# Patient Record
Sex: Female | Born: 1935 | Race: Black or African American | Hispanic: No | Smoking: Never smoker
Health system: Southern US, Community
[De-identification: ages and names within clinical notes are randomized; demographics above are authoritative.]

## PROBLEM LIST (undated history)

## (undated) DIAGNOSIS — I350 Nonrheumatic aortic (valve) stenosis: Secondary | ICD-10-CM

## (undated) DIAGNOSIS — I272 Pulmonary hypertension, unspecified: Secondary | ICD-10-CM

## (undated) DIAGNOSIS — N184 Chronic kidney disease, stage 4 (severe): Secondary | ICD-10-CM

## (undated) DIAGNOSIS — K922 Gastrointestinal hemorrhage, unspecified: Secondary | ICD-10-CM

## (undated) DIAGNOSIS — D649 Anemia, unspecified: Secondary | ICD-10-CM

## (undated) DIAGNOSIS — I5032 Chronic diastolic (congestive) heart failure: Secondary | ICD-10-CM

## (undated) DIAGNOSIS — R188 Other ascites: Secondary | ICD-10-CM

## (undated) DIAGNOSIS — M1990 Unspecified osteoarthritis, unspecified site: Secondary | ICD-10-CM

## (undated) DIAGNOSIS — I319 Disease of pericardium, unspecified: Secondary | ICD-10-CM

## (undated) DIAGNOSIS — R001 Bradycardia, unspecified: Secondary | ICD-10-CM

## (undated) DIAGNOSIS — I4891 Unspecified atrial fibrillation: Secondary | ICD-10-CM

## (undated) DIAGNOSIS — I38 Endocarditis, valve unspecified: Secondary | ICD-10-CM

## (undated) DIAGNOSIS — I10 Essential (primary) hypertension: Secondary | ICD-10-CM

## (undated) DIAGNOSIS — M810 Age-related osteoporosis without current pathological fracture: Secondary | ICD-10-CM

## (undated) HISTORY — DX: Unspecified osteoarthritis, unspecified site: M19.90

## (undated) HISTORY — PX: CATARACT EXTRACTION, BILATERAL: SHX1313

## (undated) HISTORY — DX: Chronic diastolic (congestive) heart failure: I50.32

## (undated) HISTORY — DX: Nonrheumatic aortic (valve) stenosis: I35.0

## (undated) HISTORY — DX: Pulmonary hypertension, unspecified: I27.20

## (undated) HISTORY — DX: Anemia, unspecified: D64.9

## (undated) HISTORY — DX: Bradycardia, unspecified: R00.1

## (undated) HISTORY — PX: VARICOSE VEIN SURGERY: SHX832

---

## 1997-07-01 ENCOUNTER — Other Ambulatory Visit: Admission: RE | Admit: 1997-07-01 | Discharge: 1997-07-01 | Payer: Self-pay | Admitting: Family Medicine

## 1999-10-11 ENCOUNTER — Encounter: Payer: Self-pay | Admitting: Family Medicine

## 1999-10-11 ENCOUNTER — Ambulatory Visit (HOSPITAL_COMMUNITY): Admission: RE | Admit: 1999-10-11 | Discharge: 1999-10-11 | Payer: Self-pay | Admitting: Family Medicine

## 2000-08-02 ENCOUNTER — Encounter: Admission: RE | Admit: 2000-08-02 | Discharge: 2000-10-31 | Payer: Self-pay | Admitting: Internal Medicine

## 2000-08-23 ENCOUNTER — Other Ambulatory Visit: Admission: RE | Admit: 2000-08-23 | Discharge: 2000-08-23 | Payer: Self-pay | Admitting: Internal Medicine

## 2000-10-11 ENCOUNTER — Ambulatory Visit (HOSPITAL_COMMUNITY): Admission: RE | Admit: 2000-10-11 | Discharge: 2000-10-11 | Payer: Self-pay | Admitting: Internal Medicine

## 2000-10-11 ENCOUNTER — Encounter: Payer: Self-pay | Admitting: Internal Medicine

## 2001-03-24 ENCOUNTER — Encounter: Admission: RE | Admit: 2001-03-24 | Discharge: 2001-06-22 | Payer: Self-pay | Admitting: Internal Medicine

## 2001-07-24 ENCOUNTER — Encounter: Admission: RE | Admit: 2001-07-24 | Discharge: 2001-10-22 | Payer: Self-pay | Admitting: Internal Medicine

## 2001-10-23 ENCOUNTER — Encounter: Admission: RE | Admit: 2001-10-23 | Discharge: 2002-01-13 | Payer: Self-pay | Admitting: Internal Medicine

## 2001-11-03 ENCOUNTER — Encounter: Payer: Self-pay | Admitting: Internal Medicine

## 2001-11-03 ENCOUNTER — Ambulatory Visit (HOSPITAL_COMMUNITY): Admission: RE | Admit: 2001-11-03 | Discharge: 2001-11-03 | Payer: Self-pay | Admitting: Internal Medicine

## 2002-05-14 ENCOUNTER — Encounter: Admission: RE | Admit: 2002-05-14 | Discharge: 2002-08-12 | Payer: Self-pay | Admitting: Internal Medicine

## 2002-11-11 ENCOUNTER — Ambulatory Visit (HOSPITAL_COMMUNITY): Admission: RE | Admit: 2002-11-11 | Discharge: 2002-11-11 | Payer: Self-pay | Admitting: Hematology and Oncology

## 2002-11-11 ENCOUNTER — Encounter: Payer: Self-pay | Admitting: Internal Medicine

## 2002-12-11 ENCOUNTER — Encounter: Payer: Self-pay | Admitting: Internal Medicine

## 2002-12-11 ENCOUNTER — Encounter: Admission: RE | Admit: 2002-12-11 | Discharge: 2002-12-11 | Payer: Self-pay | Admitting: Internal Medicine

## 2003-04-03 DIAGNOSIS — M810 Age-related osteoporosis without current pathological fracture: Secondary | ICD-10-CM

## 2003-04-03 DIAGNOSIS — D649 Anemia, unspecified: Secondary | ICD-10-CM

## 2003-04-03 HISTORY — DX: Age-related osteoporosis without current pathological fracture: M81.0

## 2003-04-03 HISTORY — DX: Anemia, unspecified: D64.9

## 2003-09-17 ENCOUNTER — Encounter: Admission: RE | Admit: 2003-09-17 | Discharge: 2003-09-17 | Payer: Self-pay | Admitting: Internal Medicine

## 2003-12-02 ENCOUNTER — Ambulatory Visit (HOSPITAL_COMMUNITY): Admission: RE | Admit: 2003-12-02 | Discharge: 2003-12-02 | Payer: Self-pay | Admitting: Internal Medicine

## 2004-03-02 DIAGNOSIS — I319 Disease of pericardium, unspecified: Secondary | ICD-10-CM

## 2004-03-02 HISTORY — DX: Disease of pericardium, unspecified: I31.9

## 2004-03-31 ENCOUNTER — Encounter (INDEPENDENT_AMBULATORY_CARE_PROVIDER_SITE_OTHER): Payer: Self-pay | Admitting: Cardiology

## 2004-03-31 ENCOUNTER — Inpatient Hospital Stay (HOSPITAL_COMMUNITY): Admission: EM | Admit: 2004-03-31 | Discharge: 2004-04-02 | Payer: Self-pay | Admitting: Emergency Medicine

## 2004-04-02 DIAGNOSIS — I38 Endocarditis, valve unspecified: Secondary | ICD-10-CM

## 2004-04-02 HISTORY — DX: Endocarditis, valve unspecified: I38

## 2004-12-13 ENCOUNTER — Ambulatory Visit (HOSPITAL_COMMUNITY): Admission: RE | Admit: 2004-12-13 | Discharge: 2004-12-13 | Payer: Self-pay | Admitting: Internal Medicine

## 2005-12-17 ENCOUNTER — Ambulatory Visit (HOSPITAL_COMMUNITY): Admission: RE | Admit: 2005-12-17 | Discharge: 2005-12-17 | Payer: Self-pay | Admitting: Internal Medicine

## 2006-12-19 ENCOUNTER — Ambulatory Visit (HOSPITAL_COMMUNITY): Admission: RE | Admit: 2006-12-19 | Discharge: 2006-12-19 | Payer: Self-pay | Admitting: Internal Medicine

## 2008-02-19 ENCOUNTER — Ambulatory Visit (HOSPITAL_COMMUNITY): Admission: RE | Admit: 2008-02-19 | Discharge: 2008-02-19 | Payer: Self-pay | Admitting: Internal Medicine

## 2009-02-22 ENCOUNTER — Ambulatory Visit (HOSPITAL_COMMUNITY): Admission: RE | Admit: 2009-02-22 | Discharge: 2009-02-22 | Payer: Self-pay | Admitting: Internal Medicine

## 2009-08-31 ENCOUNTER — Encounter (HOSPITAL_BASED_OUTPATIENT_CLINIC_OR_DEPARTMENT_OTHER): Admission: RE | Admit: 2009-08-31 | Discharge: 2009-11-24 | Payer: Self-pay | Admitting: Internal Medicine

## 2009-10-14 ENCOUNTER — Encounter: Admission: RE | Admit: 2009-10-14 | Discharge: 2009-10-14 | Payer: Self-pay | Admitting: Internal Medicine

## 2009-10-21 ENCOUNTER — Encounter: Admission: RE | Admit: 2009-10-21 | Discharge: 2009-10-21 | Payer: Self-pay | Admitting: Internal Medicine

## 2010-02-27 ENCOUNTER — Ambulatory Visit (HOSPITAL_COMMUNITY)
Admission: RE | Admit: 2010-02-27 | Discharge: 2010-02-27 | Payer: Self-pay | Source: Home / Self Care | Admitting: Internal Medicine

## 2010-07-25 ENCOUNTER — Ambulatory Visit
Admission: RE | Admit: 2010-07-25 | Discharge: 2010-07-25 | Disposition: A | Discharge status: Home / Self Care | Payer: Medicare Other | Source: Ambulatory Visit | Attending: Family Medicine | Admitting: Family Medicine

## 2010-07-25 ENCOUNTER — Other Ambulatory Visit: Payer: Self-pay | Admitting: Family Medicine

## 2010-07-25 DIAGNOSIS — R52 Pain, unspecified: Secondary | ICD-10-CM

## 2010-08-18 NOTE — Discharge Summary (Signed)
Meghan Russo, Meghan Russo NO.:  [id number]   MEDICAL RECORD NO.:  [id number]          PATIENT TYPE:  INP   LOCATION:  4735                         FACILITY:  [facility]   PHYSICIAN:  Nanetta Batty, M.D.   DATE OF BIRTH:  [birth year]   DATE OF ADMISSION:  03/31/2004  DATE OF DISCHARGE:  04/02/2004                                 DISCHARGE SUMMARY   ADMISSION DIAGNOSES:  1.  Chest pain.  2.  History of mild aortic stenosis and mild aortic insufficiency with mild      tricuspid regurgitation.  3.  Hypertension.  4.  Osteoporosis.   DISCHARGE DIAGNOSES:  1.  Pericarditis.  2.  History of mild aortic stenosis and mild aortic insufficiency with mild      tricuspid regurgitation.  3.  Hypertension.  4.  Osteoporosis.   PROCEDURE:  2-D echocardiogram.   BRIEF HISTORY:  The patient is an 77 year old black female who has a history  of valvular disease and hypertension but no other coronary artery disease.  She presented to the ER on 03/31/04 after having had some salad and had the  onset of chest pain.  It started in the right flank and went under her right  breast and extended into her sternal area.  She felt like it was indigestion  initially, but it remained constant, it increased with deep breaths.  There  was no radiation, no shortness of breath or nausea, no vomiting, no  diaphoresis.  She subsequently presented to the ER for evaluation.  She had  no chest discomfort prior to this the night before and no recent problems  with shortness of breath or dyspnea.   PAST MEDICAL HISTORY:  2-D echo 10/20/03 which showed an EF of 50-60%, mild  aortic stenosis, mild aortic insufficiency, and mild tricuspid  regurgitation.  She has never had a cardiac catheterization.  Cardiolite  study 11/02 was negative with some questionable anterior ischemia with an  ejection fraction of 61%.  She also has a history of hypertension,  osteoporosis but no surgeries.   CURRENT MEDICATIONS:  1.  Toprol XL 100 mg daily.  2.  Lisinopril/HCT 20/25 one daily.  3.  Diovan 320 mg daily.  4.  Aspirin 81 mg daily.  5.  Fosamax 70 mg weekly.   ALLERGIES:  None known.   SOCIAL HISTORY:  She lives with her 61 year old grandson, she does chores in  the house, cooks, cleans and drives.  She is very independent.   PHYSICAL EXAMINATION:  Negative except for a temperature of 99.5 to 100 and  a 4/6 systolic murmur at the right second intercostal space and 3/6 murmur  at the apex.   Her EKG on admission showed a sinus rhythm but she had ST elevations of  about 2 mm in leads 1, 2, 3, aVF, V1 through V6.  She was subsequently  admitted to rule out MI with concern that she had pericarditis.   HOSPITAL COURSE:  During her hospitalization she remained in sinus rhythm,  although she became very bradycardic, her low rate was in the mid 30s  and  she averaged heart rates between 40 and 60.  Her troponins were negative and  her CKs were negative.  Her TSH was normal at 0.0379.  A 2-D echo was  obtained which showed the aortic valve was mildly calcified, there was  reduced aortic valve leaflet excursion, findings were consistent with mild  aortic valve stenosis, there was moderate to mild aortic valvular  regurgitation, the mean transaortic valve gradient was 8.4 mmHg, estimated  aortic valve area was 2.197 cm, there was mild annular calcification and  mild mitral valvular regurgitation, left atrial size was normal limits.  The  ejection fraction was 65 to 75% with ventricular wall thickness moderately  increased and there was moderate asymmetric septal hypertrophy.  Because the  patient was thought to have pericarditis she was treated initially with  Indocin and this was converted to Motrin on 04/01/04.  By the following  morning, 04/02/04, her chest pain was completely resolved and her discomfort  with deep inspiration was gone.  She continued to have 2-3/6 systolic and  apex murmurs.  She was  mobilized, she was seen by Dr. Elsie Lincoln and it was his  opinion that she could be discharged home.  We will discharge her home on  her preadmission medications as before.  In addition, we have recommended  Motrin 200 mg, two q.6h. p.r.n.  She is to maintain light activity for 72  hours and then back to her normal activities, a low salt diet.  She is to  call Dr. Dorothyann Peng for followup and she will follow up with Dr. Allyson Sabal in  about 2 to 3 weeks.   Some labs were obtained to evaluate her pericarditis, including an ANA which  is not back yet.  Rheumatoid factor was less than 20 with the normal being 0-  20.  Sed rate was 35.  Her CBC showed a [place] count of 7000, hemoglobin 10,  hematocrit 30, and platelet count 172,000.  Urine culture was also obtained  and it grew out 2000 colonies which was considered insignificant.  UA on  admission was essentially negative.  TSH was 0.379.   CONDITION ON DISCHARGE:  Improved.      WDJ/MEDQ  D:  04/02/2004  T:  04/02/2004  Job:  [id number]   cc:   Candyce Churn. Allyne Gee, M.D.  [address]  [address]  [place]  [place] [address]  Fax: [phone]

## 2010-12-15 ENCOUNTER — Emergency Department (HOSPITAL_COMMUNITY)
Admission: EM | Admit: 2010-12-15 | Discharge: 2010-12-16 | Disposition: A | Discharge status: Home / Self Care | Payer: Medicare Other | Attending: Emergency Medicine | Admitting: Emergency Medicine

## 2010-12-15 DIAGNOSIS — H53139 Sudden visual loss, unspecified eye: Secondary | ICD-10-CM | POA: Insufficient documentation

## 2010-12-15 DIAGNOSIS — Z9889 Other specified postprocedural states: Secondary | ICD-10-CM | POA: Insufficient documentation

## 2010-12-15 DIAGNOSIS — I10 Essential (primary) hypertension: Secondary | ICD-10-CM | POA: Insufficient documentation

## 2011-02-27 ENCOUNTER — Other Ambulatory Visit (HOSPITAL_COMMUNITY): Payer: Self-pay | Admitting: Internal Medicine

## 2011-02-27 DIAGNOSIS — Z1231 Encounter for screening mammogram for malignant neoplasm of breast: Secondary | ICD-10-CM

## 2011-03-08 ENCOUNTER — Encounter (HOSPITAL_BASED_OUTPATIENT_CLINIC_OR_DEPARTMENT_OTHER): Payer: Medicare Other | Attending: Internal Medicine

## 2011-03-08 DIAGNOSIS — L97809 Non-pressure chronic ulcer of other part of unspecified lower leg with unspecified severity: Secondary | ICD-10-CM | POA: Insufficient documentation

## 2011-03-08 DIAGNOSIS — L97509 Non-pressure chronic ulcer of other part of unspecified foot with unspecified severity: Secondary | ICD-10-CM | POA: Insufficient documentation

## 2011-03-08 DIAGNOSIS — I872 Venous insufficiency (chronic) (peripheral): Secondary | ICD-10-CM | POA: Insufficient documentation

## 2011-03-08 NOTE — Progress Notes (Signed)
[facility]  NAME:  Meghan Russo, Meghan Russo NO.:  [id number]  MEDICAL RECORD NO.:  [id number]      DATE OF BIRTH:  [birth year]  PHYSICIAN:  Maxwell Caul, M.D. VISIT DATE:  03/08/2011                                  OFFICE VISIT   LOCATION:  [facility].  [name] is an 77 year old woman we actually know from previous visits to this clinic with venous stasis ulcerations.  She tells [place] she was reasonably well as far as wounds are concerned until 2 weeks ago when she noted opening of wound on her medial right leg as well as right lateral foot.  Both of these were nontraumatic.  She is not a diabetic. She has not had undue pain or fever.  She is here for our review of this.  She has had previous difficulties with venous stasis wounds, as mentioned, was last in this clinic 2 years ago.  I do not have these records.  She does not have a history of arterial insufficiency.  Her ABI measured today was 1 in this clinic.  On examination; temperature is 98.8, pulse 64, respirations 18, blood pressure is 114/64.  The wound on the right medial lower extremity is the most substantial measuring 1.2 x 1.7 x 0.3.  This had a tight adherent eschar.  This required debridement with topical lidocaine and a small curette.  The other area was on the right lateral foot measuring 1 x 0.1 x 0.1.  Underwent a similar nonexcisional debridement which she tolerated well.  There is no evidence of infection here.  No cultures were done.  IMPRESSION:  New-onset venous stasis wound as noted above.  The patient has a prior history of venous stasis wounds and has a history of vein surgery in 1993.  These wounds were debrided as noted above.  We applied Santyl, hydrogel, Adaptic foam under a Profore Lite dressing which she will leave on for a week.  We will see her again at that time.          ______________________________ Maxwell Caul,  M.D.     MGR/MEDQ  D:  03/08/2011  T:  03/08/2011  Job:  [id number]

## 2011-04-04 ENCOUNTER — Ambulatory Visit (HOSPITAL_COMMUNITY)
Admission: RE | Admit: 2011-04-04 | Discharge: 2011-04-04 | Disposition: A | Discharge status: Home / Self Care | Payer: Medicare Other | Source: Ambulatory Visit | Attending: Internal Medicine | Admitting: Internal Medicine

## 2011-04-04 DIAGNOSIS — Z1231 Encounter for screening mammogram for malignant neoplasm of breast: Secondary | ICD-10-CM

## 2011-04-05 ENCOUNTER — Encounter (HOSPITAL_BASED_OUTPATIENT_CLINIC_OR_DEPARTMENT_OTHER): Payer: Medicare Other | Attending: Internal Medicine

## 2011-04-05 DIAGNOSIS — L97509 Non-pressure chronic ulcer of other part of unspecified foot with unspecified severity: Secondary | ICD-10-CM | POA: Insufficient documentation

## 2011-04-05 DIAGNOSIS — L97809 Non-pressure chronic ulcer of other part of unspecified lower leg with unspecified severity: Secondary | ICD-10-CM | POA: Insufficient documentation

## 2011-04-05 DIAGNOSIS — I872 Venous insufficiency (chronic) (peripheral): Secondary | ICD-10-CM | POA: Insufficient documentation

## 2011-05-10 ENCOUNTER — Encounter (HOSPITAL_BASED_OUTPATIENT_CLINIC_OR_DEPARTMENT_OTHER): Payer: Medicare Other | Attending: Internal Medicine

## 2011-05-10 DIAGNOSIS — L98499 Non-pressure chronic ulcer of skin of other sites with unspecified severity: Secondary | ICD-10-CM | POA: Insufficient documentation

## 2011-05-10 DIAGNOSIS — I872 Venous insufficiency (chronic) (peripheral): Secondary | ICD-10-CM | POA: Insufficient documentation

## 2011-05-13 ENCOUNTER — Encounter (HOSPITAL_COMMUNITY): Payer: Self-pay

## 2011-05-13 ENCOUNTER — Emergency Department (INDEPENDENT_AMBULATORY_CARE_PROVIDER_SITE_OTHER)
Admission: EM | Admit: 2011-05-13 | Discharge: 2011-05-13 | Disposition: A | Discharge status: Home / Self Care | Payer: Medicare Other | Source: Home / Self Care

## 2011-05-13 DIAGNOSIS — B353 Tinea pedis: Secondary | ICD-10-CM

## 2011-05-13 HISTORY — DX: Essential (primary) hypertension: I10

## 2011-05-13 MED ORDER — TERBINAFINE HCL 250 MG PO TABS: 250.0000 mg | ORAL_TABLET | Freq: Every day | ORAL | Status: DC

## 2011-05-13 MED ORDER — CLOTRIMAZOLE-BETAMETHASONE 1-0.05 % EX CREA: TOPICAL_CREAM | CUTANEOUS | Status: DC

## 2011-05-13 NOTE — ED Notes (Signed)
Pt is being treated at the wound center for lower leg ulcers and on Thursday she had what she describes as a rash on both feet.  She now has weeping lesions on both feet, denies pain.

## 2011-05-13 NOTE — ED Provider Notes (Signed)
History     CSN: [id number]  Arrival date & time 05/13/11  1314   None     Chief Complaint  Patient presents with  . Rash    (Consider location/radiation/quality/duration/timing/severity/associated sxs/prior treatment) HPI Comments: Patient presents today with complaints of an itchy weeping rash to both feet. She is going to the wound clinic for ulcers of her lower extremities. They are wrapping the wounds. She has not taken any antibiotics. She states that she developed an itchy rash on the tops of her feet a few days ago. She showed this to the provider at the wound clinic who told her that it was either a rash or a fungal infection. Since this began it has spread and is now continuously weeping. No pain, or fever. She is not diabetic.    Past Medical History  Diagnosis Date  . Hypertension     History reviewed. No pertinent past surgical history.  History reviewed. No pertinent family history.  History  Substance Use Topics  . Smoking status: Never Smoker   . Smokeless tobacco: Not on file  . Alcohol Use: No    OB History    Grav Para Term Preterm Abortions TAB SAB Ect Mult Living                  Review of Systems  Constitutional: Negative for fever and chills.  Musculoskeletal: Negative for joint swelling.  Skin: Positive for rash.    Allergies  Review of patient's allergies indicates no known allergies.  Home Medications   Current Outpatient Rx  Name Route Sig Dispense Refill  . AMLODIPINE-OLMESARTAN 10-40 MG PO TABS Oral Take 1 tablet by mouth daily.    Marland Kitchen CARVEDILOL PHOSPHATE ER 20 MG PO CP24 Oral Take 20 mg by mouth daily.    . FUROSEMIDE 20 MG PO TABS Oral Take 20 mg by mouth 2 (two) times daily.    Marland Kitchen HYDRALAZINE HCL 25 MG PO TABS Oral Take 25 mg by mouth 3 (three) times daily.    Marland Kitchen LISINOPRIL-HYDROCHLOROTHIAZIDE 20-25 MG PO TABS Oral Take 1 tablet by mouth daily.    Marland Kitchen CLOTRIMAZOLE-BETAMETHASONE 1-0.05 % EX CREA  Apply to affected area 2 times daily  prn 15 g 0  . TERBINAFINE HCL 250 MG PO TABS Oral Take 1 tablet (250 mg total) by mouth daily. 7 tablet 0    BP 192/65  Pulse 83  Temp(Src) 98.3 F (36.8 C) (Oral)  Resp 18  SpO2 100%  Physical Exam  Nursing note and vitals reviewed. Constitutional: She appears well-developed and well-nourished. No distress.  Musculoskeletal: Normal range of motion. She exhibits no edema and no tenderness.       Bilat LE wrapped.   Neurological: Gait normal.  Skin: Skin is warm and dry.          Dorsal aspect of bilat feet near MTP joints, papular and scabbed, and weeping serous fluid. Interdigital spaces macerated also with serous fluid. Plantar aspect of feet clear.   Psychiatric: She has a normal mood and affect.    ED Course  Procedures (including critical care time)  Labs Reviewed - No data to display No results found.   1. Tinea pedis       MDM  Tinea infection of bilat feet. Also examined by Dr Artis Flock. And treatment plan discussed.          Melody Comas, [place] 05/13/11 [phone]

## 2011-05-14 NOTE — ED Provider Notes (Signed)
Medical screening examination/treatment/procedure(s) were performed by non-physician practitioner and as supervising physician I was immediately available for consultation/collaboration.   Anntoinette Haefele DOUGLAS MD.    Shawon Denzer Douglas Latoy Labriola, MD 05/14/11 1546 

## 2011-05-16 LAB — WOUND CULTURE: Gram Stain: NONE SEEN

## 2011-05-17 ENCOUNTER — Telehealth (HOSPITAL_COMMUNITY): Payer: Self-pay | Admitting: *Deleted

## 2011-05-17 NOTE — ED Notes (Signed)
Wound culture L foot: Abundant MRSA. Order obtained from [facility] PA for Septra DS #20 1 BID x 10 days. Pt. and daughter notified.  MRSA instructions given. Daughter wants Rx. Called to [facility] on [address] @ [phone].  Rx. called to voicemail after holding 5 min. and then hang up. Vassie Moselle 05/17/2011

## 2011-05-17 NOTE — ED Notes (Signed)
Pos. MRSA. Pt. verified, notified of results and given instructions. Rx. Septra DS called. Meghan Russo 05/17/2011

## 2011-06-07 ENCOUNTER — Encounter (HOSPITAL_BASED_OUTPATIENT_CLINIC_OR_DEPARTMENT_OTHER): Payer: Medicare Other | Attending: Internal Medicine

## 2011-06-07 DIAGNOSIS — I872 Venous insufficiency (chronic) (peripheral): Secondary | ICD-10-CM | POA: Insufficient documentation

## 2011-06-07 DIAGNOSIS — L98499 Non-pressure chronic ulcer of skin of other sites with unspecified severity: Secondary | ICD-10-CM | POA: Insufficient documentation

## 2011-06-14 ENCOUNTER — Emergency Department (INDEPENDENT_AMBULATORY_CARE_PROVIDER_SITE_OTHER): Payer: Medicare Other

## 2011-06-14 ENCOUNTER — Emergency Department (INDEPENDENT_AMBULATORY_CARE_PROVIDER_SITE_OTHER)
Admission: EM | Admit: 2011-06-14 | Discharge: 2011-06-14 | Disposition: A | Discharge status: Home / Self Care | Payer: Medicare Other | Source: Home / Self Care | Attending: Emergency Medicine | Admitting: Emergency Medicine

## 2011-06-14 ENCOUNTER — Encounter (HOSPITAL_COMMUNITY): Payer: Self-pay | Admitting: *Deleted

## 2011-06-14 DIAGNOSIS — S01512A Laceration without foreign body of oral cavity, initial encounter: Secondary | ICD-10-CM

## 2011-06-14 DIAGNOSIS — S0083XA Contusion of other part of head, initial encounter: Secondary | ICD-10-CM

## 2011-06-14 DIAGNOSIS — S1093XA Contusion of unspecified part of neck, initial encounter: Secondary | ICD-10-CM

## 2011-06-14 DIAGNOSIS — S01502A Unspecified open wound of oral cavity, initial encounter: Secondary | ICD-10-CM

## 2011-06-14 DIAGNOSIS — Z23 Encounter for immunization: Secondary | ICD-10-CM

## 2011-06-14 DIAGNOSIS — S0003XA Contusion of scalp, initial encounter: Secondary | ICD-10-CM

## 2011-06-14 MED ORDER — AMOXICILLIN 500 MG PO CAPS: 500.0000 mg | ORAL_CAPSULE | Freq: Three times a day (TID) | ORAL | Status: AC

## 2011-06-14 MED ORDER — TETANUS-DIPHTH-ACELL PERTUSSIS 5-2.5-18.5 LF-MCG/0.5 IM SUSP
0.5000 mL | Freq: Once | INTRAMUSCULAR | Status: AC
  Administered 2011-06-14: 0.5 mL via INTRAMUSCULAR

## 2011-06-14 MED ORDER — TETANUS-DIPHTH-ACELL PERTUSSIS 5-2.5-18.5 LF-MCG/0.5 IM SUSP
INTRAMUSCULAR | Status: AC
  Filled 2011-06-14: qty 0.5

## 2011-06-14 NOTE — ED Provider Notes (Signed)
Chief Complaint  Patient presents with  . Fall    History of Present Illness:  Meghan Russo is an 78 year old female who last night around midnight tripped and fell, striking her chin on the floor. There was no loss of consciousness. She states she is certain that this was just a trip and not a syncope episode. She remained conscious throughout the entire episode and has a good memory for what happened. She has some swelling and bruising over her chin and a small abrasion of the chin and a cut inside her mouth. This is the main extent of her injuries. She denies any headache or stiff neck. She thinks she might have injured her right arm somewhat also but right now she denies any pain and she has a full range of motion with no pain. She denies any injury to the chest, back, abdomen, or extremities otherwise. She denies any diplopia, blurred vision, bleeding from the nose or ears, paresthesias, numbness, weakness, or difficulty with speech, swallowing, or ambulation. No history of loss of consciousness or seizure activity. No nausea or vomiting. She denies any pain with opening or closing the mouth and has had no chipped, loose, or broken teeth.  Review of Systems:  Other than noted above, the patient denies any of the following symptoms: Systemic:  No fever or chills. Eye:  No eye pain, redness, diplopia or blurred vision ENT:  No bleeding from nose or ears.  No loose or broken teeth. Neck:  No pain or limited ROM. GI:  No nausea or vomiting. Neuro:  No loss of consciousness, seizure activity, numbness, tingling, or weakness.  PMFSH:  Past medical history, family history, social history, meds, and allergies were reviewed.  Physical Exam:   Vital signs:  BP 184/65  Pulse 94  Temp(Src) 98 F (36.7 C) (Oral)  Resp 20  SpO2 99% General:  Alert and oriented times 3.  In no distress. Eye:  PERRL, full EOMs.  Lids and conjunctivas normal. HEENT:  External exam reveals a bruise over the anterior  mandible mostly on the left and abrasion on the right. This is mildly tender to touch, but the TMJ areas are nontender in the jaw has full range of motion with no pain. Examine the inside of the mouth there is a 1 cm laceration which is gaping open slightly on the lower lip. No other intraoral lesions and no chipped, loose, or broken teeth.  TMs and canals normal, nasal mucosa normal.  No oral lacerations.  Teeth were intact without obvious oral trauma. Neck:  Non tender.  Full ROM without pain. Neurological:  Alert and oriented.  Cranial nerves intact.  No pronator drift.  No muscle weakness.  Sensation was intact to light touch. Gait was normal.  Procedure: Verbal informed consent was obtained.  The patient was informed of the risks and benefits of the procedure and understands and accepts.  Identity of the patient was verified verbally and by wristband.   The laceration area described above was anesthetized with 3 mL of 2% Xylocaine with epinephrine.  The wound was then closed as follows:  The edges were loosely approximated with 3 4-0 Vicryl Rapide sutures.  There were no immediate complications, and the patient tolerated the procedure well. The laceration was then cleansed, Bacitracin ointment was applied to the abrasion of the chin and a clean, dry pressure dressing was put on the abrasion on the chin, the laceration and the mouth was just left open.  Dg Mandible 1-3 Views  06/14/2011  *RADIOLOGY REPORT*  Clinical Data: Pain after fall  MANDIBLE - 1-3 VIEW  Comparison: None.  Findings: There is no evidence of mandibular fracture or dislocation.  IMPRESSION: Negative mandible.  Original Report Authenticated By: Brandon Melnick, M.D.    Medications given in [facility]:  She was given a DTaP vaccine and tolerated this well without any immediate side effects or reactions.  Assessment:   Diagnoses that have been ruled out:  None  Diagnoses that are still under consideration:  None  Final diagnoses:    Laceration of oral cavity  Contusion of jaw    Plan:   1.  The following meds were prescribed:   New Prescriptions   AMOXICILLIN (AMOXIL) 500 MG CAPSULE    Take 1 capsule (500 mg total) by mouth 3 (three) times daily.   2.  The patient was instructed in wound care and pain control, and handouts were given. 3.  The patient was told that the stitches should come out on their own and he would not need to be removed. She was instructed in wound care and should return if there is any sign of wound infection.    Reuben Likes, MD 06/14/11 [phone]

## 2011-06-14 NOTE — ED Notes (Signed)
Pt is here post fall last night.  Pt hit her chin.  Has a wounds to outer chin and cut mark from teeth inside bottom lip.  Area is swollen and bruised.  Pt states she tripped.

## 2011-06-14 NOTE — Discharge Instructions (Signed)
Absorbable Suture Repair Absorbable sutures (stitches) hold skin together so you can heal. Keep skin wounds clean and dry for the next 2 to 3 days. Then, you may gently wash your wound and dress it with an antibiotic ointment as recommended. As your wound begins to heal, the sutures are no longer needed, and they typically begin to fall off. This will take 7 to 10 days. After 10 days, if your sutures are loose, you can remove them by wiping with a clean gauze pad or a cotton ball. Do not pull your sutures out. They should wipe away easily. If after 10 days they do not easily wipe away, have your caregiver take them out. Absorbable sutures may be used deep in a wound to help hold it together. If these stitches are below the skin, the body will absorb them completely in 3 to 4 weeks.  You may need a tetanus shot if:  You cannot remember when you had your last tetanus shot.   You have never had a tetanus shot.  If you get a tetanus shot, your arm may swell, get red, and feel warm to the touch. This is common and not a problem. If you need a tetanus shot and you choose not to have one, there is a rare chance of getting tetanus. Sickness from tetanus can be serious. SEEK IMMEDIATE MEDICAL CARE IF:  You have redness in the wound area.   The wound area feels hot to the touch.   You develop swelling in the wound area.   You develop pain.   There is fluid drainage from the wound.  Document Released: 04/26/2004 Document Revised: 03/08/2011 Document Reviewed: 08/08/2010 [facility] Patient Information 2012 [place], [place].Contusion A contusion is a deep bruise. Contusions are the result of an injury that caused bleeding under the skin. The contusion may turn blue, purple, or yellow. Minor injuries will give you a painless contusion, but more severe contusions may stay painful and swollen for a few weeks.  CAUSES  A contusion is usually caused by a blow, trauma, or direct force to an area of the  body. SYMPTOMS   Swelling and redness of the injured area.   Bruising of the injured area.   Tenderness and soreness of the injured area.   Pain.  DIAGNOSIS  The diagnosis can be made by taking a history and physical exam. An X-ray, CT scan, or MRI may be needed to determine if there were any associated injuries, such as fractures. TREATMENT  Specific treatment will depend on what area of the body was injured. In general, the best treatment for a contusion is resting, icing, elevating, and applying cold compresses to the injured area. Over-the-counter medicines may also be recommended for pain control. Ask your caregiver what the best treatment is for your contusion. HOME CARE INSTRUCTIONS   Put ice on the injured area.   Put ice in a plastic bag.   Place a towel between your skin and the bag.   Leave the ice on for 15 to 20 minutes, 3 to 4 times a day.   Only take over-the-counter or prescription medicines for pain, discomfort, or fever as directed by your caregiver. Your caregiver may recommend avoiding anti-inflammatory medicines (aspirin, ibuprofen, and naproxen) for 48 hours because these medicines may increase bruising.   Rest the injured area.   If possible, elevate the injured area to reduce swelling.  SEEK IMMEDIATE MEDICAL CARE IF:   You have increased bruising or swelling.   You  have pain that is getting worse.   Your swelling or pain is not relieved with medicines.  MAKE SURE YOU:   Understand these instructions.   Will watch your condition.   Will get help right away if you are not doing well or get worse.  Document Released: 12/27/2004 Document Revised: 03/08/2011 Document Reviewed: 01/22/2011 [facility] Patient Information 2012 [place], [place].    Rinse out mouth with warm salt water (1/2 tsp of table salt in 8 oz warm water) 3 times daily.

## 2011-07-18 ENCOUNTER — Encounter (HOSPITAL_COMMUNITY): Payer: Self-pay | Admitting: Emergency Medicine

## 2011-07-18 ENCOUNTER — Emergency Department (INDEPENDENT_AMBULATORY_CARE_PROVIDER_SITE_OTHER)
Admission: EM | Admit: 2011-07-18 | Discharge: 2011-07-18 | Disposition: A | Discharge status: Home / Self Care | Payer: Medicare Other | Source: Home / Self Care | Attending: Emergency Medicine | Admitting: Emergency Medicine

## 2011-07-18 DIAGNOSIS — M1710 Unilateral primary osteoarthritis, unspecified knee: Secondary | ICD-10-CM

## 2011-07-18 MED ORDER — MELOXICAM 7.5 MG PO TABS: 7.5000 mg | ORAL_TABLET | Freq: Every day | ORAL | Status: DC

## 2011-07-18 NOTE — ED Provider Notes (Signed)
History     CSN: [id number]  Arrival date & time 07/18/11  6295   First MD Initiated Contact with Patient 07/18/11 1210      Chief Complaint  Patient presents with  . Joint Swelling    (Consider location/radiation/quality/duration/timing/severity/associated sxs/prior treatment) HPI Comments: Patient is accompanied by her daughter which describes mainly her symptoms and changes. She has been complaining that both of her knees have been hurting more and during the course of the day they become swollen. She has not sustained any falls or injuries and usually describes that the swelling is more obvious in the course of the day and as she lays down at night and during the morning the swelling seemed to have improved. Have not brought this to the attention of her Dr. so far. Denies any recent falls injuries, fevers or lower extremity weakness.  Patient is a 78 y.o. female presenting with knee pain. The history is provided by the patient and a relative.  Knee Pain This is a recurrent problem. The current episode started more than 1 week ago. The problem occurs constantly. Pertinent negatives include no abdominal pain. The symptoms are aggravated by walking, stress and standing. The symptoms are relieved by nothing.    Past Medical History  Diagnosis Date  . Hypertension     History reviewed. No pertinent past surgical history.  History reviewed. No pertinent family history.  History  Substance Use Topics  . Smoking status: Never Smoker   . Smokeless tobacco: Not on file  . Alcohol Use: No    OB History    Grav Para Term Preterm Abortions TAB SAB Ect Mult Living                  Review of Systems  Constitutional: Positive for activity change. Negative for fever, fatigue and unexpected weight change.  Gastrointestinal: Negative for abdominal pain.  Musculoskeletal: Positive for joint swelling and gait problem. Negative for back pain and arthralgias.    Allergies  Review of  patient's allergies indicates no known allergies.  Home Medications   Current Outpatient Rx  Name Route Sig Dispense Refill  . ASPIRIN 81 MG PO TABS Oral Take 81 mg by mouth daily.    Marland Kitchen AMLODIPINE-OLMESARTAN 10-40 MG PO TABS Oral Take 1 tablet by mouth daily.    Marland Kitchen CARVEDILOL PHOSPHATE ER 20 MG PO CP24 Oral Take 20 mg by mouth daily.    Marland Kitchen CLOTRIMAZOLE-BETAMETHASONE 1-0.05 % EX CREA  Apply to affected area 2 times daily prn 15 g 0  . FUROSEMIDE 20 MG PO TABS Oral Take 20 mg by mouth 2 (two) times daily.    Marland Kitchen HYDRALAZINE HCL 25 MG PO TABS Oral Take 25 mg by mouth 3 (three) times daily.    Marland Kitchen LISINOPRIL-HYDROCHLOROTHIAZIDE 20-25 MG PO TABS Oral Take 1 tablet by mouth daily.    . TERBINAFINE HCL 250 MG PO TABS Oral Take 1 tablet (250 mg total) by mouth daily. 7 tablet 0    BP 197/60  Pulse 68  Temp(Src) 97.9 F (36.6 C) (Oral)  Resp 16  SpO2 98%  Physical Exam  Nursing note and vitals reviewed. Constitutional: No distress.  HENT:  Head: Normocephalic.  Eyes: Conjunctivae are normal.  Musculoskeletal: She exhibits tenderness.       Right knee: She exhibits swelling. She exhibits normal range of motion, no ecchymosis, no erythema, normal alignment, no LCL laxity, normal patellar mobility, no bony tenderness and no MCL laxity. tenderness found. Medial joint line,  lateral joint line and patellar tendon tenderness noted.       Left knee: She exhibits decreased range of motion and swelling. She exhibits no ecchymosis, no deformity, no laceration, no erythema, normal alignment, no LCL laxity, normal patellar mobility, no bony tenderness, normal meniscus and no MCL laxity. tenderness found. Medial joint line, lateral joint line and patellar tendon tenderness noted.  Skin: Skin is warm. No rash noted. She is not diaphoretic. No erythema.    ED Course  Procedures (including critical care time)  Labs Reviewed - No data to display No results found.   No diagnosis found.    MDM  Bilateral  knee pain in swelling for about 2 weeks. Swelling is reported as in term attempt progressive during the day improved in the mornings. Some discomfort with weight-bearing activities and long walks. Have encouraged patient to talk to primary care Dr. to consider a cycle of physical therapy. Most likely changes related to degenerative changes. No acute injury, nor exam suggest a acute fracture, dislocation or significant care. On patient's exam knee seem to be pretty stable and with no laxity concerns. No signs of septic knee or localize infection. Daughter accompanied mother during urgent care and counter. Have expressive explained to her that would be most ideal to discuss this further with her primary care Dr. to consider physical therapy and customize knee sleeves for mild compression        Jimmie Molly, MD 07/18/11 1219

## 2011-07-18 NOTE — ED Notes (Signed)
Pt c/o knee swelling starting about 2 weeks ago. She denies any pain or injury. Pt reports swelling is gone at night but appears every day and elevation does not help.

## 2011-07-18 NOTE — Discharge Instructions (Signed)
As discussed bring this to the attention of her primary care doctor as a physical therapy consult might be warranted. Because not to take any over-the-counter medicines if you take this medication for discomfort (only one week)Wear and Tear Disorders of the Knee (Arthritis, Osteoarthritis) Everyone will experience wear and tear injuries (arthritis, osteoarthritis) of the knee. These are the changes we all get as we age. They come from the joint stress of daily living. The amount of cartilage damage in your knee and your symptoms determine if you need surgery. Mild problems require approximately two months recovery time. More severe problems take several months to recover. With mild problems, your surgeon may find worn and rough cartilage surfaces. With severe changes, your surgeon may find cartilage that has completely worn away and exposed the bone. Loose bodies of bone and cartilage, bone spurs (excess bone growth), and injuries to the menisci (cushions between the large bones of your leg) are also common. All of these problems can cause pain. For a mild wear and tear problem, rough cartilage may simply need to be shaved and smoothed. For more severe problems with areas of exposed bone, your surgeon may use an instrument for roughing up the bone surfaces to stimulate new cartilage growth. Loose bodies are usually removed. Torn menisci may be trimmed or repaired. ABOUT THE ARTHROSCOPIC PROCEDURE Arthroscopy is a surgical technique. It allows your orthopedic surgeon to diagnose and treat your knee injury with accuracy. The surgeon looks into your knee through a small scope. The scope is like a small (pencil-sized) telescope. Arthroscopy is less invasive than open knee surgery. You can expect a more rapid recovery. After the procedure, you will be moved to a recovery area until most of the effects of the medication have worn off. Your caregiver will discuss the test results with you. RECOVERY The severity of  the arthritis and the type of procedure performed will determine recovery time. Other important factors include age, physical condition, medical conditions, and the type of rehabilitation program. Strengthening your muscles after arthroscopy helps guarantee a better recovery. Follow your caregiver's instructions. Use crutches, rest, elevate, ice, and do knee exercises as instructed. Your caregivers will help you and instruct you with exercises and other physical therapy required to regain your mobility, muscle strength, and functioning following surgery. Only take over-the-counter or prescription medicines for pain, discomfort, or fever as directed by your caregiver.  SEEK MEDICAL CARE IF:   There is increased bleeding (more than a small spot) from the wound.   You notice redness, swelling, or increasing pain in the wound.   Pus is coming from wound.   You develop an unexplained oral temperature above 102 F (38.9 C) , or as your caregiver suggests.   You notice a foul smell coming from the wound or dressing.   You have severe pain with motion of the knee.  SEEK IMMEDIATE MEDICAL CARE IF:   You develop a rash.   You have difficulty breathing.   You have any allergic problems.  MAKE SURE YOU:   Understand these instructions.   Will watch your condition.   Will get help right away if you are not doing well or get worse.  Document Released: 03/16/2000 Document Revised: 03/08/2011 Document Reviewed: 08/13/2007 [facility] Patient Information 2012 [place], [place].   Degenerative Arthritis You have osteoarthritis. This is the wear and tear arthritis that comes with aging. It is also called degenerative arthritis. This is common in people past middle age. It is caused by  stress on the joints. The large weight bearing joints of the lower extremities are most often affected. The knees, hips, back, neck, and hands can become painful, swollen, and stiff. This is the most common type of arthritis.  It comes on with age, carrying too much weight, or from an injury. Treatment includes resting the sore joint until the pain and swelling improve. Crutches or a walker may be needed for severe flares. Only take over-the-counter or prescription medicines for pain, discomfort, or fever as directed by your caregiver. Local heat therapy may improve motion. Cortisone shots into the joint are sometimes used to reduce pain and swelling during flares. Osteoarthritis is usually not crippling and progresses slowly. There are things you can do to decrease pain:  Avoid high impact activities.   Exercise regularly.   Low impact exercises such as walking, biking and swimming help to keep the muscles strong and keep normal joint function.   Stretching helps to keep your range of motion.   Lose weight if you are overweight. This reduces joint stress.  In severe cases when you have pain at rest or increasing disability, joint surgery may be helpful. See your caregiver for follow-up treatment as recommended.  SEEK IMMEDIATE MEDICAL CARE IF:   You have severe joint pain.   Marked swelling and redness in your joint develops.   You develop a high fever.  Document Released: 03/19/2005 Document Revised: 03/08/2011 Document Reviewed: 08/19/2006 [facility] Patient Information 2012 [place], [place].

## 2011-08-01 ENCOUNTER — Encounter (INDEPENDENT_AMBULATORY_CARE_PROVIDER_SITE_OTHER): Payer: Self-pay | Admitting: Surgery

## 2011-08-01 HISTORY — PX: INGUINAL LYMPH NODE BIOPSY: SHX5865

## 2011-08-09 ENCOUNTER — Encounter (INDEPENDENT_AMBULATORY_CARE_PROVIDER_SITE_OTHER): Payer: Self-pay | Admitting: Surgery

## 2011-08-09 ENCOUNTER — Ambulatory Visit (INDEPENDENT_AMBULATORY_CARE_PROVIDER_SITE_OTHER): Payer: Medicare Other | Admitting: Surgery

## 2011-08-09 VITALS — BP 178/68 | HR 82 | Resp 18 | Ht 66.0 in | Wt 179.4 lb

## 2011-08-09 DIAGNOSIS — R590 Localized enlarged lymph nodes: Secondary | ICD-10-CM | POA: Insufficient documentation

## 2011-08-09 DIAGNOSIS — R599 Enlarged lymph nodes, unspecified: Secondary | ICD-10-CM

## 2011-08-09 NOTE — Progress Notes (Signed)
Patient ID: Meghan Russo, female   DOB: [birth year], 78 y.o.   MRN: [id number]  Chief Complaint  Patient presents with  . Other    Eval Bilateral groin adenopathy    HPI Meghan Russo is a 78 y.o. female.  Referred by Dr. Dorothyann Peng for evaluation of groin lymphadenopathy HPI This is an 78 year old female with some medical comorbidities who has recently had a lot of swelling and pain in both knees.  This has been present for about one month.  She underwent venous dopplers showed no evidence of DVT, but did not significant bilateral inguinal lymphadenopathy with the largest measuring 5.7 x 2 cm on the right side.  She presents now for surgical consultation.  She denies any unexplained weight loss, night sweats.   Past Medical History  Diagnosis Date  . Hypertension   . Knee pain   . Edema   . Chronic kidney disease, stage III (moderate)   . Benign hypertensive kidney disease with chronic kidney disease stage I through stage IV, or unspecified     Past Surgical History  Procedure Date  . Vericose vein surgery     History reviewed. No pertinent family history.  Social History History  Substance Use Topics  . Smoking status: Never Smoker   . Smokeless tobacco: Not on file  . Alcohol Use: No    Allergies  Allergen Reactions  . Asa (Aspirin) Rash    Current Outpatient Prescriptions  Medication Sig Dispense Refill  . alendronate (FOSAMAX) 70 MG tablet Take 70 mg by mouth every 7 (seven) days. Take with a full glass of water on an empty stomach.      Marland Kitchen amLODipine-olmesartan (AZOR) 10-40 MG per tablet Take 1 tablet by mouth daily.      Marland Kitchen aspirin 81 MG tablet Take 81 mg by mouth daily.      . calcium & magnesium carbonates (MYLANTA) 311-232 MG per tablet Take 1 tablet by mouth 2 (two) times daily.      . carvedilol (COREG CR) 20 MG 24 hr capsule Take 20 mg by mouth daily.      . Cholecalciferol (VITAMIN D) 2000 UNITS CAPS Take by mouth.      . furosemide (LASIX) 20 MG tablet  Take 20 mg by mouth 2 (two) times daily.      . hydrALAZINE (APRESOLINE) 25 MG tablet Take 25 mg by mouth 3 (three) times daily.      Marland Kitchen lisinopril-hydrochlorothiazide (PRINZIDE,ZESTORETIC) 20-25 MG per tablet Take 1 tablet by mouth daily.      . clotrimazole-betamethasone (LOTRISONE) cream Apply to affected area 2 times daily prn  15 g  0  . doxycycline (DORYX) 150 MG EC tablet Take 150 mg by mouth 2 (two) times daily.      . ergocalciferol (VITAMIN D2) 50000 UNITS capsule Take 50,000 Units by mouth once a week.      Marland Kitchen HYDROcodone-acetaminophen (VICODIN) 5-500 MG per tablet Take 1 tablet by mouth every 6 (six) hours as needed.      . meloxicam (MOBIC) 7.5 MG tablet Take 1 tablet (7.5 mg total) by mouth daily.  10 tablet  0  . Multiple Vitamins-Minerals (MULTIVITAMIN WITH MINERALS) tablet Take 1 tablet by mouth daily.      Marland Kitchen terbinafine (LAMISIL) 250 MG tablet Take 1 tablet (250 mg total) by mouth daily.  7 tablet  0    Review of Systems Review of Systems  Constitutional: Negative for fever, chills and unexpected weight  change.  HENT: Positive for hearing loss. Negative for congestion, sore throat, trouble swallowing and voice change.   Eyes: Negative for visual disturbance.  Respiratory: Negative for cough and wheezing.   Cardiovascular: Positive for leg swelling. Negative for chest pain and palpitations.  Gastrointestinal: Negative for nausea, vomiting, abdominal pain, diarrhea, constipation, blood in stool, abdominal distention and anal bleeding.  Genitourinary: Negative for hematuria, vaginal bleeding and difficulty urinating.  Musculoskeletal: Positive for arthralgias.  Skin: Negative for rash and wound.  Neurological: Negative for seizures, syncope and headaches.  Hematological: Negative for adenopathy. Does not bruise/bleed easily.  Psychiatric/Behavioral: Negative for confusion.    Blood pressure 178/68, pulse 82, resp. rate 18, height 5\' 6"  (1.676 m), weight 179 lb 6.4 oz (81.375  kg).  Physical Exam Physical Exam Elderly female in NAD Neuro - awake, alert HEENT - hard of hearing; EOMI Neck - no cervical or supraclavicular lymphadenopathy Lungs - CTA B CV - RRR Abd - soft, nontender Groin - large palpable lymphadenopathy in both groins, larger on the right.  She has a healed varicose vein surgery incision in her groin, and just lateral to this incision, there is a 5 cm mass; non-tender; does not reduce; lymph nodes in left groin are about 2-3 cm across Both knees are quite swollen with pitting edema Data Reviewed Lower extremity dopplers  Assessment    Large bilateral inguinal lymphadenopathy - unclear etiology; possibly related to the inflammation in her knees    Plan    Excisional lymph node biopsy - right groin.  The surgical procedure has been discussed with the patient.  Potential risks, benefits, alternative treatments, and expected outcomes have been explained.  All of the patient's questions at this time have been answered.  The likelihood of reaching the patient's treatment goal is good.  The patient understand the proposed surgical procedure and wishes to proceed.        Jacolyn Joaquin K. 08/09/2011, 10:22 PM

## 2011-08-14 NOTE — Progress Notes (Signed)
To come in for labs Will ck on ekg with dr berry

## 2011-08-15 ENCOUNTER — Encounter (HOSPITAL_BASED_OUTPATIENT_CLINIC_OR_DEPARTMENT_OTHER)
Admission: RE | Admit: 2011-08-15 | Discharge: 2011-08-15 | Disposition: A | Discharge status: Home / Self Care | Payer: Medicare Other | Source: Ambulatory Visit | Attending: Surgery | Admitting: Surgery

## 2011-08-15 LAB — BASIC METABOLIC PANEL
CO2: 23 mEq/L (ref 19–32)
GFR calc non Af Amer: 35 mL/min — ABNORMAL LOW (ref >90)
Glucose, Bld: 102 mg/dL — ABNORMAL HIGH (ref 70–99)
Potassium: 4.3 mEq/L (ref 3.5–5.1)
Sodium: 141 mEq/L (ref 135–145)

## 2011-08-17 ENCOUNTER — Ambulatory Visit (HOSPITAL_BASED_OUTPATIENT_CLINIC_OR_DEPARTMENT_OTHER)
Admission: RE | Admit: 2011-08-17 | Discharge: 2011-08-17 | Disposition: A | Discharge status: Home / Self Care | Payer: Medicare Other | Source: Ambulatory Visit | Attending: Surgery | Admitting: Surgery

## 2011-08-17 ENCOUNTER — Encounter (HOSPITAL_BASED_OUTPATIENT_CLINIC_OR_DEPARTMENT_OTHER): Payer: Self-pay | Admitting: Anesthesiology

## 2011-08-17 ENCOUNTER — Encounter (HOSPITAL_BASED_OUTPATIENT_CLINIC_OR_DEPARTMENT_OTHER)
Admission: RE | Disposition: A | Discharge status: Home / Self Care | Payer: Self-pay | Source: Ambulatory Visit | Attending: Surgery

## 2011-08-17 ENCOUNTER — Ambulatory Visit (HOSPITAL_BASED_OUTPATIENT_CLINIC_OR_DEPARTMENT_OTHER): Payer: Medicare Other | Admitting: Anesthesiology

## 2011-08-17 ENCOUNTER — Encounter (HOSPITAL_BASED_OUTPATIENT_CLINIC_OR_DEPARTMENT_OTHER): Payer: Self-pay

## 2011-08-17 DIAGNOSIS — R590 Localized enlarged lymph nodes: Secondary | ICD-10-CM

## 2011-08-17 DIAGNOSIS — I129 Hypertensive chronic kidney disease with stage 1 through stage 4 chronic kidney disease, or unspecified chronic kidney disease: Secondary | ICD-10-CM | POA: Insufficient documentation

## 2011-08-17 DIAGNOSIS — N183 Chronic kidney disease, stage 3 unspecified: Secondary | ICD-10-CM | POA: Insufficient documentation

## 2011-08-17 DIAGNOSIS — R599 Enlarged lymph nodes, unspecified: Secondary | ICD-10-CM

## 2011-08-17 LAB — POCT HEMOGLOBIN-HEMACUE: Hemoglobin: 9.8 g/dL — ABNORMAL LOW (ref 12.0–15.0)

## 2011-08-17 SURGERY — BIOPSY, LYMPH NODE, INGUINAL, OPEN
Anesthesia: General | Site: Groin | Laterality: Right | Wound class: Clean

## 2011-08-17 MED ORDER — DEXAMETHASONE SODIUM PHOSPHATE 4 MG/ML IJ SOLN
INTRAMUSCULAR | Status: DC | PRN
  Administered 2011-08-17: 10 mg via INTRAVENOUS

## 2011-08-17 MED ORDER — PROPOFOL 10 MG/ML IV EMUL
INTRAVENOUS | Status: DC | PRN
  Administered 2011-08-17: 120 mg via INTRAVENOUS

## 2011-08-17 MED ORDER — CEFAZOLIN SODIUM 1-5 GM-% IV SOLN: 1.0000 g | INTRAVENOUS | Status: DC

## 2011-08-17 MED ORDER — LIDOCAINE HCL (CARDIAC) 20 MG/ML IV SOLN
INTRAVENOUS | Status: DC | PRN
  Administered 2011-08-17: 30 mg via INTRAVENOUS

## 2011-08-17 MED ORDER — CHLORHEXIDINE GLUCONATE 4 % EX LIQD: 1.0000 "application " | Freq: Once | CUTANEOUS | Status: DC

## 2011-08-17 MED ORDER — CEFAZOLIN SODIUM-DEXTROSE 2-3 GM-% IV SOLR
2.0000 g | INTRAVENOUS | Status: AC
  Administered 2011-08-17: 2 g via INTRAVENOUS

## 2011-08-17 MED ORDER — HYDROCODONE-ACETAMINOPHEN 5-325 MG PO TABS: 1.0000 | ORAL_TABLET | ORAL | Status: AC | PRN

## 2011-08-17 MED ORDER — GLYCOPYRROLATE 0.2 MG/ML IJ SOLN
INTRAMUSCULAR | Status: DC | PRN
  Administered 2011-08-17: 0.2 mg via INTRAVENOUS

## 2011-08-17 MED ORDER — FENTANYL CITRATE 0.05 MG/ML IJ SOLN: 25.0000 ug | INTRAMUSCULAR | Status: DC | PRN

## 2011-08-17 MED ORDER — BUPIVACAINE-EPINEPHRINE 0.25% -1:200000 IJ SOLN
INTRAMUSCULAR | Status: DC | PRN
  Administered 2011-08-17: 10 mL

## 2011-08-17 MED ORDER — 0.9 % SODIUM CHLORIDE (POUR BTL) OPTIME
TOPICAL | Status: DC | PRN
  Administered 2011-08-17: 1000 mL

## 2011-08-17 MED ORDER — PROMETHAZINE HCL 25 MG/ML IJ SOLN: 6.2500 mg | INTRAMUSCULAR | Status: DC | PRN

## 2011-08-17 MED ORDER — LACTATED RINGERS IV SOLN
INTRAVENOUS | Status: DC
  Administered 2011-08-17 (×3): via INTRAVENOUS

## 2011-08-17 SURGICAL SUPPLY — 50 items
BENZOIN TINCTURE PRP APPL 2/3 (GAUZE/BANDAGES/DRESSINGS) ×2 IMPLANT
BLADE HEX COATED 2.75 (ELECTRODE) ×2 IMPLANT
BLADE SURG 15 STRL LF DISP TIS (BLADE) ×1 IMPLANT
BLADE SURG 15 STRL SS (BLADE) ×2
BLADE SURG ROTATE 9660 (MISCELLANEOUS) IMPLANT
CANISTER SUCTION 1200CC (MISCELLANEOUS) ×2 IMPLANT
CHLORAPREP W/TINT 26ML (MISCELLANEOUS) ×2 IMPLANT
CLOTH BEACON ORANGE TIMEOUT ST (SAFETY) ×2 IMPLANT
COVER MAYO STAND STRL (DRAPES) ×2 IMPLANT
COVER TABLE BACK 60X90 (DRAPES) ×2 IMPLANT
DECANTER SPIKE VIAL GLASS SM (MISCELLANEOUS) IMPLANT
DRAPE LAPAROTOMY TRNSV 102X78 (DRAPE) ×2 IMPLANT
DRAPE UTILITY XL STRL (DRAPES) ×2 IMPLANT
DRSG TEGADERM 2-3/8X2-3/4 SM (GAUZE/BANDAGES/DRESSINGS) ×2 IMPLANT
DRSG TEGADERM 4X4.75 (GAUZE/BANDAGES/DRESSINGS) ×2 IMPLANT
ELECT REM PT RETURN 9FT ADLT (ELECTROSURGICAL) ×2
ELECTRODE REM PT RTRN 9FT ADLT (ELECTROSURGICAL) ×1 IMPLANT
GAUZE SPONGE 4X4 12PLY STRL LF (GAUZE/BANDAGES/DRESSINGS) ×2 IMPLANT
GLOVE BIO SURGEON STRL SZ7 (GLOVE) ×2 IMPLANT
GLOVE BIOGEL PI IND STRL 7.5 (GLOVE) ×1 IMPLANT
GLOVE BIOGEL PI INDICATOR 7.5 (GLOVE) ×1
GOWN PREVENTION PLUS XLARGE (GOWN DISPOSABLE) ×4 IMPLANT
NEEDLE HYPO 25X1 1.5 SAFETY (NEEDLE) ×2 IMPLANT
NS IRRIG 1000ML POUR BTL (IV SOLUTION) ×2 IMPLANT
PACK BASIN DAY SURGERY FS (CUSTOM PROCEDURE TRAY) ×2 IMPLANT
PAIN PUMP ON-Q 100MLX2ML 2.5IN (PAIN MANAGEMENT) IMPLANT
PENCIL BUTTON HOLSTER BLD 10FT (ELECTRODE) ×2 IMPLANT
SLEEVE SCD COMPRESS KNEE MED (MISCELLANEOUS) ×2 IMPLANT
SPONGE INTESTINAL PEANUT (DISPOSABLE) IMPLANT
SPONGE LAP 18X18 X RAY DECT (DISPOSABLE) IMPLANT
SPONGE LAP 4X18 X RAY DECT (DISPOSABLE) ×2 IMPLANT
STRIP CLOSURE SKIN 1/2X4 (GAUZE/BANDAGES/DRESSINGS) ×2 IMPLANT
SUT MON AB 4-0 PC3 18 (SUTURE) ×2 IMPLANT
SUT SILK 2 0 SH (SUTURE) IMPLANT
SUT SILK 3 0 SH 30 (SUTURE) IMPLANT
SUT SILK 3 0 SH CR/8 (SUTURE) ×4 IMPLANT
SUT SILK 3 0 TIES 17X18 (SUTURE) ×1
SUT SILK 3-0 18XBRD TIE BLK (SUTURE) ×1 IMPLANT
SUT VIC AB 0 SH 27 (SUTURE) IMPLANT
SUT VIC AB 2-0 SH 27 (SUTURE) ×2
SUT VIC AB 2-0 SH 27XBRD (SUTURE) ×2 IMPLANT
SUT VIC AB 3-0 SH 27 (SUTURE) ×1
SUT VIC AB 3-0 SH 27X BRD (SUTURE) ×1 IMPLANT
SUT VICRYL 3-0 CR8 SH (SUTURE) ×2 IMPLANT
SYR CONTROL 10ML LL (SYRINGE) ×2 IMPLANT
TOWEL OR 17X24 6PK STRL BLUE (TOWEL DISPOSABLE) ×2 IMPLANT
TOWEL OR NON WOVEN STRL DISP B (DISPOSABLE) ×2 IMPLANT
TUBE CONNECTING 20X1/4 (TUBING) ×2 IMPLANT
WATER STERILE IRR 1000ML POUR (IV SOLUTION) IMPLANT
YANKAUER SUCT BULB TIP NO VENT (SUCTIONS) ×2 IMPLANT

## 2011-08-17 NOTE — H&P (View-Only) (Signed)
Patient ID: Meghan Russo, female   DOB: 11/16/1920, 78 y.o.   MRN: [id number]  Chief Complaint  Patient presents with  . Other    Eval Bilateral groin adenopathy    HPI Meghan Russo is a 78 y.o. female.  Referred by Dr. Robyn Sanders for evaluation of groin lymphadenopathy HPI This is an 78 year old female with some medical comorbidities who has recently had a lot of swelling and pain in both knees.  This has been present for about one month.  She underwent venous dopplers showed no evidence of DVT, but did not significant bilateral inguinal lymphadenopathy with the largest measuring 5.7 x 2 cm on the right side.  She presents now for surgical consultation.  She denies any unexplained weight loss, night sweats.   Past Medical History  Diagnosis Date  . Hypertension   . Knee pain   . Edema   . Chronic kidney disease, stage III (moderate)   . Benign hypertensive kidney disease with chronic kidney disease stage I through stage IV, or unspecified     Past Surgical History  Procedure Date  . Vericose vein surgery     History reviewed. No pertinent family history.  Social History History  Substance Use Topics  . Smoking status: Never Smoker   . Smokeless tobacco: Not on file  . Alcohol Use: No    Allergies  Allergen Reactions  . Asa (Aspirin) Rash    Current Outpatient Prescriptions  Medication Sig Dispense Refill  . alendronate (FOSAMAX) 70 MG tablet Take 70 mg by mouth every 7 (seven) days. Take with a full glass of water on an empty stomach.      . amLODipine-olmesartan (AZOR) 10-40 MG per tablet Take 1 tablet by mouth daily.      . aspirin 81 MG tablet Take 81 mg by mouth daily.      . calcium & magnesium carbonates (MYLANTA) 311-232 MG per tablet Take 1 tablet by mouth 2 (two) times daily.      . carvedilol (COREG CR) 20 MG 24 hr capsule Take 20 mg by mouth daily.      . Cholecalciferol (VITAMIN D) 2000 UNITS CAPS Take by mouth.      . furosemide (LASIX) 20 MG tablet  Take 20 mg by mouth 2 (two) times daily.      . hydrALAZINE (APRESOLINE) 25 MG tablet Take 25 mg by mouth 3 (three) times daily.      . lisinopril-hydrochlorothiazide (PRINZIDE,ZESTORETIC) 20-25 MG per tablet Take 1 tablet by mouth daily.      . clotrimazole-betamethasone (LOTRISONE) cream Apply to affected area 2 times daily prn  15 g  0  . doxycycline (DORYX) 150 MG EC tablet Take 150 mg by mouth 2 (two) times daily.      . ergocalciferol (VITAMIN D2) 50000 UNITS capsule Take 50,000 Units by mouth once a week.      . HYDROcodone-acetaminophen (VICODIN) 5-500 MG per tablet Take 1 tablet by mouth every 6 (six) hours as needed.      . meloxicam (MOBIC) 7.5 MG tablet Take 1 tablet (7.5 mg total) by mouth daily.  10 tablet  0  . Multiple Vitamins-Minerals (MULTIVITAMIN WITH MINERALS) tablet Take 1 tablet by mouth daily.      . terbinafine (LAMISIL) 250 MG tablet Take 1 tablet (250 mg total) by mouth daily.  7 tablet  0    Review of Systems Review of Systems  Constitutional: Negative for fever, chills and unexpected weight   change.  HENT: Positive for hearing loss. Negative for congestion, sore throat, trouble swallowing and voice change.   Eyes: Negative for visual disturbance.  Respiratory: Negative for cough and wheezing.   Cardiovascular: Positive for leg swelling. Negative for chest pain and palpitations.  Gastrointestinal: Negative for nausea, vomiting, abdominal pain, diarrhea, constipation, blood in stool, abdominal distention and anal bleeding.  Genitourinary: Negative for hematuria, vaginal bleeding and difficulty urinating.  Musculoskeletal: Positive for arthralgias.  Skin: Negative for rash and wound.  Neurological: Negative for seizures, syncope and headaches.  Hematological: Negative for adenopathy. Does not bruise/bleed easily.  Psychiatric/Behavioral: Negative for confusion.    Blood pressure 178/68, pulse 82, resp. rate 18, height 5' 6" (1.676 m), weight 179 lb 6.4 oz (81.375  kg).  Physical Exam Physical Exam Elderly female in NAD Neuro - awake, alert HEENT - hard of hearing; EOMI Neck - no cervical or supraclavicular lymphadenopathy Lungs - CTA B CV - RRR Abd - soft, nontender Groin - large palpable lymphadenopathy in both groins, larger on the right.  She has a healed varicose vein surgery incision in her groin, and just lateral to this incision, there is a 5 cm mass; non-tender; does not reduce; lymph nodes in left groin are about 2-3 cm across Both knees are quite swollen with pitting edema Data Reviewed Lower extremity dopplers  Assessment    Large bilateral inguinal lymphadenopathy - unclear etiology; possibly related to the inflammation in her knees    Plan    Excisional lymph node biopsy - right groin.  The surgical procedure has been discussed with the patient.  Potential risks, benefits, alternative treatments, and expected outcomes have been explained.  All of the patient's questions at this time have been answered.  The likelihood of reaching the patient's treatment goal is good.  The patient understand the proposed surgical procedure and wishes to proceed.        Rolly Magri K. 08/09/2011, 10:22 PM    

## 2011-08-17 NOTE — Op Note (Signed)
Preop diagnosis: Bilateral inguinal lymphadenopathy  postop diagnosis: Same Procedure performed: Right inguinal lymph node biopsy Surgeon:Meliza Kage K. Anesthesia: Gen. Indications: This is an 78 year old female who presents with significant bilateral inguinal lymphadenopathy over the last weeks. We are asked to perform a lymph node biopsy for diagnosis. An ultrasound confirms no sign of DVT at did confirm the inguinal lymphadenopathy. The largest mass is palpated on the right side. Therefore we made the decision to perform a lymph node biopsy on the side.  Description of procedure: The patient was brought to the operating room and placed in a supine position on the operating room table. After an adequate level of general anesthesia was obtained her right groin was shaved prepped with chlor prep and Betadine and draped sterile fashion. A timeout was taken to ensure the proper patient proper procedure. We palpated the area directly over the largest is palpable mass. I infiltrated this area with quarter percent Marcaine with epinephrine. We made a decision over this area. Dissection was carried down into the subcutaneous tissues with cautery. We dissected down to the mass. We used a self-retaining retractor for exposure. We then tried to mobilize the lymph node. The tissue around the lymph node seemed fairly fibrotic. We encountered some bleeding from some veins that were densely adherent to the side of the lymph node. We were able to control bleeding with 3-0 silk sutures. The anatomy is very difficult to visualize due to the fibrosis in this area. The lymph node that we were working on measuring about 2-1/2 cm across. We then performed a partial lymph node biopsy by excising about a third of the lymph node with a 15 blade. This was sent for pathologic examination. The raw surface of the lymph node was then cauterized and oversewn with silk. We irrigated thoroughly and inspected for hemostasis. The wound  was closed with a deep layer of 3-0 Vicryl and a subcuticular layer of 4-0 Monocryl. Steri-Strips and clean dressings were applied. The patient was then extubated and brought to recovery in stable condition. All sponge instrument and needle counts are correct.  Meghan Russo. Meghan Skains, MD, [facility]  08/17/2011 1:36 PM

## 2011-08-17 NOTE — Interval H&P Note (Signed)
History and Physical Interval Note:  08/17/2011 10:57 AM  Meghan Russo  has presented today for surgery, with the diagnosis of Right inguinal lymphadenopathy  The various methods of treatment have been discussed with the patient and family. After consideration of risks, benefits and other options for treatment, the patient has consented to  Procedure(s) (LRB): INGUINAL LYMPH NODE BIOPSY (Right) as a surgical intervention .  The patients' history has been reviewed, patient examined, no change in status, stable for surgery.  I have reviewed the patients' chart and labs.  Questions were answered to the patient's satisfaction.     [name]

## 2011-08-17 NOTE — Discharge Instructions (Signed)
You may use an ice pack over the incision in your groin On Sunday, you may remove the dressing and the piece of gauze.  There will be some steri-strips underneath the gauze.  You may begin showering over these steri-strips.  They will come off in a week or two.    Post Anesthesia Home Care Instructions  Activity: Get plenty of rest for the remainder of the day. A responsible adult should stay with you for 24 hours following the procedure.  For the next 24 hours, DO NOT: -Drive a car -Advertising copywriter -Drink alcoholic beverages -Take any medication unless instructed by your physician -Make any legal decisions or sign important papers.  Meals: Start with liquid foods such as gelatin or soup. Progress to regular foods as tolerated. Avoid greasy, spicy, heavy foods. If nausea and/or vomiting occur, drink only clear liquids until the nausea and/or vomiting subsides. Call your physician if vomiting continues.  Special Instructions/Symptoms: Your throat may feel dry or sore from the anesthesia or the breathing tube placed in your throat during surgery. If this causes discomfort, gargle with warm salt water. The discomfort should disappear within 24 hours.

## 2011-08-17 NOTE — Anesthesia Preprocedure Evaluation (Signed)
Anesthesia Evaluation  Patient identified by MRN, date of birth, ID band Patient awake    Reviewed: Allergy & Precautions, H&P , NPO status , Patient's Chart, lab work & pertinent test results, reviewed documented beta blocker date and time   History of Anesthesia Complications Negative for: history of anesthetic complications  Airway Mallampati: I TM Distance: >3 FB Neck ROM: Full    Dental  (+) Edentulous Upper and Edentulous Lower   Pulmonary neg pulmonary ROS,  breath sounds clear to auscultation  Pulmonary exam normal       Cardiovascular hypertension, Pt. on medications and Pt. on home beta blockers + Valvular Problems/Murmurs (ECHO '05 mild AS with mod AI, LVH with EF 65-75%) AS and AI Rhythm:Regular Rate:Normal + Systolic murmurs (III/VI SEM) and + Diastolic murmurs (I/VI )    Neuro/Psych negative neurological ROS  negative psych ROS   GI/Hepatic negative GI ROS, Neg liver ROS,   Endo/Other  negative endocrine ROS  Renal/GU Renal InsufficiencyRenal disease (creat 1.32)     Musculoskeletal   Abdominal   Peds  Hematology   Anesthesia Other Findings   Reproductive/Obstetrics                           Anesthesia Physical Anesthesia Plan  ASA: III  Anesthesia Plan: General   Post-op Pain Management:    Induction: Intravenous  Airway Management Planned: LMA  Additional Equipment:   Intra-op Plan:   Post-operative Plan:   Informed Consent: I have reviewed the patients History and Physical, chart, labs and discussed the procedure including the risks, benefits and alternatives for the proposed anesthesia with the patient or authorized representative who has indicated his/her understanding and acceptance.     Plan Discussed with: CRNA and Surgeon  Anesthesia Plan Comments: (Plan routine monitors, GA- LMA OK)        Anesthesia Quick Evaluation

## 2011-08-17 NOTE — Anesthesia Postprocedure Evaluation (Signed)
  Anesthesia Post-op Note  Patient: Meghan Russo  Procedure(s) Performed: Procedure(s) (LRB): INGUINAL LYMPH NODE BIOPSY (Right)  Patient Location: PACU  Anesthesia Type: General  Level of Consciousness: awake, alert  and oriented  Airway and Oxygen Therapy: Patient Spontanous Breathing  Post-op Pain: none  Post-op Assessment: Post-op Vital signs reviewed, Patient's Cardiovascular Status Stable, Respiratory Function Stable, Patent Airway, No signs of Nausea or vomiting and Pain level controlled  Post-op Vital Signs: Reviewed and stable  Complications: No apparent anesthesia complications

## 2011-08-17 NOTE — Transfer of Care (Signed)
Immediate Anesthesia Transfer of Care Note  Patient: Meghan Russo  Procedure(s) Performed: Procedure(s) (LRB): INGUINAL LYMPH NODE BIOPSY (Right)  Patient Location: PACU  Anesthesia Type: General  Level of Consciousness: sedated  Airway & Oxygen Therapy: Patient Spontanous Breathing and Patient connected to face mask oxygen  Post-op Assessment: Report given to PACU RN and Post -op Vital signs reviewed and stable  Post vital signs: Reviewed and stable  Complications: No apparent anesthesia complications

## 2011-08-22 ENCOUNTER — Telehealth (INDEPENDENT_AMBULATORY_CARE_PROVIDER_SITE_OTHER): Payer: Self-pay

## 2011-08-22 NOTE — Telephone Encounter (Signed)
I called the daughter back and notified her that Dr Corliss Skains wants to see the pt next week for a check up but there is nothing further to be done on the lymphnode

## 2011-08-22 NOTE — Telephone Encounter (Signed)
I called and spoke to the patient's daughter and notified her that the pathology showed no sign of cancer.  She wanted to know what the lump was.  I told her the report said it was benign lymph tissue, not a cancer.  She wants to know if there is anything to be done and if the pt should come in next week for follow up.

## 2011-08-29 ENCOUNTER — Encounter (INDEPENDENT_AMBULATORY_CARE_PROVIDER_SITE_OTHER): Payer: Self-pay | Admitting: Surgery

## 2011-08-29 ENCOUNTER — Ambulatory Visit (INDEPENDENT_AMBULATORY_CARE_PROVIDER_SITE_OTHER): Payer: Medicare Other | Admitting: Surgery

## 2011-08-29 VITALS — BP 126/50 | HR 60 | Temp 98.3°F | Resp 14 | Ht 64.0 in | Wt 188.6 lb

## 2011-08-29 DIAGNOSIS — R590 Localized enlarged lymph nodes: Secondary | ICD-10-CM

## 2011-08-29 NOTE — Progress Notes (Signed)
Patient ID: Meghan Russo, female   DOB: 01-Dec-1920, 78 y.o.   MRN: [id number]   Status post right inguinal lymph node biopsy on 08/17/11. The biopsies showed no sign of malignancy. There are benign dermatopathic changes.   This is likely due to her swollen knees. The incision is healed. She has a large area of scar tissue under the incision. No sign of infection. There is no pain. No further treatment needed. She will follow up on a p.r.n. Basis.  Wilmon Arms. Corliss Skains, MD, [facility]  08/29/2011 9:21 AM

## 2011-09-24 ENCOUNTER — Encounter (INDEPENDENT_AMBULATORY_CARE_PROVIDER_SITE_OTHER): Payer: Self-pay

## 2011-10-05 ENCOUNTER — Encounter (INDEPENDENT_AMBULATORY_CARE_PROVIDER_SITE_OTHER): Payer: Self-pay | Admitting: General Surgery

## 2011-10-05 NOTE — Progress Notes (Signed)
Pt's daughter at the office for a personal visit and had a question about her mother's surgery with Dr. Corliss Skains.  After making sure she was cleared on mother's HIPPA, discussed the evolution of a scar with her.  She understands and will help her mother watch the area for now.

## 2012-03-06 ENCOUNTER — Emergency Department (HOSPITAL_COMMUNITY)
Admission: EM | Admit: 2012-03-06 | Discharge: 2012-03-06 | Disposition: A | Discharge status: Home / Self Care | Payer: Medicare Other | Attending: Emergency Medicine | Admitting: Emergency Medicine

## 2012-03-06 ENCOUNTER — Encounter (HOSPITAL_COMMUNITY): Payer: Self-pay | Admitting: Emergency Medicine

## 2012-03-06 ENCOUNTER — Emergency Department (HOSPITAL_COMMUNITY)
Admission: EM | Admit: 2012-03-06 | Discharge: 2012-03-06 | Disposition: A | Discharge status: Nursing Home (Includes ICF) | Payer: Medicare Other | Source: Home / Self Care | Attending: Emergency Medicine | Admitting: Emergency Medicine

## 2012-03-06 ENCOUNTER — Emergency Department (HOSPITAL_COMMUNITY): Payer: Medicare Other

## 2012-03-06 DIAGNOSIS — R109 Unspecified abdominal pain: Secondary | ICD-10-CM | POA: Insufficient documentation

## 2012-03-06 DIAGNOSIS — M546 Pain in thoracic spine: Secondary | ICD-10-CM | POA: Insufficient documentation

## 2012-03-06 DIAGNOSIS — M549 Dorsalgia, unspecified: Secondary | ICD-10-CM

## 2012-03-06 DIAGNOSIS — N183 Chronic kidney disease, stage 3 unspecified: Secondary | ICD-10-CM | POA: Insufficient documentation

## 2012-03-06 DIAGNOSIS — S239XXA Sprain of unspecified parts of thorax, initial encounter: Secondary | ICD-10-CM

## 2012-03-06 DIAGNOSIS — Z7982 Long term (current) use of aspirin: Secondary | ICD-10-CM | POA: Insufficient documentation

## 2012-03-06 DIAGNOSIS — Z79899 Other long term (current) drug therapy: Secondary | ICD-10-CM | POA: Insufficient documentation

## 2012-03-06 DIAGNOSIS — I129 Hypertensive chronic kidney disease with stage 1 through stage 4 chronic kidney disease, or unspecified chronic kidney disease: Secondary | ICD-10-CM | POA: Insufficient documentation

## 2012-03-06 LAB — CBC WITH DIFFERENTIAL/PLATELET
Basophils Absolute: 0.1 10*3/uL (ref 0.0–0.1)
Basophils Relative: 1 % (ref 0–1)
Eosinophils Absolute: 0.6 10*3/uL (ref 0.0–0.7)
Eosinophils Relative: 9 % — ABNORMAL HIGH (ref 0–5)
HCT: 33.1 % — ABNORMAL LOW (ref 36.0–46.0)
Hemoglobin: 10.9 g/dL — ABNORMAL LOW (ref 12.0–15.0)
Lymphocytes Relative: 20 % (ref 12–46)
Lymphs Abs: 1.5 10*3/uL (ref 0.7–4.0)
MCH: 32.2 pg (ref 26.0–34.0)
MCHC: 32.9 g/dL (ref 30.0–36.0)
MCV: 97.9 fL (ref 78.0–100.0)
Monocytes Absolute: 0.9 10*3/uL (ref 0.1–1.0)
Monocytes Relative: 12 % (ref 3–12)
Neutro Abs: 4.2 10*3/uL (ref 1.7–7.7)
Neutrophils Relative %: 58 % (ref 43–77)
Platelets: 178 10*3/uL (ref 150–400)
RBC: 3.38 MIL/uL — ABNORMAL LOW (ref 3.87–5.11)
RDW: 14.2 % (ref 11.5–15.5)
WBC: 7.2 10*3/uL (ref 4.0–10.5)

## 2012-03-06 LAB — POCT URINALYSIS DIP (DEVICE)
Bilirubin Urine: NEGATIVE
Ketones, ur: NEGATIVE mg/dL

## 2012-03-06 LAB — COMPREHENSIVE METABOLIC PANEL
ALT: 7 U/L (ref 0–35)
AST: 15 U/L (ref 0–37)
CO2: 26 mEq/L (ref 19–32)
Chloride: 101 mEq/L (ref 96–112)
Creatinine, Ser: 1.54 mg/dL — ABNORMAL HIGH (ref 0.50–1.10)
GFR calc Af Amer: 33 mL/min — ABNORMAL LOW (ref >90)
GFR calc non Af Amer: 29 mL/min — ABNORMAL LOW (ref >90)
Glucose, Bld: 111 mg/dL — ABNORMAL HIGH (ref 70–99)
Total Bilirubin: 1 mg/dL (ref 0.3–1.2)

## 2012-03-06 LAB — COMPREHENSIVE METABOLIC PANEL WITH GFR
Albumin: 3.8 g/dL (ref 3.5–5.2)
Alkaline Phosphatase: 71 U/L (ref 39–117)
BUN: 45 mg/dL — ABNORMAL HIGH (ref 6–23)
Calcium: 9.9 mg/dL (ref 8.4–10.5)
Potassium: 4.2 meq/L (ref 3.5–5.1)
Sodium: 140 meq/L (ref 135–145)
Total Protein: 8.4 g/dL — ABNORMAL HIGH (ref 6.0–8.3)

## 2012-03-06 LAB — CG4 I-STAT (LACTIC ACID): Lactic Acid, Venous: 1.15 mmol/L (ref 0.5–2.2)

## 2012-03-06 LAB — LIPASE, BLOOD: Lipase: 27 U/L (ref 11–59)

## 2012-03-06 MED ORDER — TRAMADOL HCL 50 MG PO TABS: 50.0000 mg | ORAL_TABLET | Freq: Three times a day (TID) | ORAL | Status: DC | PRN

## 2012-03-06 NOTE — ED Notes (Signed)
Pt c/o all over back pain that started Saturday. Pt states that pain is dull ache and is constant.  Denies injury or urinary symptoms.  Pt is drinking water to get urine specimen.

## 2012-03-06 NOTE — ED Provider Notes (Signed)
History     CSN: [id number]  Arrival date & time 03/06/12  4098   First MD Initiated Contact with Patient 03/06/12 1102      Chief Complaint  Patient presents with  . Back Pain    all over back pain started saturday/constant    (Consider location/radiation/quality/duration/timing/severity/associated sxs/prior treatment) HPI Comments: Patient presents urgent care describing that since Saturday she's been having upper back pain mainly on her left scapular region it radiates all the way around her chest towards her left upper abdomen. Patient denies any recent increased physical activity or any falls or injuries. Patient denies any shortness of breath diaphoresis feeling nauseous or vomiting or diarrheas, describes the pain as a dull and constant discomfort that constantly shoots from her left upper back to her left upper rib cage and abdomen.  Patient is a 78 y.o. female presenting with back pain. The history is provided by the patient.  Back Pain  This is a new problem. The current episode started more than 2 days ago. The problem occurs constantly. The problem has been gradually worsening. The pain is associated with no known injury. Associated symptoms include abdominal pain. Pertinent negatives include no fever, no numbness, no weight loss, no headaches, no bowel incontinence, no perianal numbness, no dysuria, no pelvic pain, no leg pain, no paresthesias and no paresis. The treatment provided no relief.    Past Medical History  Diagnosis Date  . Hypertension   . Knee pain   . Edema   . Chronic kidney disease, stage III (moderate)   . Benign hypertensive kidney disease with chronic kidney disease stage I through stage IV, or unspecified(403.10)     Past Surgical History  Procedure Date  . Vericose vein surgery     History reviewed. No pertinent family history.  History  Substance Use Topics  . Smoking status: Never Smoker   . Smokeless tobacco: Not on file  . Alcohol Use:  No    OB History    Grav Para Term Preterm Abortions TAB SAB Ect Mult Living                  Review of Systems  Constitutional: Positive for activity change. Negative for fever, chills, weight loss and fatigue.  HENT: Negative for neck pain.   Respiratory: Negative for cough and shortness of breath.   Gastrointestinal: Positive for abdominal pain. Negative for nausea and bowel incontinence.  Genitourinary: Negative for dysuria and pelvic pain.  Musculoskeletal: Positive for back pain.  Skin: Negative for color change, rash and wound.  Neurological: Negative for dizziness, numbness, headaches and paresthesias.    Allergies  Asa  Home Medications   Current Outpatient Rx  Name  Route  Sig  Dispense  Refill  . ALENDRONATE SODIUM 70 MG PO TABS   Oral   Take 70 mg by mouth every 7 (seven) days. Take with a full glass of water on an empty stomach.         . AMLODIPINE-OLMESARTAN 10-40 MG PO TABS   Oral   Take 1 tablet by mouth daily.         Marland Kitchen CARVEDILOL PHOSPHATE ER 20 MG PO CP24   Oral   Take 20 mg by mouth daily.         Marland Kitchen CLOTRIMAZOLE-BETAMETHASONE 1-0.05 % EX CREA      Apply to affected area 2 times daily prn   15 g   0   . ERGOCALCIFEROL 50000 UNITS PO CAPS  Oral   Take 50,000 Units by mouth once a week.         . FUROSEMIDE 20 MG PO TABS   Oral   Take 20 mg by mouth 2 (two) times daily.         Marland Kitchen HYDRALAZINE HCL 25 MG PO TABS   Oral   Take 25 mg by mouth 3 (three) times daily.         Marland Kitchen LISINOPRIL-HYDROCHLOROTHIAZIDE 20-25 MG PO TABS   Oral   Take 1 tablet by mouth daily.         . MELOXICAM 7.5 MG PO TABS   Oral   Take 1 tablet (7.5 mg total) by mouth daily.   10 tablet   0   . ASPIRIN 81 MG PO TABS   Oral   Take 81 mg by mouth daily.         Marland Kitchen CALCIUM & MAGNESIUM CARBONATES 311-232 MG PO TABS   Oral   Take 1 tablet by mouth 2 (two) times daily.         Marland Kitchen VITAMIN D 2000 UNITS PO CAPS   Oral   Take by mouth.           . DOXYCYCLINE HYCLATE 150 MG PO TBEC   Oral   Take 150 mg by mouth 2 (two) times daily.         . MULTI-VITAMIN/MINERALS PO TABS   Oral   Take 1 tablet by mouth daily.         . TERBINAFINE HCL 250 MG PO TABS   Oral   Take 1 tablet (250 mg total) by mouth daily.   7 tablet   0     BP 176/63  Pulse 68  Temp 98.4 F (36.9 C) (Oral)  Resp 20  SpO2 98%  Physical Exam  Nursing note and vitals reviewed. Constitutional: She is active. She appears distressed.    Neck: No JVD present.  Pulmonary/Chest: Effort normal and breath sounds normal.  Abdominal: Soft. Bowel sounds are normal. She exhibits no distension and no mass. There is no tenderness. There is no rebound and no guarding.  Neurological: She is alert.  Skin: Skin is warm. No rash noted. No erythema.    ED Course  Procedures (including critical care time)  Labs Reviewed  POCT URINALYSIS DIP (DEVICE) - Abnormal; Notable for the following:    Leukocytes, UA TRACE (*)  Biochemical Testing Only. Please order routine urinalysis from main lab if confirmatory testing is needed.   All other components within normal limits   No results found.   No diagnosis found.    MDM  We are performing a 12-lead EKG as patient is presenting with a atypical pattern of pain am unable to elicit any musculoskeletal or abdominal exam findings to suggest any particular etiology. At this point we have no x-rays at urgent care which I would consider appropriate to further loosen date the source of this pain. Differential diagnosis is wide. Patient is hemodynamically stable, but looks uncomfortable.        Jimmie Molly, MD 03/06/12 [phone]

## 2012-03-06 NOTE — ED Notes (Signed)
Lactic acid istat results given to Dr. Oletta Lamas.

## 2012-03-06 NOTE — ED Provider Notes (Signed)
History     CSN: [id number]  Arrival date & time 03/06/12  1221   First MD Initiated Contact with Patient 03/06/12 1802      Chief Complaint  Patient presents with  . Back Pain  . Abdominal Pain    (Consider location/radiation/quality/duration/timing/severity/associated sxs/prior treatment) Patient is a 78 y.o. female presenting with back pain and abdominal pain. The history is provided by the patient (the pt complains of minor upper back pain). No language interpreter was used.  Back Pain  This is a recurrent problem. The current episode started yesterday. The problem occurs rarely. The problem has not changed since onset.The pain is associated with no known injury. The pain is present in the thoracic spine. The quality of the pain is described as aching. The pain does not radiate. The pain is at a severity of 3/10. Associated symptoms include abdominal pain. Pertinent negatives include no chest pain and no headaches.  Abdominal Pain The primary symptoms of the illness include abdominal pain. The primary symptoms of the illness do not include fatigue or diarrhea.  Additional symptoms associated with the illness include back pain. Symptoms associated with the illness do not include hematuria or frequency.    Past Medical History  Diagnosis Date  . Hypertension   . Knee pain   . Edema   . Chronic kidney disease, stage III (moderate)   . Benign hypertensive kidney disease with chronic kidney disease stage I through stage IV, or unspecified(403.10)     Past Surgical History  Procedure Date  . Vericose vein surgery     History reviewed. No pertinent family history.  History  Substance Use Topics  . Smoking status: Never Smoker   . Smokeless tobacco: Not on file  . Alcohol Use: No    OB History    Grav Para Term Preterm Abortions TAB SAB Ect Mult Living                  Review of Systems  Constitutional: Negative for fatigue.  HENT: Negative for congestion, sinus  pressure and ear discharge.   Eyes: Negative for discharge.  Respiratory: Negative for cough.   Cardiovascular: Negative for chest pain.  Gastrointestinal: Positive for abdominal pain. Negative for diarrhea.  Genitourinary: Negative for frequency and hematuria.  Musculoskeletal: Positive for back pain.  Skin: Negative for rash.  Neurological: Negative for seizures and headaches.  Hematological: Negative.   Psychiatric/Behavioral: Negative for hallucinations.    Allergies  Asa  Home Medications   Current Outpatient Rx  Name  Route  Sig  Dispense  Refill  . ASPIRIN 81 MG PO TABS   Oral   Take 81 mg by mouth daily.         Marland Kitchen CARVEDILOL 6.25 MG PO TABS   Oral   Take 6.25 mg by mouth 2 (two) times daily with a meal.         . VITAMIN D 2000 UNITS PO CAPS   Oral   Take 1 capsule by mouth daily.          . FUROSEMIDE 40 MG PO TABS   Oral   Take 40 mg by mouth 2 (two) times daily.         Marland Kitchen HYDRALAZINE HCL 50 MG PO TABS   Oral   Take 50 mg by mouth 3 (three) times daily.         Marland Kitchen LISINOPRIL-HYDROCHLOROTHIAZIDE 20-25 MG PO TABS   Oral   Take 1 tablet by mouth daily.         Marland Kitchen  MULTI-VITAMIN/MINERALS PO TABS   Oral   Take 1 tablet by mouth daily.         . TRAMADOL HCL 50 MG PO TABS   Oral   Take 1 tablet (50 mg total) by mouth every 8 (eight) hours as needed for pain.   20 tablet   0     BP 178/41  Pulse 66  Temp 97.9 F (36.6 C) (Oral)  Resp 16  SpO2 98%  Physical Exam  Constitutional: She is oriented to person, place, and time. She appears well-developed.  HENT:  Head: Normocephalic and atraumatic.  Eyes: Conjunctivae normal and EOM are normal. No scleral icterus.  Neck: Neck supple. No thyromegaly present.  Cardiovascular: Normal rate and regular rhythm.  Exam reveals no gallop and no friction rub.   No murmur heard. Pulmonary/Chest: No stridor. She has no wheezes. She has no rales. She exhibits no tenderness.  Abdominal: She exhibits no  distension. There is no tenderness. There is no rebound.  Musculoskeletal: Normal range of motion. She exhibits no edema.       Tender left upper back  Lymphadenopathy:    She has no cervical adenopathy.  Neurological: She is oriented to person, place, and time. Coordination normal.  Skin: No rash noted. No erythema.  Psychiatric: She has a normal mood and affect. Her behavior is normal.    ED Course  Procedures (including critical care time)  Labs Reviewed  CBC WITH DIFFERENTIAL - Abnormal; Notable for the following:    RBC 3.38 (*)     Hemoglobin 10.9 (*)     HCT 33.1 (*)     Eosinophils Relative 9 (*)     All other components within normal limits  COMPREHENSIVE METABOLIC PANEL - Abnormal; Notable for the following:    Glucose, Bld 111 (*)     BUN 45 (*)     Creatinine, Ser 1.54 (*)     Total Protein 8.4 (*)     GFR calc non Af Amer 29 (*)     GFR calc Af Amer 33 (*)     All other components within normal limits  LIPASE, BLOOD  CG4 I-STAT (LACTIC ACID)   Dg Abd Acute W/chest  03/06/2012  *RADIOLOGY REPORT*  Clinical Data: Abdominal pain.  ACUTE ABDOMEN SERIES (ABDOMEN 2 VIEW & CHEST 1 VIEW)  Comparison: Chest radiograph on 03/31/2004  Findings: Several gas filled, nondilated small bowel loops are seen in the lower abdomen pelvis.  Colonic gas is also seen without dilatation. There are multiple scattered air fluid levels.  No evidence of free air.  Small calcifications in the central pelvis most likely represent tiny calcified fibroids.  Moderate cardiomegaly and pulmonary vascular congestion are seen. No evidence of pulmonary infiltrate or edema.  No evidence of pleural effusion.  No definite mass or lymphadenopathy identified.  IMPRESSION:  1.  Nonspecific, nonobstructive bowel gas pattern. 2.  Cardiomegaly and pulmonary vascular congestion.  No active lung disease.   Original Report Authenticated By: Myles Rosenthal, M.D.      1. Back pain       MDM          Benny Lennert, MD 03/06/12 [phone]

## 2012-03-06 NOTE — ED Notes (Signed)
No distress noted with pt.  She states that she has been hurting for several days.  The pain does increase with movement.  Denies nausea.  States that she feels most comfortable.

## 2012-03-06 NOTE — ED Notes (Signed)
Pt A.O.x  4. NAD. Daughter verbalized understanding of diagnosis and perscpition administration. Denies chest pain. Denies SOB. Denies any new pain. Respirations even and regular. No further needs at this time.

## 2012-03-06 NOTE — ED Notes (Addendum)
Pt sitting up in wheel chair per request. States pain in upper and lower back in abdomen is unchanged since arrival. Denies SOB. Denies N/V/D/C. Denies chest pain. A.O. X 4. Pt and family poor historian.  When asked about reason for right groin incision on 08/17/11  Pt states " I don't know". Family states "to explore something". Updated on plan of care: aware that  Pt is currently awaiting x-ray. No further needs at this time.

## 2012-03-06 NOTE — ED Notes (Signed)
Pt c/o lower back pain and abd pain x 6 days; pt sent from [facility] for further eval

## 2012-03-06 NOTE — ED Notes (Signed)
Patient transported to X-ray 

## 2012-06-19 ENCOUNTER — Other Ambulatory Visit (HOSPITAL_COMMUNITY): Payer: Self-pay | Admitting: Internal Medicine

## 2012-06-19 DIAGNOSIS — Z1231 Encounter for screening mammogram for malignant neoplasm of breast: Secondary | ICD-10-CM

## 2012-07-07 ENCOUNTER — Ambulatory Visit (HOSPITAL_COMMUNITY)
Admission: RE | Admit: 2012-07-07 | Discharge: 2012-07-07 | Disposition: A | Discharge status: Home / Self Care | Payer: Medicare Other | Source: Ambulatory Visit | Attending: Internal Medicine | Admitting: Internal Medicine

## 2012-07-07 DIAGNOSIS — Z1231 Encounter for screening mammogram for malignant neoplasm of breast: Secondary | ICD-10-CM | POA: Insufficient documentation

## 2012-08-22 ENCOUNTER — Other Ambulatory Visit: Payer: Self-pay | Admitting: Physician Assistant

## 2012-08-22 ENCOUNTER — Other Ambulatory Visit: Payer: Self-pay | Admitting: Cardiovascular Disease

## 2012-10-02 ENCOUNTER — Ambulatory Visit: Payer: Medicare Other

## 2012-10-02 ENCOUNTER — Ambulatory Visit (INDEPENDENT_AMBULATORY_CARE_PROVIDER_SITE_OTHER): Payer: Medicare Other | Admitting: Family Medicine

## 2012-10-02 DIAGNOSIS — N289 Disorder of kidney and ureter, unspecified: Secondary | ICD-10-CM

## 2012-10-02 DIAGNOSIS — M7989 Other specified soft tissue disorders: Secondary | ICD-10-CM

## 2012-10-02 DIAGNOSIS — M79609 Pain in unspecified limb: Secondary | ICD-10-CM

## 2012-10-02 LAB — GLUCOSE, POCT (MANUAL RESULT ENTRY): POC Glucose: 114 mg/dl — AB (ref 70–99)

## 2012-10-02 LAB — POCT CBC
Granulocyte percent: 64.1 %G (ref 37–80)
Hemoglobin: 10.9 g/dL — AB (ref 12.2–16.2)
MID (cbc): 0.9 (ref 0–0.9)
MPV: 9.5 fL (ref 0–99.8)
POC MID %: 11.2 %M (ref 0–12)
Platelet Count, POC: 174 10*3/uL (ref 142–424)
RBC: 3.48 M/uL — AB (ref 4.04–5.48)

## 2012-10-02 LAB — POCT SEDIMENTATION RATE: POCT SED RATE: 74 mm/hr — AB (ref 0–22)

## 2012-10-02 MED ORDER — METHYLPREDNISOLONE SODIUM SUCC 125 MG IJ SOLR
67.5000 mg | Freq: Once | INTRAMUSCULAR | Status: AC
  Administered 2012-10-02: 67.5 mg via INTRAMUSCULAR

## 2012-10-02 MED ORDER — COLCHICINE 0.6 MG PO TABS: ORAL_TABLET | ORAL | Status: DC

## 2012-10-02 MED ORDER — PREDNISONE 20 MG PO TABS: ORAL_TABLET | ORAL | Status: DC

## 2012-10-02 NOTE — Patient Instructions (Addendum)
Elevate  Ice  Prednisone beginning tomorrow morning take 2 pills daily for 2 days, then one daily for 4 days  Take the Uloric (colchicine) one twice daily for 3 days, then once daily.  Decrease or hold if diarrhea  Return Saturday for recheck

## 2012-10-02 NOTE — Progress Notes (Signed)
Subjective: 79 year old lady who comes in here complaining of left hand swelling and pain for about 4 days. Knows of no injury. Has not had this problem in the past. She has never had gout. She is on diuretics. Review of her old records reveals that she has had some renal insufficiency. I do not see any kidney function tests for about 7 months. However she was going to a kidney doctor on [address] who released her a couple of months ago.  Objective: Left hand is swollen from about 6 inches above the wrist on down through the fingers. It is tight and warm to touch. It is not extremely red. No axillary nodes. She points to the fingers as the place of maximum pain.  Assessment: Left hand swelling and pain, etiology unclear. Doubt would be a possibility because of her history of some renal insufficiency Renal insufficiency  Plan: Check labs and x-ray  [facility] reading (PRIMARY) by  Dr. Alwyn Ren Swelling.  Osteoporosis.  No acute problem identified.  Results for orders placed in visit on 10/02/12  POCT CBC      Result Value Range   WBC 8.0  4.6 - 10.2 K/uL   Lymph, poc 2.0  0.6 - 3.4   POC LYMPH PERCENT 24.7  10 - 50 %L   MID (cbc) 0.9  0 - 0.9   POC MID % 11.2  0 - 12 %M   POC Granulocyte 5.1  2 - 6.9   Granulocyte percent 64.1  37 - 80 %G   RBC 3.48 (*) 4.04 - 5.48 M/uL   Hemoglobin 10.9 (*) 12.2 - 16.2 g/dL   HCT, POC 45.4 (*) 09.8 - 47.9 %   MCV 101.0 (*) 80 - 97 fL   MCH, POC 31.3 (*) 27 - 31.2 pg   MCHC 31.1 (*) 31.8 - 35.4 g/dL   RDW, POC 11.9     Platelet Count, POC 174  142 - 424 K/uL   MPV 9.5  0 - 99.8 fL  GLUCOSE, POCT (MANUAL RESULT ENTRY)      Result Value Range   POC Glucose 114 (*) 70 - 99 mg/dl   . This is highly suspicious for gout. Will place on colchicine twice daily and a prednisone taper. Elevate and ice. Return in 2 days, sooner if worse

## 2012-10-03 LAB — BASIC METABOLIC PANEL
Chloride: 97 mEq/L (ref 96–112)
Potassium: 3.7 mEq/L (ref 3.5–5.3)

## 2012-10-04 ENCOUNTER — Ambulatory Visit (INDEPENDENT_AMBULATORY_CARE_PROVIDER_SITE_OTHER): Payer: Medicare Other | Admitting: Family Medicine

## 2012-10-04 VITALS — BP 130/68 | HR 60 | Temp 97.6°F | Resp 17 | Ht 67.0 in | Wt 148.0 lb

## 2012-10-04 DIAGNOSIS — N289 Disorder of kidney and ureter, unspecified: Secondary | ICD-10-CM

## 2012-10-04 DIAGNOSIS — M1990 Unspecified osteoarthritis, unspecified site: Secondary | ICD-10-CM

## 2012-10-04 DIAGNOSIS — M129 Arthropathy, unspecified: Secondary | ICD-10-CM

## 2012-10-04 DIAGNOSIS — D649 Anemia, unspecified: Secondary | ICD-10-CM

## 2012-10-04 DIAGNOSIS — M1030 Gout due to renal impairment, unspecified site: Secondary | ICD-10-CM

## 2012-10-04 LAB — RHEUMATOID FACTOR: Rhuematoid fact SerPl-aCnc: <10 IU/mL (ref <=14)

## 2012-10-04 NOTE — Patient Instructions (Addendum)
Continue same medication  Return to see me on Thursday morning or followup with your primary care  Drink plenty of water     Your labs from early in the week showed:  Results for orders placed in visit on 10/02/12  BASIC METABOLIC PANEL      Result Value Range   Sodium 136  135 - 145 mEq/L   Potassium 3.7  3.5 - 5.3 mEq/L   Chloride 97  96 - 112 mEq/L   CO2 25  19 - 32 mEq/L   Glucose, Bld 105 (*) 70 - 99 mg/dL   BUN 37 (*) 6 - 23 mg/dL   Creat 1.19 (*) 1.47 - 1.10 mg/dL   Calcium 9.3  8.4 - 82.9 mg/dL  URIC ACID      Result Value Range   Uric Acid, Serum 10.4 (*) 2.4 - 7.0 mg/dL  POCT CBC      Result Value Range   WBC 8.0  4.6 - 10.2 K/uL   Lymph, poc 2.0  0.6 - 3.4   POC LYMPH PERCENT 24.7  10 - 50 %L   MID (cbc) 0.9  0 - 0.9   POC MID % 11.2  0 - 12 %M   POC Granulocyte 5.1  2 - 6.9   Granulocyte percent 64.1  37 - 80 %G   RBC 3.48 (*) 4.04 - 5.48 M/uL   Hemoglobin 10.9 (*) 12.2 - 16.2 g/dL   HCT, POC 56.2 (*) 13.0 - 47.9 %   MCV 101.0 (*) 80 - 97 fL   MCH, POC 31.3 (*) 27 - 31.2 pg   MCHC 31.1 (*) 31.8 - 35.4 g/dL   RDW, POC 86.5     Platelet Count, POC 174  142 - 424 K/uL   MPV 9.5  0 - 99.8 fL  GLUCOSE, POCT (MANUAL RESULT ENTRY)      Result Value Range   POC Glucose 114 (*) 70 - 99 mg/dl  POCT SEDIMENTATION RATE      Result Value Range   POCT SED RATE 74 (*) 0 - 22 mm/hr    The high BUN and Creat are from decreased kidney function, but not as bad as 7 months ago in your old medical record.  This comes from old age.  The high uric acid should be less than 6.2 in someone with gout, so we will keep working on that. The high sedimentation rate is also from the gout. The low hemoglobin of 10.9 means that you are a little bit anemic, but that is the same as it was last time. This occurs commonly in elderly people.

## 2012-10-04 NOTE — Progress Notes (Signed)
Subjective: Hand still hurts and is swollen, but is not as bad as it was. She is here for followup with regard to her studies. Has been taking her medications.  Objective: The radiologist felt like the x-rays were more consistent with report arthritis. In deed the patient has had intermittent swelling of some other joints, but that too could of been gout. The uric acid level is quite high, greater than 10. She does have renal insufficiency.  The swelling is less pronounced in the lower part of the forearm though the hand and joints of the hand is still very puffy and tender. It doesn't feel quite as hot to touch.  Assessment: Acute gouty arthritis secondary to renal insufficiency Renal insufficiency Rule out rheumatoid arthritis  Plan: Because of the radiologist assessment we will check a rheumatoid factor. If it is positive she should see a rheumatologist. Assuming that everything is still consistent with this being gout, I want to see her back on Thursday to recheck it one more time. At that point she probably should be placed on a low dose of allopurinol. Encouraged her to be taking more liquids. Discussed with patient and her daughter.

## 2012-10-05 ENCOUNTER — Encounter: Payer: Self-pay | Admitting: Family Medicine

## 2012-10-09 ENCOUNTER — Ambulatory Visit (INDEPENDENT_AMBULATORY_CARE_PROVIDER_SITE_OTHER): Payer: Medicare Other | Admitting: Family Medicine

## 2012-10-09 VITALS — BP 142/58 | HR 62 | Temp 97.9°F | Resp 18 | Ht 66.0 in | Wt 151.0 lb

## 2012-10-09 DIAGNOSIS — I10 Essential (primary) hypertension: Secondary | ICD-10-CM

## 2012-10-09 DIAGNOSIS — M1039 Gout due to renal impairment, multiple sites: Secondary | ICD-10-CM | POA: Insufficient documentation

## 2012-10-09 MED ORDER — ALLOPURINOL 100 MG PO TABS: 100.0000 mg | ORAL_TABLET | Freq: Every day | ORAL | Status: DC

## 2012-10-09 NOTE — Patient Instructions (Addendum)
Make sure you talk with Dr. Allyson Sabal at your next appointment about changing your blood pressure medication - the hydrochlorothiazide that you are on can sometimes make gout worse.  Follow up in about a month - at that time we can recheck your uric acid (gout blood level) to make sure you are on an adequate dose of the allopurinol as well as rechecking on your kidneys and liver to ensure the new medicine is not irritating them at all.  At the first sign of a gout flair, take a colchicine.  If you do not have diarrhea, you can repeat a second dose 2 hours later - hopefully that will stop the attack but if it continues, make sure you come to clinic for evaluation to see if you need another course of prednisone.  Gout Gout is an inflammatory condition (arthritis) caused by a buildup of uric acid crystals in the joints. Uric acid is a chemical that is normally present in the blood. Under some circumstances, uric acid can form into crystals in your joints. This causes joint redness, soreness, and swelling (inflammation). Repeat attacks are common. Over time, uric acid crystals can form into masses (tophi) near a joint, causing disfigurement. Gout is treatable and often preventable. CAUSES  The disease begins with elevated levels of uric acid in the blood. Uric acid is produced by your body when it breaks down a naturally found substance called purines. This also happens when you eat certain foods such as meats and fish. Causes of an elevated uric acid level include:  Being passed down from parent to child (heredity).  Diseases that cause increased uric acid production (obesity, psoriasis, some cancers).  Excessive alcohol use.  Diet, especially diets rich in meat and seafood.  Medicines, including certain cancer-fighting drugs (chemotherapy), diuretics, and aspirin.  Chronic kidney disease. The kidneys are no longer able to remove uric acid well.  Problems with metabolism. Conditions strongly associated  with gout include:  Obesity.  High blood pressure.  High cholesterol.  Diabetes. Not everyone with elevated uric acid levels gets gout. It is not understood why some people get gout and others do not. Surgery, joint injury, and eating too much of certain foods are some of the factors that can lead to gout. SYMPTOMS   An attack of gout comes on quickly. It causes intense pain with redness, swelling, and warmth in a joint.  Fever can occur.  Often, only one joint is involved. Certain joints are more commonly involved:  Base of the big toe.  Knee.  Ankle.  Wrist.  Finger. Without treatment, an attack usually goes away in a few days to weeks. Between attacks, you usually will not have symptoms, which is different from many other forms of arthritis. DIAGNOSIS  Your caregiver will suspect gout based on your symptoms and exam. Removal of fluid from the joint (arthrocentesis) is done to check for uric acid crystals. Your caregiver will give you a medicine that numbs the area (local anesthetic) and use a needle to remove joint fluid for exam. Gout is confirmed when uric acid crystals are seen in joint fluid, using a special microscope. Sometimes, blood, urine, and X-ray tests are also used. TREATMENT  There are 2 phases to gout treatment: treating the sudden onset (acute) attack and preventing attacks (prophylaxis). Treatment of an Acute Attack  Medicines are used. These include anti-inflammatory medicines or steroid medicines.  An injection of steroid medicine into the affected joint is sometimes necessary.  The painful joint is  rested. Movement can worsen the arthritis.  You may use warm or cold treatments on painful joints, depending which works best for you.  Discuss the use of coffee, vitamin C, or cherries with your caregiver. These may be helpful treatment options. Treatment to Prevent Attacks After the acute attack subsides, your caregiver may advise prophylactic medicine.  These medicines either help your kidneys eliminate uric acid from your body or decrease your uric acid production. You may need to stay on these medicines for a very long time. The early phase of treatment with prophylactic medicine can be associated with an increase in acute gout attacks. For this reason, during the first few months of treatment, your caregiver may also advise you to take medicines usually used for acute gout treatment. Be sure you understand your caregiver's directions. You should also discuss dietary treatment with your caregiver. Certain foods such as meats and fish can increase uric acid levels. Other foods such as dairy can decrease levels. Your caregiver can give you a list of foods to avoid. HOME CARE INSTRUCTIONS   Do not take aspirin to relieve pain. This raises uric acid levels.  Only take over-the-counter or prescription medicines for pain, discomfort, or fever as directed by your caregiver.  Rest the joint as much as possible. When in bed, keep sheets and blankets off painful areas.  Keep the affected joint raised (elevated).  Use crutches if the painful joint is in your leg.  Drink enough water and fluids to keep your urine clear or pale yellow. This helps your body get rid of uric acid. Do not drink alcoholic beverages. They slow the passage of uric acid.  Follow your caregiver's dietary instructions. Pay careful attention to the amount of protein you eat. Your daily diet should emphasize fruits, vegetables, whole grains, and fat-free or low-fat milk products.  Maintain a healthy body weight. SEEK MEDICAL CARE IF:   You have an oral temperature above 102 F (38.9 C).  You develop diarrhea, vomiting, or any side effects from medicines.  You do not feel better in 24 hours, or you are getting worse. SEEK IMMEDIATE MEDICAL CARE IF:   Your joint becomes suddenly more tender and you have:  Chills.  An oral temperature above 102 F (38.9 C), not controlled  by medicine. MAKE SURE YOU:   Understand these instructions.  Will watch your condition.  Will get help right away if you are not doing well or get worse. Document Released: 03/16/2000 Document Revised: 06/11/2011 Document Reviewed: 06/27/2009 [facility] Patient Information 2014 [place], [place].

## 2012-10-09 NOTE — Progress Notes (Signed)
  Subjective:    Patient ID: Meghan Russo, female    DOB: 08-15-1920, 79 y.o.   MRN: [id number] Chief Complaint  Patient presents with  . Follow-up   HPI  Meghan Russo is here to follow-up on her gout flair in her left wrist and hand.  Her uric acid level was quite high at 10 w/ a ESR of 7.4 but her xray was read as concerning for poss RA so at last visit a RF was done which was negative.  She was put on a course of prednisone and colchicine and her symptoms have improved dramatically since then.  She stopped the colchicine as she got severe diarrhea immed  Past Medical History  Diagnosis Date  . Hypertension   . Knee pain   . Edema   . Chronic kidney disease, stage III (moderate)   . Benign hypertensive kidney disease with chronic kidney disease stage I through stage IV, or unspecified(403.10)    Current Outpatient Prescriptions on File Prior to Visit  Medication Sig Dispense Refill  . aspirin 81 MG tablet Take 81 mg by mouth daily.      . carvedilol (COREG) 6.25 MG tablet TAKE 1 TABLET TWICE DAILY  60 tablet  5  . Cholecalciferol (VITAMIN D) 2000 UNITS CAPS Take 1 capsule by mouth daily.       . colchicine 0.6 MG tablet Take one twice daily for 3 days, then once daily for possible gout  20 tablet  0  . furosemide (LASIX) 40 MG tablet Take 40 mg by mouth 2 (two) times daily.      . furosemide (LASIX) 40 MG tablet TAKE 1 TABLET BY MOUTH TWICE A DAY  60 tablet  4  . hydrALAZINE (APRESOLINE) 50 MG tablet Take 50 mg by mouth 3 (three) times daily.      Marland Kitchen lisinopril-hydrochlorothiazide (PRINZIDE,ZESTORETIC) 20-25 MG per tablet Take 1 tablet by mouth daily.      . Multiple Vitamins-Minerals (MULTIVITAMIN WITH MINERALS) tablet Take 1 tablet by mouth daily.      . traMADol (ULTRAM) 50 MG tablet Take 1 tablet (50 mg total) by mouth every 8 (eight) hours as needed for pain.  20 tablet  0   No current facility-administered medications on file prior to visit.   Allergies  Allergen Reactions  . Asa  [Aspirin] Other (See Comments)    GI Upset     Review of Systems    BP 142/58  Pulse 62  Temp(Src) 97.9 F (36.6 C) (Oral)  Resp 18  Ht 5\' 6"  (1.676 m)  Wt 151 lb (68.493 kg)  BMI 24.38 kg/m2 Objective:   Physical Exam        Assessment & Plan:  Forward note to [employer] [facility]. At next visit, cons repeating sed rate and uric acid, recheck cmp, think about confirming neg RA with an anti-ccp test. 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 allopurinol (ZYLOPRIM) 100 MG tablet    Sig: Take 1 tablet (100 mg total) by mouth daily.    Dispense:  30 tablet    Refill:  3

## 2012-11-19 ENCOUNTER — Telehealth: Payer: Self-pay | Admitting: Cardiovascular Disease

## 2012-11-19 NOTE — Telephone Encounter (Signed)
Message forwarded to Dr. Allyson Sabal for review and further instructions.  Paper chart# [id number] on Dr. Hazle Coca cart.

## 2012-11-19 NOTE — Telephone Encounter (Signed)
Patient was seen at Urgent Care for gout.  Could some of her medications be causing this?

## 2012-11-26 NOTE — Telephone Encounter (Signed)
Her diuretics including her hydrochlorothiazide can certainly be worsening her gout. Please have her see her primary care physician for further evaluation of this

## 2012-11-26 NOTE — Telephone Encounter (Signed)
Returned call and spoke w/ Victorino Dike.  Informed per Dr. Allyson Sabal.  Verbalized understanding and agreed w/ plan.

## 2012-12-11 ENCOUNTER — Ambulatory Visit (INDEPENDENT_AMBULATORY_CARE_PROVIDER_SITE_OTHER): Payer: Medicare Other | Admitting: Cardiology

## 2012-12-11 ENCOUNTER — Encounter: Payer: Self-pay | Admitting: Cardiology

## 2012-12-11 VITALS — BP 130/50 | HR 56 | Ht 66.0 in | Wt 145.3 lb

## 2012-12-11 DIAGNOSIS — N189 Chronic kidney disease, unspecified: Secondary | ICD-10-CM

## 2012-12-11 DIAGNOSIS — I340 Nonrheumatic mitral (valve) insufficiency: Secondary | ICD-10-CM

## 2012-12-11 DIAGNOSIS — I059 Rheumatic mitral valve disease, unspecified: Secondary | ICD-10-CM

## 2012-12-11 DIAGNOSIS — I350 Nonrheumatic aortic (valve) stenosis: Secondary | ICD-10-CM

## 2012-12-11 DIAGNOSIS — I10 Essential (primary) hypertension: Secondary | ICD-10-CM

## 2012-12-11 DIAGNOSIS — I359 Nonrheumatic aortic valve disorder, unspecified: Secondary | ICD-10-CM

## 2012-12-11 NOTE — Progress Notes (Signed)
12/16/2012 Meghan Russo   [birth year]  [id number]  Primary Physicia [name], MD Primary Cardiologist: Dr Allyson Sabal  HPI:  Pleasant 79 y/o female follwed by Dr Allyson Sabal with a history of AS and MR. She has been asymptomatic. She denies any chest pain, DOE, orthopnea, syncope or near syncope. Her last echo was June 2013. She is here for a 6 month check up. Since we saw her last she has had no cardiac issues. She did have some arthritis in her hands that is now better. Her B/P medication was recently adjusted by Dr Allyne Gee and the pt has a follow up apt in 2 weeks. She has baseline bradycardia but appears to be asymptomatic from this.   Current Outpatient Prescriptions  Medication Sig Dispense Refill  . allopurinol (ZYLOPRIM) 100 MG tablet Take 1 tablet (100 mg total) by mouth daily.  30 tablet  3  . aspirin 81 MG tablet Take 81 mg by mouth daily.      . carvedilol (COREG) 6.25 MG tablet TAKE 1 TABLET TWICE DAILY  60 tablet  5  . Cholecalciferol (VITAMIN D) 2000 UNITS CAPS Take 1 capsule by mouth daily.       . colchicine 0.6 MG tablet Take one twice daily for 3 days, then once daily for possible gout  20 tablet  0  . furosemide (LASIX) 40 MG tablet TAKE 1 TABLET BY MOUTH TWICE A DAY  60 tablet  4  . hydrALAZINE (APRESOLINE) 50 MG tablet Take 50 mg by mouth 3 (three) times daily.      Marland Kitchen lisinopril-hydrochlorothiazide (PRINZIDE,ZESTORETIC) 20-25 MG per tablet Take 1 tablet by mouth daily.      . Multiple Vitamins-Minerals (MULTIVITAMIN WITH MINERALS) tablet Take 1 tablet by mouth daily.      . traMADol (ULTRAM) 50 MG tablet Take 1 tablet (50 mg total) by mouth every 8 (eight) hours as needed for pain.  20 tablet  0   No current facility-administered medications for this visit.    Allergies  Allergen Reactions  . Asa [Aspirin] Other (See Comments)    GI Upset    History   Social History  . Marital Status: Widowed    Spouse Name: N/A    Number of Children: N/A  . Years of Education: N/A    Occupational History  . Not on file.   Social History Main Topics  . Smoking status: Never Smoker   . Smokeless tobacco: Not on file  . Alcohol Use: No  . Drug Use: No  . Sexual Activity:    Other Topics Concern  . Not on file   Social History Narrative  . No narrative on file     Review of Systems: General: negative for chills, fever, night sweats or weight changes.  Cardiovascular: negative for chest pain, dyspnea on exertion, edema, orthopnea, palpitations, paroxysmal nocturnal dyspnea or shortness of breath Dermatological: negative for rash Respiratory: negative for cough or wheezing Urologic: negative for hematuria Abdominal: negative for nausea, vomiting, diarrhea, bright red blood per rectum, melena, or hematemesis Neurologic: negative for visual changes, syncope, or dizziness All other systems reviewed and are otherwise negative except as noted above.    Blood pressure 130/50, pulse 56, height 5\' 6"  (1.676 m), weight 145 lb 4.8 oz (65.908 kg).  General appearance: alert, cooperative, cachectic and no distress Neck: no JVD and transmitted murmur to carotids Lungs: clear to auscultation bilaterally Heart: regular rate and rhythm and 2/6 systolic murmur heard throught the precordium but loudest at  the aortic area. She also has a 1/6 diastloic blow at the LSB. Extremities: no edema  EKG NSR, SB, LVH, poor ant RW (old)  ASSESSMENT AND PLAN:   Moderate aortic stenosis No chest pain or syncope  Moderate mitral regurgitation No SOB or edema  Essential hypertension, benign Hydralazine recently adjusted by Dr Allyne Gee  Chronic renal insufficiency, stage III (moderate) SCr 1.28 - March 2014.    PLAN  Same Rx for now. She will follow up with Dr Allyson Sabal in 6 months. I discussed the signs and symptoms of CHF, angina, and severe AS with the pt and her daughter who accompamnied her today. The pt knows to contact us if she has any issues.   Carlosdaniel Grob  KPA-C 12/16/2012 10:44 AM

## 2012-12-11 NOTE — Assessment & Plan Note (Addendum)
Hydralazine recently adjusted by Dr Allyne Gee

## 2012-12-11 NOTE — Patient Instructions (Signed)
Your physician recommends that you schedule a follow-up appointment in: 6 months. Call if you have any problems before that.

## 2012-12-11 NOTE — Assessment & Plan Note (Signed)
No SOB or edema

## 2012-12-11 NOTE — Assessment & Plan Note (Signed)
No chest pain or syncope 

## 2012-12-11 NOTE — Assessment & Plan Note (Addendum)
SCr 1.28 - March 2014.

## 2012-12-16 ENCOUNTER — Encounter: Payer: Self-pay | Admitting: Cardiology

## 2013-01-19 ENCOUNTER — Other Ambulatory Visit: Payer: Self-pay | Admitting: Cardiovascular Disease

## 2013-01-19 NOTE — Telephone Encounter (Signed)
Rx was sent to pharmacy electronically. 

## 2013-02-17 ENCOUNTER — Other Ambulatory Visit: Payer: Self-pay | Admitting: Cardiovascular Disease

## 2013-02-17 NOTE — Telephone Encounter (Signed)
Rx was sent to pharmacy electronically. 

## 2013-02-22 ENCOUNTER — Other Ambulatory Visit: Payer: Self-pay | Admitting: Cardiovascular Disease

## 2013-03-27 ENCOUNTER — Other Ambulatory Visit: Payer: Self-pay | Admitting: Cardiovascular Disease

## 2013-03-30 NOTE — Telephone Encounter (Signed)
Rx was sent to pharmacy electronically. 

## 2013-04-25 ENCOUNTER — Other Ambulatory Visit: Payer: Self-pay | Admitting: Cardiovascular Disease

## 2013-04-27 NOTE — Telephone Encounter (Signed)
Rx was sent to pharmacy electronically. 

## 2013-06-05 ENCOUNTER — Encounter: Payer: Self-pay | Admitting: Cardiovascular Disease

## 2013-06-05 ENCOUNTER — Ambulatory Visit (INDEPENDENT_AMBULATORY_CARE_PROVIDER_SITE_OTHER): Payer: Medicare Other | Admitting: Cardiovascular Disease

## 2013-06-05 VITALS — BP 180/60 | HR 55 | Ht 66.0 in | Wt 140.2 lb

## 2013-06-05 DIAGNOSIS — I10 Essential (primary) hypertension: Secondary | ICD-10-CM

## 2013-06-05 DIAGNOSIS — I359 Nonrheumatic aortic valve disorder, unspecified: Secondary | ICD-10-CM

## 2013-06-05 DIAGNOSIS — I350 Nonrheumatic aortic (valve) stenosis: Secondary | ICD-10-CM

## 2013-06-05 NOTE — Assessment & Plan Note (Signed)
Given the patient's age and lack of symptoms I cannot compelled to continue to follow this. She does have a typical aortic stenosis murmur but denies chest pain, shortness of breath or dizziness.

## 2013-06-05 NOTE — Progress Notes (Signed)
06/05/2013 Meghan Russo   09/19/1920  [id number]  Primary Physician Gwynneth AlimentSANDERS,ROBYN N, MD Primary Cardiologist: Runell GessJonathan J. Venba Zenner MD Roseanne RenoFACP,FACC,FAHA, FSCAI   HPI:  The patient is a 80 year old, thin and frail-appearing widowed [place] female, mother of 3, grandmother to 2 grandchildren accompanied by one of her daughters today. I saw her 6 months ago. She has a history of labile hypertension, negative renal Dopplers and valvular heart disease with mild AI, mild to moderate AS, moderate MR and severe TR with severe pulmonary hypertension. She is asymptomatic. Her last echo performed September 03, 2011     Current Outpatient Prescriptions  Medication Sig Dispense Refill  . allopurinol (ZYLOPRIM) 100 MG tablet Take 1 tablet (100 mg total) by mouth daily.  30 tablet  3  . aspirin 81 MG tablet Take 81 mg by mouth daily.      . carvedilol (COREG) 6.25 MG tablet TAKE 1 TABLET BY MOUTH TWICE A DAY  60 tablet  5  . Cholecalciferol (VITAMIN D) 2000 UNITS CAPS Take 1 capsule by mouth daily.       . colchicine 0.6 MG tablet Take one twice daily for 3 days, then once daily for possible gout  20 tablet  0  . furosemide (LASIX) 40 MG tablet TAKE 1 TABLET BY MOUTH TWICE A DAY  60 tablet  8  . hydrALAZINE (APRESOLINE) 50 MG tablet TAKE 1 TABLET 3 TIMES DAILY.  90 tablet  8  . lisinopril-hydrochlorothiazide (PRINZIDE,ZESTORETIC) 20-25 MG per tablet Take 1 tablet by mouth daily.      . Multiple Vitamins-Minerals (MULTIVITAMIN WITH MINERALS) tablet Take 1 tablet by mouth daily.      . traMADol (ULTRAM) 50 MG tablet Take 1 tablet (50 mg total) by mouth every 8 (eight) hours as needed for pain.  20 tablet  0   No current facility-administered medications for this visit.    Allergies  Allergen Reactions  . Asa [Aspirin] Other (See Comments)    GI Upset    History   Social History  . Marital Status: Widowed    Spouse Name: N/A    Number of Children: N/A  . Years of Education: N/A    Occupational History  . Not on file.   Social History Main Topics  . Smoking status: Never Smoker   . Smokeless tobacco: Not on file  . Alcohol Use: No  . Drug Use: No  . Sexual Activity:    Other Topics Concern  . Not on file   Social History Narrative  . No narrative on file     Review of Systems: General: negative for chills, fever, night sweats or weight changes.  Cardiovascular: negative for chest pain, dyspnea on exertion, edema, orthopnea, palpitations, paroxysmal nocturnal dyspnea or shortness of breath Dermatological: negative for rash Respiratory: negative for cough or wheezing Urologic: negative for hematuria Abdominal: negative for nausea, vomiting, diarrhea, bright red blood per rectum, melena, or hematemesis Neurologic: negative for visual changes, syncope, or dizziness All other systems reviewed and are otherwise negative except as noted above.    Blood pressure 180/60, pulse 55, height 5\' 6"  (1.676 m), weight 63.594 kg (140 lb 3.2 oz).  General appearance: alert and no distress Neck: no adenopathy, no JVD, supple, symmetrical, trachea midline, thyroid not enlarged, symmetric, no tenderness/mass/nodules and left carotid bruit Lungs: clear to auscultation bilaterally Heart: typical aortic stenosis murmur Extremities: extremities normal, atraumatic, no cyanosis or edema  EKG sinus bradycardia at 55 without ST or T  wave changes  ASSESSMENT AND PLAN:   Moderate aortic stenosis Given the patient's age and lack of symptoms I cannot compelled to continue to follow this. She does have a typical aortic stenosis murmur but denies chest pain, shortness of breath or dizziness.  Essential hypertension, benign Patient's blood pressure is fairly labile. Is 180/60 today. At this point I'm not sure the blood pressure is at home and don't compelled to change her medications      Runell Gess MD [facility] 06/05/2013 11:42 AM

## 2013-06-05 NOTE — Patient Instructions (Signed)
Dr Berry wants you to follow-up in 1 year . You will receive a reminder letter in the mail two months in advance. If you don't receive a letter, please call our office to schedule the follow-up appointment. 

## 2013-06-05 NOTE — Assessment & Plan Note (Addendum)
Patient's blood pressure is fairly labile. Is 180/60 today. At this point I'm not sure the blood pressure is at home and don't compelled to change her medications

## 2013-06-08 ENCOUNTER — Other Ambulatory Visit: Payer: Self-pay | Admitting: Family Medicine

## 2013-06-17 ENCOUNTER — Other Ambulatory Visit: Payer: Self-pay

## 2013-06-17 MED ORDER — CARVEDILOL 6.25 MG PO TABS: 6.2500 mg | ORAL_TABLET | Freq: Two times a day (BID) | ORAL | Status: DC

## 2013-06-17 MED ORDER — FUROSEMIDE 40 MG PO TABS: 40.0000 mg | ORAL_TABLET | Freq: Two times a day (BID) | ORAL | Status: DC

## 2013-06-17 MED ORDER — HYDRALAZINE HCL 50 MG PO TABS: 50.0000 mg | ORAL_TABLET | Freq: Three times a day (TID) | ORAL | Status: DC

## 2013-06-17 NOTE — Telephone Encounter (Signed)
Rx was sent to pharmacy electronically. 

## 2013-11-14 ENCOUNTER — Ambulatory Visit (INDEPENDENT_AMBULATORY_CARE_PROVIDER_SITE_OTHER): Payer: Medicare Other | Admitting: Family Medicine

## 2013-11-14 ENCOUNTER — Ambulatory Visit (INDEPENDENT_AMBULATORY_CARE_PROVIDER_SITE_OTHER): Payer: Medicare Other

## 2013-11-14 VITALS — BP 118/62 | HR 66 | Temp 97.7°F | Resp 16

## 2013-11-14 DIAGNOSIS — B351 Tinea unguium: Secondary | ICD-10-CM

## 2013-11-14 DIAGNOSIS — L02619 Cutaneous abscess of unspecified foot: Secondary | ICD-10-CM

## 2013-11-14 DIAGNOSIS — M109 Gout, unspecified: Secondary | ICD-10-CM

## 2013-11-14 DIAGNOSIS — L03116 Cellulitis of left lower limb: Secondary | ICD-10-CM

## 2013-11-14 DIAGNOSIS — L03119 Cellulitis of unspecified part of limb: Secondary | ICD-10-CM

## 2013-11-14 DIAGNOSIS — N183 Chronic kidney disease, stage 3 unspecified: Secondary | ICD-10-CM

## 2013-11-14 DIAGNOSIS — M7989 Other specified soft tissue disorders: Secondary | ICD-10-CM

## 2013-11-14 MED ORDER — ACETAMINOPHEN-CODEINE #3 300-30 MG PO TABS: 1.0000 | ORAL_TABLET | ORAL | Status: DC | PRN

## 2013-11-14 MED ORDER — NYSTATIN 100000 UNIT/GM EX POWD: 1.0000 g | Freq: Three times a day (TID) | CUTANEOUS | Status: DC

## 2013-11-14 MED ORDER — DOXYCYCLINE HYCLATE 100 MG PO CAPS: 100.0000 mg | ORAL_CAPSULE | Freq: Two times a day (BID) | ORAL | Status: DC

## 2013-11-14 NOTE — Patient Instructions (Signed)

## 2013-11-14 NOTE — Progress Notes (Signed)
Subjective:    Patient ID: Meghan ChanceJanie H Zarling, female    DOB: 03/18/1921, 80 y.o.   MRN: [id number] Chief Complaint  Patient presents with  . Foot Problem    left foot, painful at night, sores around ankle and toes,     HPI  Left foot and ankle painful at night for over 3 weeks.  She initially developed skin breakdown on lateral and medial malleolus which she was putting antibiotic ointment on but then it spread to the entire foot and ankle being red.  Not currently using any topicals but using some prn tylenol w/o much relief  She has a h/o gout due to renal impairment and was prev on allopurinol but ran out of it.  H/o some fungal infection in her foot prior.  PCP is Dr. Allyne GeeSanders - saw her last mo but that was before this prob started.  She liver w/ a daughter and her nephew and is here today with a different daughter.  Past Medical History  Diagnosis Date  . Hypertension   . Knee pain   . Edema   . Chronic kidney disease, stage III (moderate)   . Benign hypertensive kidney disease with chronic kidney disease stage I through stage IV, or unspecified   . Moderate aortic stenosis     moderate MR, severe TR  . Moderate to severe pulmonary hypertension   . Arthritis   . Cataract    Current Outpatient Prescriptions on File Prior to Visit  Medication Sig Dispense Refill  . allopurinol (ZYLOPRIM) 100 MG tablet Take 1 tablet (100 mg total) by mouth daily. PATIENT NEEDS OFFICE VISIT FOR ADDITIONAL REFILLS  30 tablet  0  . aspirin 81 MG tablet Take 81 mg by mouth daily.      . carvedilol (COREG) 6.25 MG tablet Take 1 tablet (6.25 mg total) by mouth 2 (two) times daily with a meal.  180 tablet  3  . Cholecalciferol (VITAMIN D) 2000 UNITS CAPS Take 1 capsule by mouth daily.       . colchicine 0.6 MG tablet Take one twice daily for 3 days, then once daily for possible gout  20 tablet  0  . furosemide (LASIX) 40 MG tablet Take 1 tablet (40 mg total) by mouth 2 (two) times daily.  180 tablet  3    . hydrALAZINE (APRESOLINE) 50 MG tablet Take 1 tablet (50 mg total) by mouth 3 (three) times daily.  270 tablet  3  . lisinopril-hydrochlorothiazide (PRINZIDE,ZESTORETIC) 20-25 MG per tablet Take 1 tablet by mouth daily.      . Multiple Vitamins-Minerals (MULTIVITAMIN WITH MINERALS) tablet Take 1 tablet by mouth daily.      . traMADol (ULTRAM) 50 MG tablet Take 1 tablet (50 mg total) by mouth every 8 (eight) hours as needed for pain.  20 tablet  0   No current facility-administered medications on file prior to visit.   Allergies  Allergen Reactions  . Asa [Aspirin] Other (See Comments)    GI Upset     Review of Systems  Constitutional: Negative for fever, chills, diaphoresis, activity change, appetite change and unexpected weight change.  Cardiovascular: Positive for leg swelling.  Musculoskeletal: Positive for arthralgias, joint swelling and myalgias. Negative for gait problem.  Skin: Positive for color change, rash and wound. Negative for pallor.  Neurological: Positive for weakness. Negative for numbness.  Hematological: Negative for adenopathy. Bruises/bleeds easily.  Psychiatric/Behavioral: Positive for sleep disturbance.       Objective:  BP  118/62  Pulse 66  Temp(Src) 97.7 F (36.5 C)  Resp 16  SpO2 99%  Physical Exam  Constitutional: She is oriented to person, place, and time. She appears well-developed and well-nourished. No distress.  In wheelchair  HENT:  Head: Normocephalic and atraumatic.  Right Ear: External ear normal.  Eyes: Conjunctivae are normal. No scleral icterus.  Cardiovascular:  Pulses:      Dorsalis pedis pulses are 2+ on the right side, and 2+ on the left side.       Posterior tibial pulses are 0 on the right side, and 0 on the left side.  Pulmonary/Chest: Effort normal.  Musculoskeletal:       Left ankle: She exhibits decreased range of motion, swelling and ecchymosis. She exhibits normal pulse. No tenderness. No lateral malleolus and no  medial malleolus tenderness found.       Left foot: She exhibits decreased range of motion, swelling and decreased capillary refill. She exhibits no tenderness and no bony tenderness.  Neurological: She is alert and oriented to person, place, and time.  Skin: Skin is warm and dry. Rash noted. She is not diaphoretic. There is erythema.  Bilateral legs w/ 1+ pitting edema.  Skin on bilateral legs thickend and scaly Left lower leg and ankle with bullae, thickened, scaling, cracking skin with underlying erythema. Medial and lateral malleolus w/ superficial ulceration and serous drainage.  Psychiatric: She has a normal mood and affect. Her behavior is normal.      [facility] reading (PRIMARY) by  Dr. Clelia Croft.   Left foot: arthritis, no signs of osteomyelitis or tophi. No acute abnormality. EXAM: LEFT FOOT - COMPLETE 3+ VIEW  COMPARISON: None.  FINDINGS: No evidence of acute fracture in the midfoot or forefoot. There is joint space narrowing at the tarsal metatarsal joints. There is spurring of the plantar aspect of the calcaneus. Osteopenia present.  IMPRESSION: 1. No acute findings of the foot. 2. Osteopenia and mild arthropathy.  Assessment & Plan:   Cellulitis of left foot - Plan: DG Foot Complete Left, CANCELED: POCT CBC, CANCELED: POCT glucose (manual entry), CANCELED: POCT SEDIMENTATION RATE, CANCELED: Uric acid, CANCELED: Comprehensive metabolic panel, CANCELED: C-reactive protein  Foot swelling - Plan: DG Foot Complete Left, Ambulatory referral to Podiatry, CANCELED: POCT CBC, CANCELED: POCT glucose (manual entry), CANCELED: POCT SEDIMENTATION RATE, CANCELED: Uric acid, CANCELED: Comprehensive metabolic panel, CANCELED: C-reactive protein  Gout of left foot, unspecified cause, unspecified chronicity - Plan: DG Foot Complete Left, CANCELED: POCT CBC, CANCELED: POCT glucose (manual entry), CANCELED: POCT SEDIMENTATION RATE, CANCELED: Uric acid, CANCELED: Comprehensive metabolic panel,  CANCELED: C-reactive protein  Onychomycosis  Renal failure, chronic, stage 3 (moderate)  Unable to draw labs after 2 different phlebotomists tried 4 times so labs cancelled. Poss this could be gout but I think it is cellulitis so will treat with doxy and recheck in 48 hrs - sooner if worsening.  Pt clearly has some chronic foot/nail problems w/ severe onychomycosis and foot care so refer to podiatry.  I suspect pt has some vasular disease causing wasting of her lower leg muscles and thickening of her skin but I'm not sure there is much that could or should be done about it due to her age- however, could make cellulitis more difficult to treat so will want to monitor closely. Rec pt f/u w/ her PCP Dr. Allyne Gee ASAP.  Meds ordered this encounter  Medications  . doxycycline (VIBRAMYCIN) 100 MG capsule    Sig: Take 1 capsule (100 mg total) by  mouth 2 (two) times daily.    Dispense:  20 capsule    Refill:  0  . acetaminophen-codeine (TYLENOL #3) 300-30 MG per tablet    Sig: Take 1 tablet by mouth every 4 (four) hours as needed for moderate pain.    Dispense:  20 tablet    Refill:  0  . nystatin (MYCOSTATIN/NYSTOP) 100000 UNIT/GM POWD    Sig: Apply 1 g topically 3 (three) times daily.    Dispense:  60 g    Refill:  0     Norberto Sorenson, MD MPH  Over 40 min spent in face-to-face evaluation of and consultation with patient and coordination of care.

## 2013-11-16 ENCOUNTER — Ambulatory Visit (INDEPENDENT_AMBULATORY_CARE_PROVIDER_SITE_OTHER): Payer: Medicare Other | Admitting: Family Medicine

## 2013-11-16 VITALS — BP 170/48 | HR 60 | Temp 97.4°F | Resp 16 | Ht 66.75 in | Wt 168.2 lb

## 2013-11-16 DIAGNOSIS — L02619 Cutaneous abscess of unspecified foot: Secondary | ICD-10-CM

## 2013-11-16 DIAGNOSIS — L03119 Cellulitis of unspecified part of limb: Secondary | ICD-10-CM

## 2013-11-16 DIAGNOSIS — L97909 Non-pressure chronic ulcer of unspecified part of unspecified lower leg with unspecified severity: Secondary | ICD-10-CM

## 2013-11-16 DIAGNOSIS — I83009 Varicose veins of unspecified lower extremity with ulcer of unspecified site: Secondary | ICD-10-CM

## 2013-11-16 DIAGNOSIS — L03116 Cellulitis of left lower limb: Secondary | ICD-10-CM

## 2013-11-16 DIAGNOSIS — M7989 Other specified soft tissue disorders: Secondary | ICD-10-CM

## 2013-11-16 DIAGNOSIS — IMO0001 Reserved for inherently not codable concepts without codable children: Secondary | ICD-10-CM

## 2013-11-16 LAB — POCT CBC
GRANULOCYTE PERCENT: 43.7 % (ref 37–80)
HEMATOCRIT: 30.5 % — AB (ref 37.7–47.9)
HEMOGLOBIN: 9.5 g/dL — AB (ref 12.2–16.2)
Lymph, poc: 1.8 (ref 0.6–3.4)
MCH, POC: 32 pg — AB (ref 27–31.2)
MCHC: 31.3 g/dL — AB (ref 31.8–35.4)
MCV: 102.4 fL — AB (ref 80–97)
MID (cbc): 0.8 (ref 0–0.9)
MPV: 9.1 fL (ref 0–99.8)
POC Granulocyte: 2 (ref 2–6.9)
POC LYMPH %: 38.2 % (ref 10–50)
POC MID %: 18.1 %M — AB (ref 0–12)
Platelet Count, POC: 109 10*3/uL — AB (ref 142–424)
RBC: 2.98 M/uL — AB (ref 4.04–5.48)
RDW, POC: 17.9 %
WBC: 4.6 10*3/uL (ref 4.6–10.2)

## 2013-11-16 NOTE — Progress Notes (Signed)
Subjective:    Patient ID: Meghan Russo, female    DOB: November 27, 1920, 80 y.o.   MRN: [id number]  This chart was scribed for Dr. Norberto Sorenson, MD by Jarvis Morgan, ED Scribe. This patient was seen in Room 9 and the patient's care was started at 6:03 PM.  Chief Complaint  Patient presents with  . Cellulitis    follow up    HPI HPI Comments: Meghan Russo is a 80 y.o. female with a h/o HTN, edema, stage III chronic kidney disease, who presents to the [facility] for a recheck from her appt on 11/14/13. Saw pt on 11/14/13 had been having progressive left foot and ankle pain, swelling and erythema for 3 weeks. Had some breakdowns over lateral and medial malleolus. Has h/o gout due to stage III renal failure but suspected this was more cellulitic rather than gout unfortunately we could not obtain any blood for testing. Left foot x-ray was unremarkable other than mild arthralpa.  Therefore pt placed on doxycycline with some topical nystatin cream between toes where her skin was becoming macerated and Tylenol #3 for pain. Referred pt to podiatry and recommend she follow up with PCP as soon as possible. Patient states that the pain is gradually improving and she has been taking her medication. She also reports that the swelling is mildly improving but that there is still some swelling present. She states that her symptoms are the same as before. She denies any fever or chills.   Past Medical History  Diagnosis Date  . Hypertension   . Knee pain   . Edema   . Chronic kidney disease, stage III (moderate)   . Benign hypertensive kidney disease with chronic kidney disease stage I through stage IV, or unspecified   . Moderate aortic stenosis     moderate MR, severe TR  . Moderate to severe pulmonary hypertension   . Arthritis   . Cataract    Current Outpatient Prescriptions on File Prior to Visit  Medication Sig Dispense Refill  . acetaminophen-codeine (TYLENOL #3) 300-30 MG per  tablet Take 1 tablet by mouth every 4 (four) hours as needed for moderate pain.  20 tablet  0  . aspirin 81 MG tablet Take 81 mg by mouth daily.      . carvedilol (COREG) 6.25 MG tablet Take 1 tablet (6.25 mg total) by mouth 2 (two) times daily with a meal.  180 tablet  3  . Cholecalciferol (VITAMIN D) 2000 UNITS CAPS Take 1 capsule by mouth daily.       Marland Kitchen doxycycline (VIBRAMYCIN) 100 MG capsule Take 1 capsule (100 mg total) by mouth 2 (two) times daily.  20 capsule  0  . furosemide (LASIX) 40 MG tablet Take 1 tablet (40 mg total) by mouth 2 (two) times daily.  180 tablet  3  . hydrALAZINE (APRESOLINE) 50 MG tablet Take 1 tablet (50 mg total) by mouth 3 (three) times daily.  270 tablet  3  . lisinopril-hydrochlorothiazide (PRINZIDE,ZESTORETIC) 20-25 MG per tablet Take 1 tablet by mouth daily.      . Multiple Vitamins-Minerals (MULTIVITAMIN WITH MINERALS) tablet Take 1 tablet by mouth daily.      Marland Kitchen nystatin (MYCOSTATIN/NYSTOP) 100000 UNIT/GM POWD Apply 1 g topically 3 (three) times daily.  60 g  0   No current facility-administered medications on file prior to visit.   Allergies  Allergen Reactions  . Asa [Aspirin] Other (See Comments)    GI Upset  Review of Systems  Constitutional: Negative for fever and chills.  Cardiovascular: Positive for leg swelling.  Musculoskeletal: Positive for arthralgias, joint swelling and myalgias.  Skin: Positive for color change, rash and wound.   BP 170/48  Pulse 60  Temp(Src) 97.4 F (36.3 C) (Oral)  Resp 16  Ht 5' 6.75" (1.695 m)  Wt 168 lb 3.2 oz (76.295 kg)  BMI 26.56 kg/m2  SpO2 97%     Objective:   Physical Exam  Nursing note and vitals reviewed. Constitutional: She is oriented to person, place, and time. She appears well-developed and well-nourished. No distress.  HENT:  Head: Normocephalic and atraumatic.  Eyes: Conjunctivae and EOM are normal.  Neck: Neck supple. No tracheal deviation present.  Cardiovascular: Normal rate.     Pulmonary/Chest: Effort normal. No respiratory distress.  Musculoskeletal: Normal range of motion. She exhibits edema (1+ pitting edema of both legs).  Neurological: She is alert and oriented to person, place, and time.  Skin: Skin is warm and dry. There is erythema.  Small around of serous drainage from left foot. Erythema around ankle decreased but now present and extending up calf. Skin is very thickened with bilateral scaling, cracking with boullae over distal toes. Mild skin ulceration and breakdown of both lateral medial malleolus with necrotic exudate.   Psychiatric: She has a normal mood and affect. Her behavior is normal.        Assessment & Plan:  I still suspect this is cellulitis that is worsening with her poor vascular status, I'm unsure if oral antiobiotics will fix her symptoms but continue doxy, will try to get her into wound care specialist as soon as possible and needs to see Podiatrist as soon as possible.  Cellulitis of left foot - Plan: AMB referral to wound care center, POCT CBC, CANCELED: POCT SEDIMENTATION RATE, CANCELED: Comprehensive metabolic panel  Foot swelling - Plan: AMB referral to wound care center, POCT CBC, CANCELED: POCT SEDIMENTATION RATE, CANCELED: Comprehensive metabolic panel Unna boot removed with significant improvement in cellulitis and venous stasis ulcers.  I personally performed the services described in this documentation, which was scribed in my presence. The recorded information has been reviewed and considered, and addended by me as needed.  Norberto SorensonEva Zakirah Weingart, MD MPH   Results for orders placed in visit on 11/16/13  POCT CBC      Result Value Ref Range   WBC 4.6  4.6 - 10.2 K/uL   Lymph, poc 1.8  0.6 - 3.4   POC LYMPH PERCENT 38.2  10 - 50 %L   MID (cbc) 0.8  0 - 0.9   POC MID % 18.1 (*) 0 - 12 %M   POC Granulocyte 2.0  2 - 6.9   Granulocyte percent 43.7  37 - 80 %G   RBC 2.98 (*) 4.04 - 5.48 M/uL   Hemoglobin 9.5 (*) 12.2 - 16.2 g/dL    HCT, POC 16.130.5 (*) 09.637.7 - 47.9 %   MCV 102.4 (*) 80 - 97 fL   MCH, POC 32.0 (*) 27 - 31.2 pg   MCHC 31.3 (*) 31.8 - 35.4 g/dL   RDW, POC 04.517.9     Platelet Count, POC 109 (*) 142 - 424 K/uL   MPV 9.1  0 - 99.8 fL

## 2013-11-16 NOTE — Progress Notes (Signed)
Unna boot applied.  ?

## 2013-11-19 ENCOUNTER — Ambulatory Visit (INDEPENDENT_AMBULATORY_CARE_PROVIDER_SITE_OTHER): Payer: Medicare Other | Admitting: Family Medicine

## 2013-11-19 VITALS — BP 158/48 | HR 55 | Temp 97.7°F | Resp 16 | Ht 66.0 in | Wt 163.0 lb

## 2013-11-19 DIAGNOSIS — L97321 Non-pressure chronic ulcer of left ankle limited to breakdown of skin: Secondary | ICD-10-CM

## 2013-11-19 DIAGNOSIS — L03119 Cellulitis of unspecified part of limb: Secondary | ICD-10-CM

## 2013-11-19 DIAGNOSIS — R5381 Other malaise: Secondary | ICD-10-CM

## 2013-11-19 DIAGNOSIS — L97309 Non-pressure chronic ulcer of unspecified ankle with unspecified severity: Secondary | ICD-10-CM

## 2013-11-19 NOTE — Patient Instructions (Signed)
Your unna boot was changed today but you leg is MUCH improved from when we first put it on 8/17.  Hopefully, the podiatrist can put one back on when it is removed tomorrow to evaluate your foot. If not, call us and we will have you go pick one up at the medical supply store and bring it to our office for application.   I have put in an order for [facility] to come out to your house and do a safety evaluation and change your unna boot while monitoring your wound twice a week - hopefully they will come out on Monday or Tuesday to change your unna boot. I am hoping that your leg should be totally healed and you can cancel the wound care appointment scheduled for 9/3 and stop wearing the unna boot and go back to normal life when healed - hopefully within 2 weeks. Finish off the 10d course of doxycycline - you should be 5d through this.  Stasis Ulcer Stasis ulcers occur in the legs when the circulation is damaged. An ulcer may look like a small hole in the skin.  CAUSES Stasis ulcers occur because your veins do not work properly. Veins have valves that help the blood return to the heart. If these valves do not work right, blood flows backwards and backs up into the veins near the skin. This condition causes the veins to become larger because of increased pressure and may lead to a stasis ulcer. SYMPTOMS   Shallow (superficial) sore on the leg.  Clear drainage or weeping from the sore.  Leg pain or a feeling of heaviness. This may be worse at the end of the day.  Leg swelling.  Skin color changes. DIAGNOSIS  Your caregiver will make a diagnosis by examining your leg. Your caregiver may order tests such as an ultrasound or other studies to evaluate the blood flow of the leg. HOME CARE INSTRUCTIONS   Do not stand or sit in one position for long periods of time. Do not sit with your legs crossed. Rest with your legs raised during the day. If possible, it is best if you can elevate your legs  above your heart for 30 minutes, 3 to 4 times a day.  Wear elastic stockings or support hose. Do not wear other tight encircling garments around legs, pelvis, or waist. This causes increased pressure in your veins. If your caregiver has applied compressive medicated wraps, use them as instructed.  Walk as much as possible to increase blood flow. If you are taking long rides in a car or plane, take a break to walk around every 2 hours. If not already on aspirin, take a baby aspirin before long trips unless you have medical reasons that prohibit this.  Raise the foot of your bed at night with 2-inch blocks if approved by your caregiver. This may not be desirable if you have heart failure or breathing problems.  If you get a cut in the skin over the vein and the vein bleeds, lie down with your leg raised and gently clean the area with a clean cloth. Apply pressure on the cut until the bleeding stops. Then place a dressing on the cut. See your caregiver if it continues to bleed or needs stitches. Also, see your caregiver if you develop an infection.Signs of an infection include a fever, redness, increased pain, and drainage of pus.  If your caregiver has given you a follow-up appointment, it is very important to keep that  appointment. Not keeping the appointment could result in a chronic or permanent injury, pain, and disability. If there is any problem keeping the appointment, call your caregiver for assistance. SEEK IMMEDIATE MEDICAL CARE IF:  The ulcer area starts to break down.  You have pain, redness, tenderness, pus, or hard swelling in your leg over a vein or near the ulcer.  Your leg pain is uncomfortable.  You develop an unexplained fever.  You develop chest pain or shortness of breath. Document Released: 12/12/2000 Document Revised: 06/11/2011 Document Reviewed: 07/09/2010 [facility] Patient Information 2015 [place], [place]. This information is not intended to replace advice given to you  by your health care provider. Make sure you discuss any questions you have with your health care provider.

## 2013-11-20 ENCOUNTER — Encounter: Payer: Self-pay | Admitting: Podiatrist

## 2013-11-20 ENCOUNTER — Ambulatory Visit (INDEPENDENT_AMBULATORY_CARE_PROVIDER_SITE_OTHER): Payer: Medicare Other | Admitting: Podiatrist

## 2013-11-20 VITALS — BP 130/58 | HR 60 | Resp 18

## 2013-11-20 DIAGNOSIS — B351 Tinea unguium: Secondary | ICD-10-CM

## 2013-11-20 DIAGNOSIS — L97329 Non-pressure chronic ulcer of left ankle with unspecified severity: Secondary | ICD-10-CM

## 2013-11-20 DIAGNOSIS — I83023 Varicose veins of left lower extremity with ulcer of ankle: Secondary | ICD-10-CM

## 2013-11-20 DIAGNOSIS — L97909 Non-pressure chronic ulcer of unspecified part of unspecified lower leg with unspecified severity: Secondary | ICD-10-CM

## 2013-11-20 DIAGNOSIS — M79676 Pain in unspecified toe(s): Principal | ICD-10-CM

## 2013-11-20 DIAGNOSIS — I83009 Varicose veins of unspecified lower extremity with ulcer of unspecified site: Secondary | ICD-10-CM

## 2013-11-20 DIAGNOSIS — R609 Edema, unspecified: Secondary | ICD-10-CM

## 2013-11-20 DIAGNOSIS — M79609 Pain in unspecified limb: Secondary | ICD-10-CM

## 2013-11-20 NOTE — Progress Notes (Signed)
   Subjective:    Patient ID: Meghan Russo, female    DOB: 08/04/1920, 80 y.o.   MRN: [id number]  HPI my left foot has been going on for about 4 weeks and is sore and tender and i didn't injury it and has some tingling and does burn and throb and hurts to wear shoes and Dr Clelia CroftShaw referred us over here and i had an x-ray last week  Patient presents today with her daughter for left foot swelling, and ulceration, and for elongated uncomfortable toenails. She states that Dr. Clelia CroftShaw has applied an Foot LockerUnna boot and it has helped significantly with the swelling. Also looking back at notes it appears she has a appointment with wound care in early September. Her Unna boot is intact in place at today's visit and we did remove it for the evaluation.  Review of Systems     Objective:   Physical Exam GENERAL APPEARANCE: Alert, conversant. Appropriately groomed. No acute distress.  VASCULAR: Pedal pulses palpable at 2/4 DP and 0/4 PT bilateral.  Capillary refill time is immediate to all digits,  Proximal to distal cooling it warm to warm.   NEUROLOGIC: sensation is intact epicritically and protectively to 5.07 monofilament at 5/5 sites bilateral.  Light touch is intact bilateral, vibratory sensation intact bilateral. MUSCULOSKELETAL: Decreased muscle, strength, tone and is present which is consistent with the patient's age and ambulation status. Slight contracture deformity of lesser digits is present but mild. DERMATOLOGIC: Stasis dermatitis skin changes are present on the left foot, ankle and lower leg. A very superficial ulceration on the lateral aspect of the left ankle with a fibro granular base is present measuring 3 mm in diameter and equal with the surrounding skin surface. It appears stable. No redness, no streaking, no pus, no purulence, no signs of infection are present. Swelling is mild at today's visit and does appear to be significantly improved from her last office visit with Dr. Clelia CroftShaw. The right foot is  mildly swollen however there is no skin breakdown present. Patient's toenails bilateral are thickened, discolored, dystrophic and clinically mycotic. There uncomfortable with debridement.    Assessment & Plan:  Stasis dermatitis, edema, superficial ulceration left ankle, symptomatic mycotic toenails  Plan: Toenails were debrided today without complication both manually and mechanically. Another Unna boot was applied and it appears home health will be visiting her on Monday or Tuesday for an application of another Foot LockerUnna boot. She states she is going to see Dr. Clelia CroftShaw to decide if she will need wound care. She will be seen back by me as needed.  At this point the Unna boot therapy appears to be healing the wound and keeping the swelling down well. If any problems or concerns arise in the future she is instructed to call me immediately.

## 2013-11-20 NOTE — Patient Instructions (Signed)
Stasis Ulcer Stasis ulcers occur in the legs when the circulation is damaged. An ulcer may look like a small hole in the skin.  CAUSES Stasis ulcers occur because your veins do not work properly. Veins have valves that help the blood return to the heart. If these valves do not work right, blood flows backwards and backs up into the veins near the skin. This condition causes the veins to become larger because of increased pressure and may lead to a stasis ulcer. SYMPTOMS   Shallow (superficial) sore on the leg.  Clear drainage or weeping from the sore.  Leg pain or a feeling of heaviness. This may be worse at the end of the day.  Leg swelling.  Skin color changes. DIAGNOSIS  Your caregiver will make a diagnosis by examining your leg. Your caregiver may order tests such as an ultrasound or other studies to evaluate the blood flow of the leg. HOME CARE INSTRUCTIONS   Do not stand or sit in one position for long periods of time. Do not sit with your legs crossed. Rest with your legs raised during the day. If possible, it is best if you can elevate your legs above your heart for 30 minutes, 3 to 4 times a day.  Wear elastic stockings or support hose. Do not wear other tight encircling garments around legs, pelvis, or waist. This causes increased pressure in your veins. If your caregiver has applied compressive medicated wraps, use them as instructed.  Walk as much as possible to increase blood flow. If you are taking long rides in a car or plane, take a break to walk around every 2 hours. If not already on aspirin, take a baby aspirin before long trips unless you have medical reasons that prohibit this.  Raise the foot of your bed at night with 2-inch blocks if approved by your caregiver. This may not be desirable if you have heart failure or breathing problems.  If you get a cut in the skin over the vein and the vein bleeds, lie down with your leg raised and gently clean the area with a clean  cloth. Apply pressure on the cut until the bleeding stops. Then place a dressing on the cut. See your caregiver if it continues to bleed or needs stitches. Also, see your caregiver if you develop an infection.Signs of an infection include a fever, redness, increased pain, and drainage of pus.  If your caregiver has given you a follow-up appointment, it is very important to keep that appointment. Not keeping the appointment could result in a chronic or permanent injury, pain, and disability. If there is any problem keeping the appointment, call your caregiver for assistance. SEEK IMMEDIATE MEDICAL CARE IF:  The ulcer area starts to break down.  You have pain, redness, tenderness, pus, or hard swelling in your leg over a vein or near the ulcer.  Your leg pain is uncomfortable.  You develop an unexplained fever.  You develop chest pain or shortness of breath. Document Released: 12/12/2000 Document Revised: 06/11/2011 Document Reviewed: 07/09/2010 [employer] Patient Information 2015 [employer]. This information is not intended to replace advice given to you by your health care provider. Make sure you discuss any questions you have with your health care provider.  

## 2013-11-24 ENCOUNTER — Telehealth: Payer: Self-pay

## 2013-11-24 NOTE — Telephone Encounter (Signed)
Pt was referred to [place]- gave pt contact information for

## 2013-11-24 NOTE — Telephone Encounter (Signed)
Daughter called to see when the home health care nurse was coming out to change the dressing on her mothers wound.   [phone]

## 2013-11-27 ENCOUNTER — Telehealth: Payer: Self-pay

## 2013-11-27 NOTE — Telephone Encounter (Signed)
Spoke to [place]- She is scheduled to see pt twice a week for wound care. Pt was evaluated for home safety and the evaluation revieled pt should be evaluated by PT/OT for physical therapy for fall prevention. Victorino Dike is going to fax over the orders for this for Dr. Clelia Croft to sign off on.

## 2013-11-27 NOTE — Telephone Encounter (Signed)
Great, will be happy to authorize these orders thanks. es

## 2013-11-27 NOTE — Telephone Encounter (Signed)
Dr Clelia Croft had ordered home health care for this pt and the RN Albertina Parr is calling to give report and to get orders for the pt. She can be reached at [phone]. Thank you

## 2013-11-30 NOTE — Progress Notes (Signed)
Subjective:    Patient ID: Meghan Russo, female    DOB: Aug 30, 1920, 80 y.o.   MRN: [id number] Chief Complaint  Patient presents with  . Follow-up    left foot     HPI Past Medical History  Diagnosis Date  . Hypertension   . Knee pain   . Edema   . Chronic kidney disease, stage III (moderate)   . Benign hypertensive kidney disease with chronic kidney disease stage I through stage IV, or unspecified   . Moderate aortic stenosis     moderate MR, severe TR  . Moderate to severe pulmonary hypertension   . Arthritis   . Cataract    Current Outpatient Prescriptions on File Prior to Visit  Medication Sig Dispense Refill  . acetaminophen-codeine (TYLENOL #3) 300-30 MG per tablet Take 1 tablet by mouth every 4 (four) hours as needed for moderate pain.  20 tablet  0  . aspirin 81 MG tablet Take 81 mg by mouth daily.      . carvedilol (COREG) 6.25 MG tablet Take 1 tablet (6.25 mg total) by mouth 2 (two) times daily with a meal.  180 tablet  3  . Cholecalciferol (VITAMIN D) 2000 UNITS CAPS Take 1 capsule by mouth daily.       Marland Kitchen doxycycline (VIBRAMYCIN) 100 MG capsule Take 1 capsule (100 mg total) by mouth 2 (two) times daily.  20 capsule  0  . furosemide (LASIX) 40 MG tablet Take 1 tablet (40 mg total) by mouth 2 (two) times daily.  180 tablet  3  . hydrALAZINE (APRESOLINE) 50 MG tablet Take 1 tablet (50 mg total) by mouth 3 (three) times daily.  270 tablet  3  . lisinopril-hydrochlorothiazide (PRINZIDE,ZESTORETIC) 20-25 MG per tablet Take 1 tablet by mouth daily.      . Multiple Vitamins-Minerals (MULTIVITAMIN WITH MINERALS) tablet Take 1 tablet by mouth daily.      Marland Kitchen nystatin (MYCOSTATIN/NYSTOP) 100000 UNIT/GM POWD Apply 1 g topically 3 (three) times daily.  60 g  0   No current facility-administered medications on file prior to visit.   Allergies  Allergen Reactions  . Asa [Aspirin] Other (See Comments)    GI Upset      Review of Systems  Constitutional: Positive for activity  change and fatigue. Negative for fever, chills and diaphoresis.  Cardiovascular: Positive for leg swelling.  Musculoskeletal: Positive for arthralgias, gait problem, joint swelling and myalgias.  Skin: Positive for color change and rash. Negative for pallor and wound.  Hematological: Negative for adenopathy. Does not bruise/bleed easily.       Objective:  BP 158/48  Pulse 55  Temp(Src) 97.7 F (36.5 C) (Oral)  Resp 16  Ht  (1.676 m)  Wt 163 lb (73.936 kg)  BMI 26.32 kg/m2  SpO2 96%  Physical Exam  Constitutional: She is oriented to person, place, and time. She appears well-developed. She appears cachectic. No distress.  In wheelchair w/ daughter pushing  HENT:  Head: Normocephalic and atraumatic.  Right Ear: External ear normal.  Eyes: Conjunctivae are normal. No scleral icterus.  Pulmonary/Chest: Effort normal.  Neurological: She is alert and oriented to person, place, and time.  Skin: Skin is warm and dry. Rash noted. She is not diaphoretic. No erythema.  Multiple shallow venous stasis ulcerations left leg  Psychiatric: She has a normal mood and affect. Her behavior is normal.          Assessment & Plan:  Ankle ulcer, left, limited to  breakdown of skin - Plan: Ambulatory referral to Home Health - unna boot applied again as healing well w/ it. Will ask HH to come to pt's house for wound care twice a week w/ unna bot application till healed. Has initial c/s w/ wound center in a month but will hopefully cancel as hopefully wound will be healed by then. Continue antibiotic. Cellulitis improving.  Ankle cellulitis - Plan: Ambulatory referral to Home Health  Debility   Norberto Sorenson, MD MPH

## 2013-12-01 NOTE — Telephone Encounter (Signed)
Yep, sounds good

## 2013-12-01 NOTE — Telephone Encounter (Signed)
lmom for Meghan Russo to cb

## 2013-12-01 NOTE — Telephone Encounter (Signed)
Please advise 

## 2013-12-01 NOTE — Telephone Encounter (Signed)
Meghan Russo called to say the nurse states the leg wound is healing and does not think the patient needs to go to the wound center for treatment.  They want to know if it is okay for the nurse to continue to come and treat patient in the home.   [phone]

## 2013-12-02 NOTE — Telephone Encounter (Signed)
Patients daughter Britta Mccreedy return call to nurse. I let her know Dr. Clelia Croft had ok for the Home health nurse to come out to treat patient at home for her wound care. Barbara asked for the Wound Center's phone number to call to confirm the nurse would still be coming out to their home. Barbara's call back number is [phone] if needed

## 2013-12-03 ENCOUNTER — Encounter (HOSPITAL_BASED_OUTPATIENT_CLINIC_OR_DEPARTMENT_OTHER): Payer: Medicare Other | Attending: Internal Medicine

## 2013-12-17 ENCOUNTER — Ambulatory Visit (INDEPENDENT_AMBULATORY_CARE_PROVIDER_SITE_OTHER): Payer: Medicare Other | Admitting: Family Medicine

## 2013-12-17 VITALS — BP 150/44 | HR 53 | Temp 97.7°F | Resp 16 | Ht 66.0 in | Wt 172.0 lb

## 2013-12-17 DIAGNOSIS — B351 Tinea unguium: Secondary | ICD-10-CM

## 2013-12-17 DIAGNOSIS — L97909 Non-pressure chronic ulcer of unspecified part of unspecified lower leg with unspecified severity: Secondary | ICD-10-CM

## 2013-12-17 DIAGNOSIS — B353 Tinea pedis: Secondary | ICD-10-CM

## 2013-12-17 DIAGNOSIS — L97329 Non-pressure chronic ulcer of left ankle with unspecified severity: Secondary | ICD-10-CM

## 2013-12-17 DIAGNOSIS — I83023 Varicose veins of left lower extremity with ulcer of ankle: Secondary | ICD-10-CM

## 2013-12-17 DIAGNOSIS — I83009 Varicose veins of unspecified lower extremity with ulcer of unspecified site: Secondary | ICD-10-CM

## 2013-12-17 DIAGNOSIS — I872 Venous insufficiency (chronic) (peripheral): Secondary | ICD-10-CM

## 2013-12-17 DIAGNOSIS — I8310 Varicose veins of unspecified lower extremity with inflammation: Secondary | ICD-10-CM

## 2013-12-17 MED ORDER — NYSTATIN 100000 UNIT/GM EX POWD: CUTANEOUS | Status: DC

## 2013-12-17 NOTE — Patient Instructions (Signed)
Stasis Ulcer Stasis ulcers occur in the legs when the circulation is damaged. An ulcer may look like a small hole in the skin.  CAUSES Stasis ulcers occur because your veins do not work properly. Veins have valves that help the blood return to the heart. If these valves do not work right, blood flows backwards and backs up into the veins near the skin. This condition causes the veins to become larger because of increased pressure and may lead to a stasis ulcer. SYMPTOMS   Shallow (superficial) sore on the leg.  Clear drainage or weeping from the sore.  Leg pain or a feeling of heaviness. This may be worse at the end of the day.  Leg swelling.  Skin color changes. DIAGNOSIS  Your caregiver will make a diagnosis by examining your leg. Your caregiver may order tests such as an ultrasound or other studies to evaluate the blood flow of the leg. HOME CARE INSTRUCTIONS   Do not stand or sit in one position for long periods of time. Do not sit with your legs crossed. Rest with your legs raised during the day. If possible, it is best if you can elevate your legs above your heart for 30 minutes, 3 to 4 times a day.  Wear elastic stockings or support hose. Do not wear other tight encircling garments around legs, pelvis, or waist. This causes increased pressure in your veins. If your caregiver has applied compressive medicated wraps, use them as instructed.  Walk as much as possible to increase blood flow. If you are taking long rides in a car or plane, take a break to walk around every 2 hours. If not already on aspirin, take a baby aspirin before long trips unless you have medical reasons that prohibit this.  Raise the foot of your bed at night with 2-inch blocks if approved by your caregiver. This may not be desirable if you have heart failure or breathing problems.  If you get a cut in the skin over the vein and the vein bleeds, lie down with your leg raised and gently clean the area with a clean  cloth. Apply pressure on the cut until the bleeding stops. Then place a dressing on the cut. See your caregiver if it continues to bleed or needs stitches. Also, see your caregiver if you develop an infection.Signs of an infection include a fever, redness, increased pain, and drainage of pus.  If your caregiver has given you a follow-up appointment, it is very important to keep that appointment. Not keeping the appointment could result in a chronic or permanent injury, pain, and disability. If there is any problem keeping the appointment, call your caregiver for assistance. SEEK IMMEDIATE MEDICAL CARE IF:  The ulcer area starts to break down.  You have pain, redness, tenderness, pus, or hard swelling in your leg over a vein or near the ulcer.  Your leg pain is uncomfortable.  You develop an unexplained fever.  You develop chest pain or shortness of breath. Document Released: 12/12/2000 Document Revised: 06/11/2011 Document Reviewed: 07/09/2010 [facility] [place] Patient Information 2015 [place], [place]. This information is not intended to replace advice given to you by your health care provider. Make sure you discuss any questions you have with your health care provider.   Venous Stasis or Chronic Venous Insufficiency Chronic venous insufficiency, also called venous stasis, is a condition that affects the veins in the legs. The condition prevents blood from being pumped through these veins effectively. Blood may no longer be pumped  effectively from the legs back to the heart. This condition can range from mild to severe. With proper treatment, you should be able to continue with an active life. CAUSES  Chronic venous insufficiency occurs when the vein walls become stretched, weakened, or damaged or when valves within the vein are damaged. Some common causes of this include:  High blood pressure inside the veins (venous hypertension).  Increased blood pressure in the leg veins from long periods of  sitting or standing.  A blood clot that blocks blood flow in a vein (deep vein thrombosis).  Inflammation of a superficial vein (phlebitis) that causes a blood clot to form. RISK FACTORS Various things can make you more likely to develop chronic venous insufficiency, including:  Family history of this condition.  Obesity.  Pregnancy.  Sedentary lifestyle.  Smoking.  Jobs requiring long periods of standing or sitting in one place.  Being a certain age. Women in their 49s and 56s and men in their 3s are more likely to develop this condition. SIGNS AND SYMPTOMS  Symptoms may include:   Varicose veins.  Skin breakdown or ulcers.  Reddened or discolored skin on the leg.  Brown, smooth, tight, and painful skin just above the ankle, usually on the inside surface (lipodermatosclerosis).  Swelling. DIAGNOSIS  To diagnose this condition, your health care provider will take a medical history and do a physical exam. The following tests may be ordered to confirm the diagnosis:  Duplex ultrasound--A procedure that produces a picture of a blood vessel and nearby organs and also provides information on blood flow through the blood vessel.  Plethysmography--A procedure that tests blood flow.  A venogram, or venography--A procedure used to look at the veins using X-ray and dye. TREATMENT The goals of treatment are to help you return to an active life and to minimize pain or disability. Treatment will depend on the severity of the condition. Medical procedures may be needed for severe cases. Treatment options may include:   Use of compression stockings. These can help with symptoms and lower the chances of the problem getting worse, but they do not cure the problem.  Sclerotherapy--A procedure involving an injection of a material that "dissolves" the damaged veins. Other veins in the network of blood vessels take over the function of the damaged veins.  Surgery to remove the vein or cut  off blood flow through the vein (vein stripping or laser ablation surgery).  Surgery to repair a valve. HOME CARE INSTRUCTIONS   Wear compression stockings as directed by your health care provider.  Only take over-the-counter or prescription medicines for pain, discomfort, or fever as directed by your health care provider.  Follow up with your health care provider as directed. SEEK MEDICAL CARE IF:   You have redness, swelling, or increasing pain in the affected area.  You see a red streak or line that extends up or down from the affected area.  You have a breakdown or loss of skin in the affected area, even if the breakdown is small.  You have an injury to the affected area. SEEK IMMEDIATE MEDICAL CARE IF:   You have an injury and open wound in the affected area.  Your pain is severe and does not improve with medicine.  You have sudden numbness or weakness in the foot or ankle below the affected area, or you have trouble moving your foot or ankle.  You have a fever or persistent symptoms for more than 2-3 days.  You have a  fever and your symptoms suddenly get worse. MAKE SURE YOU:   Understand these instructions.  Will watch your condition.  Will get help right away if you are not doing well or get worse. Document Released: 07/23/2006 Document Revised: 01/07/2013 Document Reviewed: 11/24/2012 [facility] Patient Information 2015 [place], [place]. This information is not intended to replace advice given to you by your health care provider. Make sure you discuss any questions you have with your health care provider.   Potassium Content of Foods Potassium is a mineral found in many foods and drinks. It helps keep fluids and minerals balanced in your body and affects how steadily your heart beats. Potassium also helps control your blood pressure and keep your muscles and nervous system healthy. Certain health conditions and medicines may change the balance of potassium in your  body. When this happens, you can help balance your level of potassium through the foods that you do or do not eat. Your health care provider or dietitian may recommend an amount of potassium that you should have each day. The following lists of foods provide the amount of potassium (in parentheses) per serving in each item. HIGH IN POTASSIUM  The following foods and beverages have 200 mg or more of potassium per serving:  Apricots, 2 raw or 5 dry (200 mg).  Artichoke, 1 medium (345 mg).  Avocado, raw,  each (245 mg).  Banana, 1 medium (425 mg).  Beans, lima, or baked beans, canned,  cup (280 mg).  Beans, [place], canned,  cup (595 mg).  Beef roast, 3 oz (320 mg).  Beef, ground, 3 oz (270 mg).  Beets, raw or cooked,  cup (260 mg).  Bran muffin, 2 oz (300 mg).  Broccoli,  cup (230 mg).  Brussels sprouts,  cup (250 mg).  Cantaloupe,  cup (215 mg).  Cereal, 100% bran,  cup (200-400 mg).  Cheeseburger, single, fast food, 1 each (225-400 mg).  Chicken, 3 oz (220 mg).  Clams, canned, 3 oz (535 mg).  Crab, 3 oz (225 mg).  Dates, 5 each (270 mg).  Dried beans and peas,  cup (300-475 mg).  Figs, dried, 2 each (260 mg).  Fish: halibut, tuna, cod, snapper, 3 oz (480 mg).  Fish: salmon, haddock, swordfish, perch, 3 oz (300 mg).  Fish, tuna, canned 3 oz (200 mg).  [place] fries, fast food, 3 oz (470 mg).  Granola with fruit and nuts,  cup (200 mg).  Grapefruit juice,  cup (200 mg).  Greens, beet,  cup (655 mg).  Honeydew melon,  cup (200 mg).  Kale, raw, 1 cup (300 mg).  Kiwi, 1 medium (240 mg).  Kohlrabi, rutabaga, parsnips,  cup (280 mg).  Lentils,  cup (365 mg).  Mango, 1 each (325 mg).  Milk, chocolate, 1 cup (420 mg).  Milk: nonfat, low-fat, whole, buttermilk, 1 cup (350-380 mg).  Molasses, 1 Tbsp (295 mg).  Mushrooms,  cup (280) mg.  Nectarine, 1 each (275 mg).  Nuts: almonds, peanuts, hazelnuts, [place], cashew, mixed, 1 oz (200  mg).  Nuts, pistachios, 1 oz (295 mg).  Orange, 1 each (240 mg).  Orange juice,  cup (235 mg).  Papaya, medium,  fruit (390 mg).  Peanut butter, chunky, 2 Tbsp (240 mg).  Peanut butter, smooth, 2 Tbsp (210 mg).  Pear, 1 medium (200 mg).  Pomegranate, 1 whole (400 mg).  Pomegranate juice,  cup (215 mg).  Pork, 3 oz (350 mg).  Potato chips, salted, 1 oz (465 mg).  Potato, baked with skin,  1 medium (925 mg).  Potatoes, boiled,  cup (255 mg).  Potatoes, mashed,  cup (330 mg).  Prune juice,  cup (370 mg).  Prunes, 5 each (305 mg).  Pudding, chocolate,  cup (230 mg).  Pumpkin, canned,  cup (250 mg).  Raisins, seedless,  cup (270 mg).  Seeds, sunflower or pumpkin, 1 oz (240 mg).  Soy milk, 1 cup (300 mg).  Spinach,  cup (420 mg).  Spinach, canned,  cup (370 mg).  Sweet potato, baked with skin, 1 medium (450 mg).  Swiss chard,  cup (480 mg).  Tomato or vegetable juice,  cup (275 mg).  Tomato sauce or puree,  cup (400-550 mg).  Tomato, raw, 1 medium (290 mg).  Tomatoes, canned,  cup (200-300 mg).  [place], 3 oz (250 mg).  Wheat germ, 1 oz (250 mg).  Winter squash,  cup (250 mg).  Yogurt, plain or fruited, 6 oz (260-435 mg).  Zucchini,  cup (220 mg). MODERATE IN POTASSIUM The following foods and beverages have 50-200 mg of potassium per serving:  Apple, 1 each (150 mg).  Apple juice,  cup (150 mg).  Applesauce,  cup (90 mg).  Apricot nectar,  cup (140 mg).  Asparagus, small spears,  cup or 6 spears (155 mg).  Bagel, cinnamon raisin, 1 each (130 mg).  Bagel, egg or plain, 4 in., 1 each (70 mg).  Beans, green,  cup (90 mg).  Beans, yellow,  cup (190 mg).  Beer, regular, 12 oz (100 mg).  Beets, canned,  cup (125 mg).  Blackberries,  cup (115 mg).  Blueberries,  cup (60 mg).  Bread, whole wheat, 1 slice (70 mg).  Broccoli, raw,  cup (145 mg).  Cabbage,  cup (150 mg).  Carrots, cooked or raw,  cup  (180 mg).  Cauliflower, raw,  cup (150 mg).  Celery, raw,  cup (155 mg).  Cereal, bran flakes, cup (120-150 mg).  Cheese, cottage,  cup (110 mg).  Cherries, 10 each (150 mg).  Chocolate, 1 oz bar (165 mg).  Coffee, brewed 6 oz (90 mg).  Corn,  cup or 1 ear (195 mg).  Cucumbers,  cup (80 mg).  Egg, large, 1 each (60 mg).  Eggplant,  cup (60 mg).  Endive, raw, cup (80 mg).  English muffin, 1 each (65 mg).  Fish, orange roughy, 3 oz (150 mg).  Frankfurter, beef or pork, 1 each (75 mg).  Fruit cocktail,  cup (115 mg).  Grape juice,  cup (170 mg).  Grapefruit,  fruit (175 mg).  Grapes,  cup (155 mg).  Greens: kale, turnip, collard,  cup (110-150 mg).  Ice cream or frozen yogurt, chocolate,  cup (175 mg).  Ice cream or frozen yogurt, vanilla,  cup (120-150 mg).  Lemons, limes, 1 each (80 mg).  Lettuce, all types, 1 cup (100 mg).  Mixed vegetables,  cup (150 mg).  Mushrooms, raw,  cup (110 mg).  Nuts: walnuts, pecans, or macadamia, 1 oz (125 mg).  Oatmeal,  cup (80 mg).  Okra,  cup (110 mg).  Onions, raw,  cup (120 mg).  Peach, 1 each (185 mg).  Peaches, canned,  cup (120 mg).  Pears, canned,  cup (120 mg).  Peas, green, frozen,  cup (90 mg).  Peppers, green,  cup (130 mg).  Peppers, red,  cup (160 mg).  Pineapple juice,  cup (165 mg).  Pineapple, fresh or canned,  cup (100 mg).  Plums, 1 each (105 mg).  Pudding, vanilla,  cup (150 mg).  Raspberries,  cup (90 mg).  Rhubarb,  cup (115 mg).  Rice, wild,  cup (80 mg).  Shrimp, 3 oz (155 mg).  Spinach, raw, 1 cup (170 mg).  Strawberries,  cup (125 mg).  Summer squash  cup (175-200 mg).  Swiss chard, raw, 1 cup (135 mg).  Tangerines, 1 each (140 mg).  Tea, brewed, 6 oz (65 mg).  Turnips,  cup (140 mg).  Watermelon,  cup (85 mg).  Wine, red, table, 5 oz (180 mg).  Wine, [place], table, 5 oz (100 mg). LOW IN POTASSIUM The following foods  and beverages have less than 50 mg of potassium per serving.  Bread, Nell, 1 slice (30 mg).  Carbonated beverages, 12 oz (less than 5 mg).  Cheese, 1 oz (20-30 mg).  Cranberries,  cup (45 mg).  Cranberry juice cocktail,  cup (20 mg).  Fats and oils, 1 Tbsp (less than 5 mg).  Hummus, 1 Tbsp (32 mg).  Nectar: papaya, mango, or pear,  cup (35 mg).  Rice, [place] or brown,  cup (50 mg).  Spaghetti or macaroni,  cup cooked (30 mg).  Tortilla, flour or corn, 1 each (50 mg).  Waffle, 4 in., 1 each (50 mg).  Water chestnuts,  cup (40 mg). Document Released: 10/31/2004 Document Revised: 03/24/2013 Document Reviewed: 02/13/2013 [facility] Patient Information 2015 [place], [place]. This information is not intended to replace advice given to you by your health care provider. Make sure you discuss any questions you have with your health care provider.

## 2013-12-21 NOTE — Progress Notes (Signed)
Subjective:    Patient ID: Meghan Russo, female    DOB: [birth year], 80 y.o.   MRN: [id number] Chief Complaint  Patient presents with  . Follow-up    wound leg    HPI   Doing much better. Has been having home health nursing change unna boots twice a week and healing well. No pain, no drainage, no odor.  Wore unna boot into office this morning and just had it removed.  Does not have any compression hose. Brought in by her daughter today.  Past Medical History  Diagnosis Date  . Hypertension   . Knee pain   . Edema   . Chronic kidney disease, stage III (moderate)   . Benign hypertensive kidney disease with chronic kidney disease stage I through stage IV, or unspecified   . Moderate aortic stenosis     moderate MR, severe TR  . Moderate to severe pulmonary hypertension   . Arthritis   . Cataract    Current Outpatient Prescriptions on File Prior to Visit  Medication Sig Dispense Refill  . acetaminophen-codeine (TYLENOL #3) 300-30 MG per tablet Take 1 tablet by mouth every 4 (four) hours as needed for moderate pain.  20 tablet  0  . aspirin 81 MG tablet Take 81 mg by mouth daily.      . carvedilol (COREG) 6.25 MG tablet Take 1 tablet (6.25 mg total) by mouth 2 (two) times daily with a meal.  180 tablet  3  . Cholecalciferol (VITAMIN D) 2000 UNITS CAPS Take 1 capsule by mouth daily.       . furosemide (LASIX) 40 MG tablet Take 1 tablet (40 mg total) by mouth 2 (two) times daily.  180 tablet  3  . hydrALAZINE (APRESOLINE) 50 MG tablet Take 1 tablet (50 mg total) by mouth 3 (three) times daily.  270 tablet  3  . lisinopril-hydrochlorothiazide (PRINZIDE,ZESTORETIC) 20-25 MG per tablet Take 1 tablet by mouth daily.      . Multiple Vitamins-Minerals (MULTIVITAMIN WITH MINERALS) tablet Take 1 tablet by mouth daily.       No current facility-administered medications on file prior to visit.   Allergies  Allergen Reactions  . Asa [Aspirin] Other (See Comments)    GI Upset     Review of  Systems  Constitutional: Negative for fever, chills, diaphoresis, activity change, appetite change and unexpected weight change.  Respiratory: Negative for chest tightness and shortness of breath.        No orthopnea  Cardiovascular: Positive for leg swelling. Negative for chest pain and palpitations.  Musculoskeletal: Negative for arthralgias and joint swelling.  Skin: Positive for color change and rash. Negative for pallor and wound.  Neurological: Negative for dizziness, syncope and light-headedness.  Hematological: Negative for adenopathy. Does not bruise/bleed easily.  Psychiatric/Behavioral: Positive for sleep disturbance.       Objective:  BP 150/44  Pulse 53  Temp(Src) 97.7 F (36.5 C) (Oral)  Resp 16  Ht  (1.676 m)  Wt 172 lb (78.019 kg)  BMI 27.77 kg/m2  SpO2 96%  Physical Exam  Constitutional: She is oriented to person, place, and time. She appears well-developed and well-nourished. No distress.  HENT:  Head: Normocephalic and atraumatic.  Right Ear: External ear normal.  Eyes: Conjunctivae are normal. No scleral icterus.  Cardiovascular:  1+ pitting edema bilaterally  Pulmonary/Chest: Effort normal.  Musculoskeletal: She exhibits edema. She exhibits no tenderness.  Neurological: She is alert and oriented to person, place, and time.  Skin: Skin is warm and dry. Rash noted. She is not diaphoretic. No erythema.  Lower extremities with thickened hyperpigented skin, Ulcerations healed, no drainage, warmth, or tenderness  Psychiatric: She has a normal mood and affect. Her behavior is normal.          Assessment & Plan:   Venous stasis dermatitis of both lower extremities - on lasix 40 bid for a long time - has never needed any K supplementation. Pt is a VERY hard stick - often had to get PA to draw blood or sent to drawing station so will not recheck bmp today as lasix dose stable chronically. Reviewed ways to increase K in diet and warned to f/u if any  myalgias/cramps.  Start wearing compression hose daily - rx given for 20-11mmHg compression.  Stasis ulcer of ankle, left - resolved with unna boots - ok to d/c for now  Tinea pedis of both feet - Cont to use antifungal powder between toes - having some maceration  Onychomycosis  Meds ordered this encounter  Medications  . nystatin (MYCOSTATIN/NYSTOP) 100000 UNIT/GM POWD    Sig: Apply 1 gram topically between toes bid    Dispense:  60 g    Refill:  3    Norberto Sorenson, MD MPH

## 2013-12-31 DIAGNOSIS — I350 Nonrheumatic aortic (valve) stenosis: Secondary | ICD-10-CM

## 2013-12-31 DIAGNOSIS — I340 Nonrheumatic mitral (valve) insufficiency: Secondary | ICD-10-CM | POA: Insufficient documentation

## 2013-12-31 DIAGNOSIS — I351 Nonrheumatic aortic (valve) insufficiency: Secondary | ICD-10-CM | POA: Insufficient documentation

## 2013-12-31 HISTORY — DX: Nonrheumatic aortic (valve) stenosis: I35.0

## 2014-01-19 ENCOUNTER — Emergency Department (HOSPITAL_COMMUNITY): Payer: Medicare Other

## 2014-01-19 ENCOUNTER — Encounter (HOSPITAL_COMMUNITY): Payer: Self-pay | Admitting: Emergency Medicine

## 2014-01-19 ENCOUNTER — Inpatient Hospital Stay (HOSPITAL_COMMUNITY)
Admission: EM | Admit: 2014-01-19 | Discharge: 2014-01-22 | DRG: 291 | Disposition: A | Discharge status: Home / Self Care | Payer: Medicare Other | Attending: Internal Medicine | Admitting: Internal Medicine

## 2014-01-19 DIAGNOSIS — I071 Rheumatic tricuspid insufficiency: Secondary | ICD-10-CM | POA: Diagnosis present

## 2014-01-19 DIAGNOSIS — D631 Anemia in chronic kidney disease: Secondary | ICD-10-CM | POA: Diagnosis present

## 2014-01-19 DIAGNOSIS — D649 Anemia, unspecified: Secondary | ICD-10-CM | POA: Diagnosis present

## 2014-01-19 DIAGNOSIS — I272 Other secondary pulmonary hypertension: Secondary | ICD-10-CM | POA: Diagnosis present

## 2014-01-19 DIAGNOSIS — I340 Nonrheumatic mitral (valve) insufficiency: Secondary | ICD-10-CM | POA: Diagnosis present

## 2014-01-19 DIAGNOSIS — Z8679 Personal history of other diseases of the circulatory system: Secondary | ICD-10-CM

## 2014-01-19 DIAGNOSIS — I130 Hypertensive heart and chronic kidney disease with heart failure and stage 1 through stage 4 chronic kidney disease, or unspecified chronic kidney disease: Secondary | ICD-10-CM | POA: Diagnosis present

## 2014-01-19 DIAGNOSIS — D696 Thrombocytopenia, unspecified: Secondary | ICD-10-CM | POA: Diagnosis present

## 2014-01-19 DIAGNOSIS — I350 Nonrheumatic aortic (valve) stenosis: Secondary | ICD-10-CM | POA: Diagnosis present

## 2014-01-19 DIAGNOSIS — M1990 Unspecified osteoarthritis, unspecified site: Secondary | ICD-10-CM | POA: Diagnosis present

## 2014-01-19 DIAGNOSIS — Z886 Allergy status to analgesic agent status: Secondary | ICD-10-CM

## 2014-01-19 DIAGNOSIS — I089 Rheumatic multiple valve disease, unspecified: Secondary | ICD-10-CM | POA: Diagnosis present

## 2014-01-19 DIAGNOSIS — I270 Primary pulmonary hypertension: Secondary | ICD-10-CM

## 2014-01-19 DIAGNOSIS — H269 Unspecified cataract: Secondary | ICD-10-CM | POA: Diagnosis present

## 2014-01-19 DIAGNOSIS — Z79899 Other long term (current) drug therapy: Secondary | ICD-10-CM

## 2014-01-19 DIAGNOSIS — N179 Acute kidney failure, unspecified: Secondary | ICD-10-CM | POA: Diagnosis present

## 2014-01-19 DIAGNOSIS — I509 Heart failure, unspecified: Secondary | ICD-10-CM

## 2014-01-19 DIAGNOSIS — N184 Chronic kidney disease, stage 4 (severe): Secondary | ICD-10-CM | POA: Diagnosis present

## 2014-01-19 DIAGNOSIS — N189 Chronic kidney disease, unspecified: Secondary | ICD-10-CM

## 2014-01-19 DIAGNOSIS — I10 Essential (primary) hypertension: Secondary | ICD-10-CM

## 2014-01-19 DIAGNOSIS — I5031 Acute diastolic (congestive) heart failure: Secondary | ICD-10-CM

## 2014-01-19 DIAGNOSIS — N183 Chronic kidney disease, stage 3 unspecified: Secondary | ICD-10-CM

## 2014-01-19 DIAGNOSIS — R600 Localized edema: Secondary | ICD-10-CM | POA: Diagnosis present

## 2014-01-19 DIAGNOSIS — I369 Nonrheumatic tricuspid valve disorder, unspecified: Secondary | ICD-10-CM

## 2014-01-19 DIAGNOSIS — N289 Disorder of kidney and ureter, unspecified: Secondary | ICD-10-CM

## 2014-01-19 DIAGNOSIS — Z7982 Long term (current) use of aspirin: Secondary | ICD-10-CM

## 2014-01-19 LAB — COMPREHENSIVE METABOLIC PANEL
ALBUMIN: 3.6 g/dL (ref 3.5–5.2)
ALK PHOS: 96 U/L (ref 39–117)
ALT: 7 U/L (ref 0–35)
AST: 16 U/L (ref 0–37)
Anion gap: 15 (ref 5–15)
BUN: 65 mg/dL — AB (ref 6–23)
CO2: 24 meq/L (ref 19–32)
Calcium: 9.2 mg/dL (ref 8.4–10.5)
Chloride: 103 mEq/L (ref 96–112)
Creatinine, Ser: 2.2 mg/dL — ABNORMAL HIGH (ref 0.50–1.10)
GFR calc non Af Amer: 18 mL/min — ABNORMAL LOW (ref >90)
GFR, EST AFRICAN AMERICAN: 21 mL/min — AB (ref >90)
GLUCOSE: 117 mg/dL — AB (ref 70–99)
POTASSIUM: 4.3 meq/L (ref 3.7–5.3)
Sodium: 142 mEq/L (ref 137–147)
Total Bilirubin: 0.7 mg/dL (ref 0.3–1.2)
Total Protein: 7.7 g/dL (ref 6.0–8.3)

## 2014-01-19 LAB — CBC
HEMATOCRIT: 28.7 % — AB (ref 36.0–46.0)
HEMOGLOBIN: 9.9 g/dL — AB (ref 12.0–15.0)
MCH: 33.6 pg (ref 26.0–34.0)
MCHC: 34.5 g/dL (ref 30.0–36.0)
MCV: 97.3 fL (ref 78.0–100.0)
Platelets: 125 10*3/uL — ABNORMAL LOW (ref 150–400)
RBC: 2.95 MIL/uL — AB (ref 3.87–5.11)
RDW: 16.1 % — ABNORMAL HIGH (ref 11.5–15.5)
WBC: 5.5 10*3/uL (ref 4.0–10.5)

## 2014-01-19 LAB — TROPONIN I
Troponin I: <0.3 ng/mL (ref <0.30)
Troponin I: <0.3 ng/mL (ref <0.30)
Troponin I: <0.3 ng/mL (ref <0.30)

## 2014-01-19 LAB — PRO B NATRIURETIC PEPTIDE: Pro B Natriuretic peptide (BNP): 13095 pg/mL — ABNORMAL HIGH (ref 0–450)

## 2014-01-19 LAB — MRSA PCR SCREENING: MRSA by PCR: POSITIVE — AB

## 2014-01-19 MED ORDER — HEPARIN SODIUM (PORCINE) 5000 UNIT/ML IJ SOLN
5000.0000 [IU] | Freq: Three times a day (TID) | INTRAMUSCULAR | Status: DC
  Administered 2014-01-19 – 2014-01-22 (×10): 5000 [IU] via SUBCUTANEOUS
  Filled 2014-01-19 (×14): qty 1

## 2014-01-19 MED ORDER — NYSTATIN 100000 UNIT/GM EX POWD
Freq: Two times a day (BID) | CUTANEOUS | Status: DC
  Administered 2014-01-19 – 2014-01-20 (×4): via TOPICAL
  Administered 2014-01-21: 1 g via TOPICAL
  Administered 2014-01-21: 10:00:00 via TOPICAL
  Filled 2014-01-19: qty 15

## 2014-01-19 MED ORDER — SODIUM CHLORIDE 0.9 % IV SOLN: 250.0000 mL | INTRAVENOUS | Status: DC | PRN

## 2014-01-19 MED ORDER — CARVEDILOL 6.25 MG PO TABS
6.2500 mg | ORAL_TABLET | Freq: Two times a day (BID) | ORAL | Status: DC
  Administered 2014-01-19 – 2014-01-22 (×5): 6.25 mg via ORAL
  Filled 2014-01-19 (×9): qty 1

## 2014-01-19 MED ORDER — ASPIRIN 81 MG PO CHEW
81.0000 mg | CHEWABLE_TABLET | Freq: Every day | ORAL | Status: DC
  Administered 2014-01-19 – 2014-01-22 (×4): 81 mg via ORAL
  Filled 2014-01-19 (×4): qty 1

## 2014-01-19 MED ORDER — ASPIRIN 81 MG PO TABS: 81.0000 mg | ORAL_TABLET | Freq: Every day | ORAL | Status: DC

## 2014-01-19 MED ORDER — SODIUM CHLORIDE 0.9 % IJ SOLN: 3.0000 mL | INTRAMUSCULAR | Status: DC | PRN

## 2014-01-19 MED ORDER — ACETAMINOPHEN 500 MG PO TABS
500.0000 mg | ORAL_TABLET | ORAL | Status: DC
  Administered 2014-01-19 – 2014-01-22 (×4): 500 mg via ORAL
  Filled 2014-01-19 (×5): qty 1

## 2014-01-19 MED ORDER — AMLODIPINE BESYLATE 10 MG PO TABS
10.0000 mg | ORAL_TABLET | Freq: Every day | ORAL | Status: DC
  Administered 2014-01-19 – 2014-01-22 (×4): 10 mg via ORAL
  Filled 2014-01-19 (×4): qty 1

## 2014-01-19 MED ORDER — HYDRALAZINE HCL 50 MG PO TABS
50.0000 mg | ORAL_TABLET | Freq: Three times a day (TID) | ORAL | Status: DC
  Administered 2014-01-19 – 2014-01-22 (×10): 50 mg via ORAL
  Filled 2014-01-19 (×12): qty 1

## 2014-01-19 MED ORDER — PNEUMOCOCCAL VAC POLYVALENT 25 MCG/0.5ML IJ INJ
0.5000 mL | INJECTION | INTRAMUSCULAR | Status: AC
  Administered 2014-01-20: 0.5 mL via INTRAMUSCULAR
  Filled 2014-01-19: qty 0.5

## 2014-01-19 MED ORDER — SODIUM CHLORIDE 0.9 % IJ SOLN
3.0000 mL | Freq: Two times a day (BID) | INTRAMUSCULAR | Status: DC
  Administered 2014-01-19 – 2014-01-21 (×5): 3 mL via INTRAVENOUS

## 2014-01-19 MED ORDER — FUROSEMIDE 10 MG/ML IJ SOLN
40.0000 mg | Freq: Two times a day (BID) | INTRAMUSCULAR | Status: DC
  Administered 2014-01-19 – 2014-01-21 (×3): 40 mg via INTRAVENOUS
  Filled 2014-01-19 (×7): qty 4

## 2014-01-19 NOTE — Progress Notes (Signed)
UR complete.  Kayzlee Wirtanen RN, MSN 

## 2014-01-19 NOTE — H&P (Addendum)
[facility] History and Physical  Meghan ChanceJanie H Lagrange ZOX:[id number] DOB: 03/19/1921 DOA: 01/19/2014  Referring physician: ED physician PCP: Gwynneth AlimentSANDERS,ROBYN N, MD  Specialists:   Chief Complaint: Bilateral leg edema  HPI: Meghan Russo is a 80 y.o. female with PMH of hypertension, aortic stenosis, mitral valve regurgitation, chronic kidney disease-stage III, who presents with bilateral leg edema.  Patient reports that she has bilateral leg edema in the past 2 or 3 months, which has been progressively worsening in the past several weeks. She also feel bloated and abdominal distension recently. Patient has been wearing compression stockings and taking lasix as prescribed to her by primary care doctor. She does not have fever, chills, cough, chest pain, shortness of breath, nausea, vomiting, abdominal pain or diarrhea.  She was found to have pulmonary edema on chest x-ray. [place]vated proBNP at 130[address]. Creatinine increased from baseline 1.2-1.5 to 2.2. She is admitted to inpatient for further evaluation and treatment.   Review of Systems: As presented in the history of presenting illness, rest negative.  Where does patient live?  Lives with her grandson at home.  Can patient participate in ADLs? barely  Allergy:  Allergies  Allergen Reactions  . Asa [Aspirin] Other (See Comments)    GI Upset    Past Medical History  Diagnosis Date  . Hypertension   . Knee pain   . Edema   . Chronic kidney disease, stage III (moderate)   . Benign hypertensive kidney disease with chronic kidney disease stage I through stage IV, or unspecified   . Moderate aortic stenosis     moderate MR, severe TR  . Moderate to severe pulmonary hypertension   . Arthritis   . Cataract     Past Surgical History  Procedure Laterality Date  . Vericose vein surgery      Social History:  reports that she has never smoked. She does not have any smokeless tobacco history on file. She reports that she does not drink  alcohol or use illicit drugs.  Family History: patient could not remember any of her family medical history.  Prior to Admission medications   Medication Sig Start Date End Date Taking? Authorizing Provider  acetaminophen (TYLENOL) 500 MG tablet Take 500 mg by mouth every morning.   Yes Historical Provider, MD  aspirin 81 MG tablet Take 81 mg by mouth daily.   Yes Historical Provider, MD  carvedilol (COREG) 6.25 MG tablet Take 1 tablet (6.25 mg total) by mouth 2 (two) times daily with a meal. 06/17/13  Yes Runell GessJonathan J Berry, MD  furosemide (LASIX) 40 MG tablet Take 1 tablet (40 mg total) by mouth 2 (two) times daily. 06/17/13  Yes Runell GessJonathan J Berry, MD  hydrALAZINE (APRESOLINE) 50 MG tablet Take 1 tablet (50 mg total) by mouth 3 (three) times daily. 06/17/13  Yes Runell GessJonathan J Berry, MD  lisinopril-hydrochlorothiazide (PRINZIDE,ZESTORETIC) 20-25 MG per tablet Take 1 tablet by mouth daily.   Yes Historical Provider, MD  nystatin (MYCOSTATIN/NYSTOP) 100000 UNIT/GM POWD Apply 1 gram topically between toes bid 12/17/13  Yes Sherren MochaEva N Shaw, MD    Physical Exam: Filed Vitals:   01/19/14 0345 01/19/14 0415 01/19/14 0445 01/19/14 0500  BP: 146/50 149/46 144/41 169/49  Pulse: 64 70 58 57  Temp:      TempSrc:      Resp: 11 17 15 18   Height:      Weight:      SpO2: 98% 99% 98% 98%   General: Not in acute distress HEENT:  Eyes: PERRL, EOMI, no scleral icterus       ENT: No discharge from the ears and nose, no pharynx injection, no tonsillar enlargement.        Neck: Positive JVD, no bruit, no mass felt. Cardiac: S1/S2, RRR, 3/6 systolic murmurs, No gallops or rubs Pulm: Good air movement bilaterally. Clear to auscultation bilaterally. No rales, wheezing, rhonchi or rubs. Abd: Soft, nondistended, nontender, no rebound pain, no organomegaly, BS present Ext:  2+DP/PT pulse bilaterally. She has 1+ pitting edema over her lower legs. There is healing skin tear over the left lateral malleolus, no signs of  infection Musculoskeletal: No joint erythema, or stiffness, ROM full Skin: No rashes.  Neuro: Alert and oriented X3, cranial nerves II-XII grossly intact, muscle strength 5/5 in all extremeties, sensation to light touch intact.  Psych: Patient is not psychotic, no suicidal or hemocidal ideation.  Labs on Admission:  Basic Metabolic Panel:  Recent Labs Lab 01/19/14 0100  NA 142  K 4.3  CL 103  CO2 24  GLUCOSE 117*  BUN 65*  CREATININE 2.20*  CALCIUM 9.2   Liver Function Tests:  Recent Labs Lab 01/19/14 0100  AST 16  ALT 7  ALKPHOS 96  BILITOT 0.7  PROT 7.7  ALBUMIN 3.6   No results found for this basename: LIPASE, AMYLASE,  in the last 168 hours No results found for this basename: AMMONIA,  in the last 168 hours CBC:  Recent Labs Lab 01/19/14 0100  WBC 5.5  HGB 9.9*  HCT 28.7*  MCV 97.3  PLT 125*   Cardiac Enzymes: No results found for this basename: CKTOTAL, CKMB, CKMBINDEX, TROPONINI,  in the last 168 hours  BNP (last 3 results)  Recent Labs  01/19/14 0100  PROBNP 13095.0*   CBG: No results found for this basename: GLUCAP,  in the last 168 hours  Radiological Exams on Admission: Dg Chest 2 View  01/19/2014   CLINICAL DATA:  Chest pain and cough. Bilateral lower extremity pitting edema. No additional acute clinical information provided.  EXAM: CHEST  2 VIEW  COMPARISON:  Chest radiograph March 06, 2012  FINDINGS: The cardiac silhouette appears moderate to severely enlarged, similar to prior imaging tortuous calcified aorta. Central pulmonary vascular congestion and moderate interstitial prominence. Small RIGHT pleural effusion. Patchy RIGHT lower lobe airspace opacity. No pneumothorax.  High-riding humeral heads suggest remote rotator cuff injury. Soft tissue planes are nonsuspicious.  IMPRESSION: Similar cardiomegaly with interstitial edema, small RIGHT pleural effusion. RIGHT lower lobe airspace opacity favors confluent Edema and/or atelectasis.    Electronically Signed   By: Awilda Metroourtnay  Bloomer   On: 01/19/2014 02:33    EKG: Independently reviewed.   Assessment/Plan Principal Problem:   Bilateral leg edema Active Problems:   Chronic renal insufficiency, stage III (moderate)   Essential hypertension, benign   Moderate aortic stenosis   Moderate mitral regurgitation   Anemia   Thrombocytopenia  1. bilateral leg edema: It is most likely caused by congestive heart failure. In consistent with this diagnosis, she has pulmonary edema on chest x-ray, JVD and elevated proBNP. She did not have 2-D echo since year 2005. Chronic kidney disease may have also contributed partially to her bilateral leg edema.   -will admit to tele bed -check 2d echo -Switch oral lasix to IV 40 mg bid, with understanding that this may worsen her renal function. However, I am hoping this will not happen due to the Starling mechanism. Her kidney may be better perfused with improved cardiac function -  bmp, Mg in AM -trop x 3  2. HTN: on Coreg, Lasix, hydralazine, Prinzide at home - will discontinue Prinzide in the setting of acute on chronic kidney disease - start amlodipine for Bp control - continue Coreg, hydralazine  3. Acute on CKD-III: Cre is up slightly - will trend cre by check BMP while escalating diuretics.   4. Anemia and thrombocytopenia:  unclear etiology. Hemoglobin stable. No bleeding tendency - will check anemia panel - CBC in am after started Heparin sq  DVT ppx: SQ Heparin   Code Status: Full code Family Communication:  Yes, patient's daughter at bed side Disposition Plan: Admit to inpatient   Date of Service 01/19/2014    Lorretta Harp [facility] Pager [phone]  If 7PM-7AM, please contact night-coverage [email or web address] Password [email or web address] 01/19/2014, 5:47 AM

## 2014-01-19 NOTE — ED Provider Notes (Signed)
CSN: [id number]     Arrival date & time 01/19/14  0026 History   First MD Initiated Contact with Patient 01/19/14 0044     Chief Complaint  Patient presents with  . Leg Swelling     (Consider location/radiation/quality/duration/timing/severity/associated sxs/prior Treatment) HPI 80 year old female presents to the emergency department from home with complaints of bilateral leg swelling, and feeling bloated.  Patient reports symptoms have been ongoing for many months.  Over the last few days to weeks symptoms have worsened.  Patient denies any shortness of breath, no chest pain.  She reports that she is having normal bowel movements.  She reports she simply wants to know why she is having swelling.  Patient has been wearing compression stockings as prescribed to her by her primary care Dr.  Huel Coteaughter reports that she has a small soft spot on the outside of her left ankle where she had a prior ulcer that has opened slightly.  There's been no drainage redness or pain.  Patient has been taking medications as prescribed. Past Medical History  Diagnosis Date  . Hypertension   . Knee pain   . Edema   . Chronic kidney disease, stage III (moderate)   . Benign hypertensive kidney disease with chronic kidney disease stage I through stage IV, or unspecified   . Moderate aortic stenosis     moderate MR, severe TR  . Moderate to severe pulmonary hypertension   . Arthritis   . Cataract    Past Surgical History  Procedure Laterality Date  . Vericose vein surgery     History reviewed. No pertinent family history. History  Substance Use Topics  . Smoking status: Never Smoker   . Smokeless tobacco: Not on file  . Alcohol Use: No   OB History   Grav Para Term Preterm Abortions TAB SAB Ect Mult Living                 Review of Systems  See History of Present Illness; otherwise all other systems are reviewed and negative   Allergies  Asa  Home Medications   Prior to Admission medications    Medication Sig Start Date End Date Taking? Authorizing Provider  acetaminophen-codeine (TYLENOL #3) 300-30 MG per tablet Take 1 tablet by mouth every 4 (four) hours as needed for moderate pain. 11/14/13   Sherren MochaEva N Shaw, MD  aspirin 81 MG tablet Take 81 mg by mouth daily.    Historical Provider, MD  carvedilol (COREG) 6.25 MG tablet Take 1 tablet (6.25 mg total) by mouth 2 (two) times daily with a meal. 06/17/13   Runell GessJonathan J Berry, MD  Cholecalciferol (VITAMIN D) 2000 UNITS CAPS Take 1 capsule by mouth daily.     Historical Provider, MD  furosemide (LASIX) 40 MG tablet Take 1 tablet (40 mg total) by mouth 2 (two) times daily. 06/17/13   Runell GessJonathan J Berry, MD  hydrALAZINE (APRESOLINE) 50 MG tablet Take 1 tablet (50 mg total) by mouth 3 (three) times daily. 06/17/13   Runell GessJonathan J Berry, MD  lisinopril-hydrochlorothiazide (PRINZIDE,ZESTORETIC) 20-25 MG per tablet Take 1 tablet by mouth daily.    Historical Provider, MD  Multiple Vitamins-Minerals (MULTIVITAMIN WITH MINERALS) tablet Take 1 tablet by mouth daily.    Historical Provider, MD  nystatin (MYCOSTATIN/NYSTOP) 100000 UNIT/GM POWD Apply 1 gram topically between toes bid 12/17/13   Sherren MochaEva N Shaw, MD   BP 162/49  Pulse 65  Temp(Src) 97.6 F (36.4 C) (Oral)  Resp 16  Ht 5\' 6"  (1.676  m)  Wt 150 lb (68.04 kg)  BMI 24.22 kg/m2  SpO2 99% Physical Exam  Nursing note and vitals reviewed. Constitutional: She is oriented to person, place, and time. She appears well-developed and well-nourished.  HENT:  Head: Normocephalic and atraumatic.  Nose: Nose normal.  Mouth/Throat: Oropharynx is clear and moist.  Eyes: Conjunctivae and EOM are normal. Pupils are equal, round, and reactive to light.  Neck: Normal range of motion. Neck supple. No JVD present. No tracheal deviation present. No thyromegaly present.  Cardiovascular: Normal rate, regular rhythm, normal heart sounds and intact distal pulses.  Exam reveals no gallop and no friction rub.   No murmur  heard. Pulmonary/Chest: Effort normal and breath sounds normal. No stridor. No respiratory distress. She has no wheezes. She has no rales. She exhibits no tenderness.  Abdominal: Soft. Bowel sounds are normal. She exhibits no distension and no mass. There is no tenderness. There is no rebound and no guarding.  Musculoskeletal: Normal range of motion. She exhibits edema (the patient has 1+ pitting edema to knees.  There is a line where the compression stockings and an above this area, she does have 2+ pitting edema of bilateral lower extremities). She exhibits no tenderness.  Left lateral malleolus examined.  There is a spot where the skin has sloughed that is less than 0.5 mm.  There is no ulceration, no redness, no drainage.  Patient has chronic skin changes  Lymphadenopathy:    She has no cervical adenopathy.  Neurological: She is alert and oriented to person, place, and time. She displays normal reflexes. She exhibits normal muscle tone. Coordination normal.  Skin: Skin is warm and dry. No rash noted. No erythema. No pallor.  Psychiatric: She has a normal mood and affect. Her behavior is normal. Judgment and thought content normal.    ED Course  Procedures (including critical care time) Labs Review Labs Reviewed  CBC - Abnormal; Notable for the following:    RBC 2.95 (*)    Hemoglobin 9.9 (*)    HCT 28.7 (*)    RDW 16.1 (*)    Platelets 125 (*)    All other components within normal limits  PRO B NATRIURETIC PEPTIDE - Abnormal; Notable for the following:    Pro B Natriuretic peptide (BNP) 13095.0 (*)    All other components within normal limits  COMPREHENSIVE METABOLIC PANEL - Abnormal; Notable for the following:    Glucose, Bld 117 (*)    BUN 65 (*)    Creatinine, Ser 2.20 (*)    GFR calc non Af Amer 18 (*)    GFR calc Af Amer 21 (*)    All other components within normal limits    Imaging Review Dg Chest 2 View  01/19/2014   CLINICAL DATA:  Chest pain and cough. Bilateral  lower extremity pitting edema. No additional acute clinical information provided.  EXAM: CHEST  2 VIEW  COMPARISON:  Chest radiograph March 06, 2012  FINDINGS: The cardiac silhouette appears moderate to severely enlarged, similar to prior imaging tortuous calcified aorta. Central pulmonary vascular congestion and moderate interstitial prominence. Small RIGHT pleural effusion. Patchy RIGHT lower lobe airspace opacity. No pneumothorax.  High-riding humeral heads suggest remote rotator cuff injury. Soft tissue planes are nonsuspicious.  IMPRESSION: Similar cardiomegaly with interstitial edema, small RIGHT pleural effusion. RIGHT lower lobe airspace opacity favors confluent Edema and/or atelectasis.   Electronically Signed   By: Awilda Metro   On: 01/19/2014 02:33     EKG Interpretation None  MDM   Final diagnoses:  Congestive heart failure, unspecified congestive heart failure chronicity, unspecified congestive heart failure type  Moderate aortic stenosis  Renal insufficiency   80 year old female with history of hypertension, chronic kidney disease, pulmonary hypertension and aortic stenosis, also with chronic peripheral edema who presents with gradual worsening of her ongoing edema.  Plan for labs, assessment for possible CHF exacerbation, but this does not appear to be present on my exam.  Patient does not appear to be obstructed, has been having bowel movements and does not have an distended abdomen   3:52 AM Pt with increased BNP as well as cardiomegaly with edema, worsening renal function.  Will d/w hospitalist for admission for careful diuresis.  Olivia Mackielga M Gimena Buick, MD 01/19/14 [phone]

## 2014-01-19 NOTE — ED Notes (Signed)
Pt c/o bilateral leg swelling. Pt unable to state when swelling started. Pt presents with pitting edema to bilateral legs. Pt denies pain at this time.

## 2014-01-19 NOTE — Progress Notes (Signed)
   Follow Up Note  Pt admitted earlier this morning.  Seen after arrived to floor.   Patient is soft and okay. Denies any shortness of breath or chest discomfort. Fatigued. Exam: CV: Regular rate and rhythm, S1-S2, holosystolic murmur Lungs: Clear to auscultation bilaterally Abd: Soft, obese, nontender, positive bowel sounds Ext: 3+ pitting edema bilaterally  Present on Admission:  . Essential hypertension, benign .  . acute renal failure in the setting of Chronic renal insufficiency, stage III (moderate): Hopefully will improve with diuresis. Continue to monitor. If worsens by tomorrow, consult nephrology. Creatinine baseline around 1.5  . Anemia'Thrombocytopenia: Stable   History of rheumatic heart disease and right-sided heart failure secondary to tricuspid regurg severe causing him severe pulmonary hypertension plus moderate mitral regurg: Likely cause of edema. Cardiology on board, trial of Lasix

## 2014-01-19 NOTE — ED Notes (Signed)
Attempted to call report

## 2014-01-19 NOTE — Progress Notes (Signed)
Echo Lab  2D Echocardiogram completed.  Cristin Penaflor L Jaja Switalski, RDCS 01/19/2014 2:23 PM

## 2014-01-19 NOTE — ED Notes (Signed)
Called service response for breakfast tray.

## 2014-01-19 NOTE — Consult Note (Signed)
CARDIOLOGY CONSULT NOTE   Patient ID: Meghan ChanceJanie H Selk MRN: [id number], DOB/AGE: 78/10/1920   Admit date: 01/19/2014 Date of Consult: 01/19/2014   Primary Physician: Gwynneth AlimentSANDERS,ROBYN N, MD Primary Cardiologist: Dr. Allyson SabalBerry  Pt. Profile  80 year old [place] woman admitted because of worsening leg edema.  Problem List  Past Medical History  Diagnosis Date  . Hypertension   . Knee pain   . Edema   . Chronic kidney disease, stage III (moderate)   . Benign hypertensive kidney disease with chronic kidney disease stage I through stage IV, or unspecified   . Moderate aortic stenosis     moderate MR, severe TR  . Moderate to severe pulmonary hypertension   . Arthritis   . Cataract     Past Surgical History  Procedure Laterality Date  . Vericose vein surgery       Allergies  Allergies  Allergen Reactions  . Asa [Aspirin] Other (See Comments)    GI Upset    HPI   This 80 year old African American woman was admitted on 01/19/14 because of worsening edema of the lower legs and knees.  She has a past history of multi-valvular heart disease.  She has been followed in the past by Dr. Allyson SabalBerry who last saw her in March 2015.  She has a past history of rheumatic heart disease when she was in her 1920s.  She has multi-valvular heart disease including a prior echocardiogram in 2013 demonstrating mild to moderate aortic stenosis, mild aortic insufficiency, mild mitral regurgitation, and severe tricuspid regurgitation with severe pulmonary hypertension.  She has a past history of essential hypertension and a history of chronic renal insufficiency.  She denies any chest pain.  She denies any shortness of breath with ordinary activity.  She denies any paroxysmal nocturnal dyspnea.  She does sleep on 2 pillows.  Her complaint is that her legs are bothering her and that is why she came to the hospital. Her chest x-ray shows significant cardiomegaly. Her EKG from the emergency room is a poor  technical quality.  The P waves are not readily seen.  The heart rate is not increased. The patient denies any history of TIA or stroke symptoms. Her family history is negative for known rheumatic heart disease. Her social history reveals that she is a retired Agricultural engineernursing assistant.  She worked at the [employer] [facility] for more than 30 years. She lives with her grandson.  Inpatient Medications  . acetaminophen  500 mg Oral [id number]  . amLODipine  10 mg Oral Daily  . aspirin  81 mg Oral Daily  . carvedilol  6.25 mg Oral BID WC  . furosemide  40 mg Intravenous BID  . heparin  5,000 Units Subcutaneous 3 times per day  . hydrALAZINE  50 mg Oral TID  . nystatin   Topical BID  . sodium chloride  3 mL Intravenous Q12H    Family History History reviewed. No pertinent family history.   Social History History   Social History  . Marital Status: Widowed    Spouse Name: N/A    Number of Children: N/A  . Years of Education: N/A   Occupational History  . Not on file.   Social History Main Topics  . Smoking status: Never Smoker   . Smokeless tobacco: Never Used  . Alcohol Use: No  . Drug Use: No  . Sexual Activity: Not on file   Other Topics Concern  . Not on file   Social History Narrative  .  No narrative on file     Review of Systems  General:  No chills, fever, night sweats or weight changes.  Cardiovascular:  No chest pain, dyspnea on exertion, edema, orthopnea, palpitations, paroxysmal nocturnal dyspnea. Dermatological: No rash, lesions/masses Respiratory: No cough, dyspnea Urologic: No hematuria, dysuria Abdominal:   No nausea, vomiting, diarrhea, bright red blood per rectum, melena, or hematemesis Neurologic:  No visual changes, wkns, changes in mental status. All other systems reviewed and are otherwise negative except as noted above.  Physical Exam  Blood pressure 166/64, pulse 60, temperature 97.7 F (36.5 C), temperature source Axillary, resp. rate 18,  height 5\' 6"  (1.676 m), weight 150 lb (68.04 kg), SpO2 98.00%.  General: Pleasant, NAD Psych: Normal affect. Neuro: Alert and oriented X 3. Moves all extremities spontaneously. HEENT: Normal  Neck: Supple without bruits.  Her jugular venous pressure is elevated. Lungs:  Resp regular and unlabored, CTA.  Decreased breath sounds at the bases Heart: The heart rate is slightly irregular.  There is a grade 3/6 systolic ejection murmur at the base.  There is a soft decrescendo murmur of aortic insufficiency at the left sternal edge.  There is a grade 1-2 apical systolic murmur.  I don't hear any mitral stenosis rumble.  No gallop or rub Abdomen: Soft, non-tender, non-distended, BS + x 4.  The liver is mildly pulsatile. Extremities: Marked chronic stasis dermatitis changes.  Pedal pulses difficult to feel.  There is swelling predominantly at the level of her knees.  The lower legs are not significantly edematous.  Labs   Recent Labs  01/19/14 0552  TROPONINI <0.30   Lab Results  Component Value Date   WBC 5.5 01/19/2014   HGB 9.9* 01/19/2014   HCT 28.7* 01/19/2014   MCV 97.3 01/19/2014   PLT 125* 01/19/2014     Recent Labs Lab 01/19/14 0100  NA 142  K 4.3  CL 103  CO2 24  BUN 65*  CREATININE 2.20*  CALCIUM 9.2  PROT 7.7  BILITOT 0.7  ALKPHOS 96  ALT 7  AST 16  GLUCOSE 117*   No results found for this basename: CHOL,  HDL,  LDLCALC,  TRIG   No results found for this basename: DDIMER    Radiology/Studies  Dg Chest 2 View  01/19/2014   CLINICAL DATA:  Chest pain and cough. Bilateral lower extremity pitting edema. No additional acute clinical information provided.  EXAM: CHEST  2 VIEW  COMPARISON:  Chest radiograph March 06, 2012  FINDINGS: The cardiac silhouette appears moderate to severely enlarged, similar to prior imaging tortuous calcified aorta. Central pulmonary vascular congestion and moderate interstitial prominence. Small RIGHT pleural effusion. Patchy RIGHT  lower lobe airspace opacity. No pneumothorax.  High-riding humeral heads suggest remote rotator cuff injury. Soft tissue planes are nonsuspicious.  IMPRESSION: Similar cardiomegaly with interstitial edema, small RIGHT pleural effusion. RIGHT lower lobe airspace opacity favors confluent Edema and/or atelectasis.   Electronically Signed   By: Awilda Metroourtnay  Bloomer   On: 01/19/2014 02:33    ECG  Possible atrial fibrillation versus junctional rhythm. Poor data quality in current ECG precludes serial comparison. Will repeat.   ASSESSMENT AND PLAN  1. rheumatic heart disease with multi-valvular heart disease and chronic cardiomegaly. 2. hypertensive cardiovascular disease.  She has had negative renal Dopplers in the past. 3. congestive heart failure predominantly right-sided with past history of severe pulmonary hypertension secondary to severe tricuspid regurgitation. 4. chronic renal insufficiency stage III 5. anemia and thrombocytopenia  Plan: We will  update her echocardiogram.  Agree with trial of IV Lasix.  Watch renal function closely. She is not having any symptoms of chest pain or dyspnea. I do not anticipate any invasive tests in this elderly woman. We will repeat an EKG to determine her current cardiac rhythm.  If she is in atrial fibrillation her chaddsvasc score is at least 5 and that she would need to be considered for long-term anticoagulation if no contraindication is found.  Anemia workup in progress.  Anemia likely secondary to chronic renal disease. Will follow with you.  Signed, Cassell Clement, MD  01/19/2014, 9:44 AM

## 2014-01-19 NOTE — Progress Notes (Signed)
PT Cancellation Note  Patient Details Name: Meghan Russo MRN: [id number] DOB: 11/06/1920   Cancelled Treatment:    Reason Eval/Treat Not Completed: Other (comment) (Currently on strict bedrest) Please update activity level when appropriate.  PT to check back on pt 01/20/14.      Donnella ShamSawulski, Rickey Sadowski J 01/19/2014, 9:05 AM Lavona MoundMark Antonetta Clanton, PT  [phone] 01/19/2014

## 2014-01-20 DIAGNOSIS — D649 Anemia, unspecified: Secondary | ICD-10-CM

## 2014-01-20 DIAGNOSIS — I130 Hypertensive heart and chronic kidney disease with heart failure and stage 1 through stage 4 chronic kidney disease, or unspecified chronic kidney disease: Secondary | ICD-10-CM | POA: Diagnosis not present

## 2014-01-20 DIAGNOSIS — R600 Localized edema: Secondary | ICD-10-CM

## 2014-01-20 DIAGNOSIS — I5031 Acute diastolic (congestive) heart failure: Secondary | ICD-10-CM

## 2014-01-20 DIAGNOSIS — I10 Essential (primary) hypertension: Secondary | ICD-10-CM

## 2014-01-20 DIAGNOSIS — I350 Nonrheumatic aortic (valve) stenosis: Secondary | ICD-10-CM

## 2014-01-20 LAB — VITAMIN B12: Vitamin B-12: 390 pg/mL (ref 211–911)

## 2014-01-20 LAB — BASIC METABOLIC PANEL
Anion gap: 11 (ref 5–15)
BUN: 67 mg/dL — AB (ref 6–23)
CHLORIDE: 105 meq/L (ref 96–112)
CO2: 26 meq/L (ref 19–32)
CREATININE: 2.22 mg/dL — AB (ref 0.50–1.10)
Calcium: 8.8 mg/dL (ref 8.4–10.5)
GFR calc Af Amer: 21 mL/min — ABNORMAL LOW (ref >90)
GFR calc non Af Amer: 18 mL/min — ABNORMAL LOW (ref >90)
GLUCOSE: 96 mg/dL (ref 70–99)
Potassium: 4.1 mEq/L (ref 3.7–5.3)
Sodium: 142 mEq/L (ref 137–147)

## 2014-01-20 LAB — CBC
HEMATOCRIT: 26.5 % — AB (ref 36.0–46.0)
HEMOGLOBIN: 9 g/dL — AB (ref 12.0–15.0)
MCH: 32.8 pg (ref 26.0–34.0)
MCHC: 34 g/dL (ref 30.0–36.0)
MCV: 96.7 fL (ref 78.0–100.0)
Platelets: 127 10*3/uL — ABNORMAL LOW (ref 150–400)
RBC: 2.74 MIL/uL — ABNORMAL LOW (ref 3.87–5.11)
RDW: 15.9 % — ABNORMAL HIGH (ref 11.5–15.5)
WBC: 3.9 10*3/uL — ABNORMAL LOW (ref 4.0–10.5)

## 2014-01-20 LAB — PROTIME-INR
INR: 1.24 (ref 0.00–1.49)
PROTHROMBIN TIME: 15.7 s — AB (ref 11.6–15.2)

## 2014-01-20 LAB — IRON AND TIBC
Iron: 58 ug/dL (ref 42–135)
Saturation Ratios: 26 % (ref 20–55)
TIBC: 221 ug/dL — AB (ref 250–470)
UIBC: 163 ug/dL (ref 125–400)

## 2014-01-20 LAB — FOLATE: Folate: 13.9 ng/mL

## 2014-01-20 LAB — RETICULOCYTES
RBC.: 2.72 MIL/uL — ABNORMAL LOW (ref 3.87–5.11)
Retic Count, Absolute: 27.2 10*3/uL (ref 19.0–186.0)
Retic Ct Pct: 1 % (ref 0.4–3.1)

## 2014-01-20 LAB — FERRITIN: FERRITIN: 66 ng/mL (ref 10–291)

## 2014-01-20 LAB — MAGNESIUM: Magnesium: 2.4 mg/dL (ref 1.5–2.5)

## 2014-01-20 NOTE — Progress Notes (Signed)
Pt had 22 beat run of Vtach.  MD notified.

## 2014-01-20 NOTE — Evaluation (Signed)
Physical Therapy Evaluation Patient Details Name: Meghan Russo MRN: [id number] DOB: 02/23/1921 Today's Date: 01/20/2014   History of Present Illness  80 y.o. female admitted with essential HTN, acute renal failure in the setting of Chronic renal insufficiency, stage III, and Thrombocytopenia.  Clinical Impression  Pt admitted with the above. Pt currently with functional limitations due to the deficits listed below (see PT Problem List). Ambulates up to 60 feet today using a rolling walker, some difficulty attempting to stand due to LE weakness. Demonstrates much better stability with the use of a walker vs single point cane. Pt will benefit from skilled PT to increase their independence and safety with mobility to allow discharge to the venue listed below.       Follow Up Recommendations Home health PT;Supervision - Intermittent    Equipment Recommendations  None recommended by PT    Recommendations for Other Services OT consult     Precautions / Restrictions Precautions Precautions: Fall Restrictions Weight Bearing Restrictions: No      Mobility  Bed Mobility Overal bed mobility: Modified Independent                Transfers Overall transfer level: Needs assistance Equipment used: Rolling walker (2 wheeled);Straight cane Transfers: Sit to/from Stand Sit to Stand: Min assist;Min guard         General transfer comment: Close min guard for safety, required pt several attempts to stand. Moderate sway upon standing with use of a single point cane, much improved with support from a rolling walker. VC for technique.   Ambulation/Gait Ambulation/Gait assistance: Supervision;Min guard Ambulation Distance (Feet): 60 Feet (10 feet with SPC -- 60 feet with RW) Assistive device: Rolling walker (2 wheeled);Straight cane Gait Pattern/deviations: Step-through pattern;Decreased stride length;Trunk flexed   Gait velocity interpretation: Below normal speed for  age/gender General Gait Details: Educated on safe use of DME with a rolling walker and SPC. VC for placement. Pt furniture walking with SPC and mild sway. Correct with RW however required cues for upright posture and walker placement for proximity. No loss of balance with RW noted.  Stairs            Wheelchair Mobility    Modified Rankin (Stroke Patients Only)       Balance Overall balance assessment: Needs assistance Sitting-balance support: No upper extremity supported;Feet supported Sitting balance-Leahy Scale: Good     Standing balance support: Single extremity supported Standing balance-Leahy Scale: Poor                               Pertinent Vitals/Pain Pain Assessment: No/denies pain    Home Living Family/patient expects to be discharged to:: Private residence Living Arrangements: Other relatives (grandson) Available Help at Discharge: Family;Other (Comment) (grandson works during the day) Type of Home: House Home Access: Level entry     Home Layout: One level Home Equipment: Walker - standard;Cane - single point;Bedside commode Additional Comments: Pt reports daughter visits daily to cook breakfast and lunch    Prior Function Level of Independence: Needs assistance   Gait / Transfers Assistance Needed: Gilmer MorCane primarily for ambulation, occasional use of standard walker.  ADL's / Homemaking Assistance Needed: Rarely cooks, can bath/dress herself        Hand Dominance   Dominant Hand: Right    Extremity/Trunk Assessment   Upper Extremity Assessment: Defer to OT evaluation           Lower Extremity Assessment:  Generalized weakness (Gross edema above knees BIL.)         Communication   Communication: HOH  Cognition Arousal/Alertness: Awake/alert Behavior During Therapy: Flat affect Overall Cognitive Status: Within Functional Limits for tasks assessed                      General Comments General comments (skin  integrity, edema, etc.): Edema BIL LEs at knees and proximally.    Exercises General Exercises - Lower Extremity Ankle Circles/Pumps: AROM;Both;10 reps;Seated Long Arc Quad: Strengthening;Both;10 reps;Seated Hip Flexion/Marching: Strengthening;Both;10 reps;Seated      Assessment/Plan    PT Assessment Patient needs continued PT services  PT Diagnosis Abnormality of gait;Generalized weakness   PT Problem List Decreased strength;Decreased range of motion;Decreased activity tolerance;Decreased balance;Decreased mobility;Decreased knowledge of use of DME;Cardiopulmonary status limiting activity  PT Treatment Interventions DME instruction;Gait training;Functional mobility training;Therapeutic activities;Therapeutic exercise;Balance training;Neuromuscular re-education;Patient/family education   PT Goals (Current goals can be found in the Care Plan section) Acute Rehab PT Goals Patient Stated Goal: Go home with HHPT PT Goal Formulation: With patient Time For Goal Achievement: 01/27/14 Potential to Achieve Goals: Good    Frequency Min 3X/week   Barriers to discharge Decreased caregiver support son works during the day - daugther visits intermittently and as needed    Co-evaluation               End of Session Equipment Utilized During Treatment: Gait belt Activity Tolerance: Patient tolerated treatment well Patient left: in chair;with call bell/phone within reach;with family/visitor present           Time: 1102-1132 PT Time Calculation (min): 30 min   Charges:   PT Evaluation $Initial PT Evaluation Tier I: 1 Procedure PT Treatments $Gait Training: 8-22 mins   PT G Codes:         [employer]ale[employer] [employer] Sharlee Rufino, [place] CarolinaPT [phone]  Berton MountBarbour, Eisa Conaway S 01/20/2014, 12:29 PM

## 2014-01-20 NOTE — Progress Notes (Signed)
Miss Meghan AstersWhite and daughter requested new rolling walker for when she returns home.  She has a walker, but it has no wheels on it and she has a difficult time maneuvering it.

## 2014-01-20 NOTE — Progress Notes (Signed)
Patient Profile: 80 y/o AA female with history of rheumatic heart disease with multi-valvular heart disease and chronic renal insufficiency admitted because of worsening leg edema. Pro BNP on arrival was [id number]. CXR with cardiomegaly w/ small RIGHT pleural. effusion   Prior echocardiogram in 2013 demonstrated mild to moderate aortic stenosis, mild aortic insufficiency, mild mitral regurgitation, and severe tricuspid regurgitation with severe pulmonary hypertension.   Subjective:  No complaints. Denies leg/ knee pain. No chest pain or dyspnea.   Objective: Vital signs in last 24 hours: Temp:  [98.3 F (36.8 C)-98.7 F (37.1 C)] 98.4 F (36.9 C) (10/21 0853) Pulse Rate:  [57-75] 62 (10/21 0853) Resp:  [18-20] 20 (10/21 0853) BP: (111-128)/(36-69) 111/58 mmHg (10/21 0853) SpO2:  [98 %-99 %] 99 % (10/21 0853) Weight:  [180 lb 1.6 oz (81.693 kg)] 180 lb 1.6 oz (81.693 kg) (10/21 0233) Last BM Date: 01/19/14  Intake/Output from previous day: 10/20 0701 - 10/21 0700 In: 240 [P.O.:240] Out: -  Intake/Output this shift: Total I/O In: 240 [P.O.:240] Out: -   Medications Current Facility-Administered Medications  Medication Dose Route Frequency Provider Last Rate Last Dose  . 0.9 %  sodium chloride infusion  250 mL Intravenous PRN Lorretta HarpXilin Niu, MD      . acetaminophen (TYLENOL) tablet 500 mg  500 mg Oral Leo GrosserBH-q7a Xilin Niu, MD   500 mg at 01/20/14 [phone]  . amLODipine (NORVASC) tablet 10 mg  10 mg Oral Daily Lorretta HarpXilin Niu, MD   10 mg at 01/19/14 0818  . aspirin chewable tablet 81 mg  81 mg Oral Daily Lorretta HarpXilin Niu, MD   81 mg at 01/19/14 1047  . carvedilol (COREG) tablet 6.25 mg  6.25 mg Oral BID WC Lorretta HarpXilin Niu, MD   6.25 mg at 01/19/14 1756  . furosemide (LASIX) injection 40 mg  40 mg Intravenous BID Lorretta HarpXilin Niu, MD   40 mg at 01/19/14 1756  . heparin injection 5,000 Units  5,000 Units Subcutaneous 3 times per day Lorretta HarpXilin Niu, MD   5,000 Units at 01/20/14 [id number]  . hydrALAZINE (APRESOLINE) tablet 50 mg   50 mg Oral TID Lorretta HarpXilin Niu, MD   50 mg at 01/19/14 2155  . nystatin (MYCOSTATIN/NYSTOP) topical powder   Topical BID Lorretta HarpXilin Niu, MD      . pneumococcal 23 valent vaccine (PNU-IMMUNE) injection 0.5 mL  0.5 mL Intramuscular Tomorrow-1000 Hollice EspySendil K Krishnan, MD      . sodium chloride 0.9 % injection 3 mL  3 mL Intravenous Q12H Lorretta HarpXilin Niu, MD   3 mL at 01/19/14 2156  . sodium chloride 0.9 % injection 3 mL  3 mL Intravenous PRN Lorretta HarpXilin Niu, MD        PE: General appearance: alert, cooperative and no distress Neck: no JVD Lungs: clear to auscultation bilaterally Heart: regular rate and rhythm and 3/6 SM best heard at the RUSB and LLSB Extremities: edematous bilateral LEE proximally. no significant edema distal from the knees Pulses: 2+ and symmetric Skin: warm and dry Neurologic: Grossly normal  Lab Results:   Recent Labs  01/19/14 0100 01/20/14 0359  WBC 5.5 3.9*  HGB 9.9* 9.0*  HCT 28.7* 26.5*  PLT 125* 127*   BMET  Recent Labs  01/19/14 0100 01/20/14 0359  NA 142 142  K 4.3 4.1  CL 103 105  CO2 24 26  GLUCOSE 117* 96  BUN 65* 67*  CREATININE 2.20* 2.22*  CALCIUM 9.2 8.8   PT/INR  Recent Labs  01/20/14 0359  LABPROT 15.7*  INR  1.24    Studies/Results:  2D echo 01/19/14 Study Conclusions  - Left ventricle: The cavity size was normal. Wall thickness was increased in a pattern of severe LVH. Systolic function was normal. The estimated ejection fraction was in the range of 55% to 60%. Wall motion was normal; there were no regional wall motion abnormalities. Doppler parameters are consistent with restrictive physiology, indicative of decreased left ventricular diastolic compliance and/or increased left atrial pressure. - Aortic valve: There was moderate stenosis. There was moderate regurgitation. Valve area (VTI): 1.1 cm^2. Valve area (Vmax): 1.08 cm^2. Valve area (Vmean): 1.21 cm^2. - Mitral valve: Calcified annulus. Mildly thickened leaflets . There was mild  regurgitation. - Right ventricle: The cavity size was moderately dilated. - Right atrium: The atrium was severely dilated. - Tricuspid valve: There was severe regurgitation. - Pulmonary arteries: PA peak pressure: 39 mm Hg (S).    Assessment/Plan  Principal Problem:   Bilateral leg edema Active Problems:   Chronic renal insufficiency, stage III (moderate)   Essential hypertension, benign   Moderate aortic stenosis   Moderate mitral regurgitation   Anemia   Thrombocytopenia   History of rheumatic heart disease   Moderate to severe pulmonary hypertension   Severe tricuspid regurgitation   ARF (acute renal failure)  1. LEE: BNP elevated at 13K, but also in the setting of CRI with Scr at 2.22. 2D echo reveals normal systolic function with EF at 55-60%. Don't think this is cardiac related.    2. Multivalvular disease: recent echo 10/20 demonstrated moderate AS, moderate AI. Aortic valve area 1.1 cm^2. Mild MR and severe TR. asymptomatic. Medical therapy given advanced age.   3. Anemia: management per TRH.    LOS: 1 day    Brittainy M. Sharol HarnessSimmons, PA-C 01/20/2014 10:01 AM  Echo Study Conclusions from 01/19/14  - Left ventricle: The cavity size was normal. Wall thickness was increased in a pattern of severe LVH. Systolic function was normal. The estimated ejection fraction was in the range of 55% to 60%. Wall motion was normal; there were no regional wall motion abnormalities. Doppler parameters are consistent with restrictive physiology, indicative of decreased left ventricular diastolic compliance and/or increased left atrial pressure. - Aortic valve: There was moderate stenosis. There was moderate regurgitation. Valve area (VTI): 1.1 cm^2. Valve area (Vmax): 1.08 cm^2. Valve area (Vmean): 1.21 cm^2. - Mitral valve: Calcified annulus. Mildly thickened leaflets . There was mild regurgitation. - Right ventricle: The cavity size was moderately dilated. - Right atrium: The  atrium was severely dilated. - Tricuspid valve: There was severe regurgitation. - Pulmonary arteries: PA peak pressure: 39 mm Hg (S).    Patient seen and examined. Agree with assessment and plan. Echo data reviewed; NL systolic function with Grade 3 diastolic dysfunction with restrictive physiology. Mild pulmonary hypertension, improved from previously moderately severe.  Diastolic heart failure.  Moderate AS/AR, mild MR and severe TR.  Cr 2.22 and HCT 26.5. ECG with AF on ASA and sq heparin. Anemia evaluation   Lennette Biharihomas A. Kelly, MD, [facility] HospitalFACC 01/20/2014 10:40 AM

## 2014-01-20 NOTE — Progress Notes (Signed)
[facility] PROGRESS NOTE  Meghan Russo ZOX:[id number] DOB: 09/04/1920 DOA: 01/19/2014  PCP: Gwynneth AlimentSANDERS,ROBYN N, MD  Brief HPI: Meghan Russo is a 80 y.o. female with PMH of hypertension, aortic stenosis, mitral valve regurgitation, chronic kidney disease-stage III, who presents with bilateral leg edema. Patient reported that she has bilateral leg edema in the past 2 or 3 months, which has been progressively worsening in the past several weeks.   Past medical history:  Past Medical History  Diagnosis Date  . Hypertension   . Knee pain   . Edema   . Chronic kidney disease, stage III (moderate)   . Benign hypertensive kidney disease with chronic kidney disease stage I through stage IV, or unspecified   . Moderate aortic stenosis     moderate MR, severe TR  . Moderate to severe pulmonary hypertension   . Arthritis   . Cataract     Consultants: Cardiology  Procedures:  2D ECHO Study Conclusions - Left ventricle: The cavity size was normal. Wall thickness was increased in a pattern of severe LVH. Systolic function was normal. The estimated ejection fraction was in the range of 55% to 60%. Wall motion was normal; there were no regional wall motion abnormalities. Doppler parameters are consistent with restrictive physiology, indicative of decreased left ventricular diastolic compliance and/or increased left atrial pressure. - Aortic valve: There was moderate stenosis. There was moderate regurgitation. Valve area (VTI): 1.1 cm^2. Valve area (Vmax): 1.08 cm^2. Valve area (Vmean): 1.21 cm^2. - Mitral valve: Calcified annulus. Mildly thickened leaflets. There was mild regurgitation. - Right ventricle: The cavity size was moderately dilated. - Right atrium: The atrium was severely dilated. - Tricuspid valve: There was severe regurgitation. - Pulmonary arteries: PA peak pressure: 39 mm Hg (S).   Antibiotics: None  Subjective: Patient feels better today. Less short of breath. No  chest pain.  Objective: Vital Signs  Filed Vitals:   01/20/14 0853 01/20/14 1102 01/20/14 1128 01/20/14 1358  BP: 111/58   118/51  Pulse: 62 61 68 69  Temp: 98.4 F (36.9 C)   98 F (36.7 C)  TempSrc: Oral   Oral  Resp: 20   18  Height:      Weight:      SpO2: 99% 95% 94% 98%    Intake/Output Summary (Last 24 hours) at 01/20/14 1514 Last data filed at 01/20/14 1234  Gross per 24 hour  Intake    360 ml  Output      0 ml  Net    360 ml   Filed Weights   01/19/14 0038 01/20/14 0233  Weight: 68.04 kg (150 lb) 81.693 kg (180 lb 1.6 oz)    General appearance: alert, cooperative, appears stated age and no distress Head: Normocephalic, without obvious abnormality, atraumatic Resp: decreased air entry at the bases. no wheezing. few crackles. Cardio: s1s2 normal regular. systolic murmur precordium. no s3s4. edema noted in bilateral LE. GI: soft, non-tender; bowel sounds normal; no masses,  no organomegaly Extremities: edema 1+   Lab Results:  Basic Metabolic Panel:  Recent Labs Lab 01/19/14 0100 01/20/14 0359  NA 142 142  K 4.3 4.1  CL 103 105  CO2 24 26  GLUCOSE 117* 96  BUN 65* 67*  CREATININE 2.20* 2.22*  CALCIUM 9.2 8.8  MG  --  2.4   Liver Function Tests:  Recent Labs Lab 01/19/14 0100  AST 16  ALT 7  ALKPHOS 96  BILITOT 0.7  PROT 7.7  ALBUMIN 3.6  CBC:  Recent Labs Lab 01/19/14 0100 01/20/14 0359  WBC 5.5 3.9*  HGB 9.9* 9.0*  HCT 28.7* 26.5*  MCV 97.3 96.7  PLT 125* 127*   Cardiac Enzymes:  Recent Labs Lab 01/19/14 0552 01/19/14 1134 01/19/14 1752  TROPONINI <0.30 <0.30 <0.30   BNP (last 3 results)  Recent Labs  01/19/14 0100  PROBNP 13095.0*   CBG: No results found for this basename: GLUCAP,  in the last 168 hours  Recent Results (from the past 240 hour(s))  MRSA PCR SCREENING     Status: Abnormal   Collection Time    01/19/14  1:30 PM      Result Value Ref Range Status   MRSA by PCR POSITIVE (*) NEGATIVE Final    Comment:            The GeneXpert MRSA Assay (FDA     approved for NASAL specimens     only), is one component of a     comprehensive MRSA colonization     surveillance program. It is not     intended to diagnose MRSA     infection nor to guide or     monitor treatment for     MRSA infections.     RESULT CALLED TO, READ BACK BY AND VERIFIED WITH:     YATES RN 15:15 01/19/14 ([email or web address])      Studies/Results: Dg Chest 2 View  01/19/2014   CLINICAL DATA:  Chest pain and cough. Bilateral lower extremity pitting edema. No additional acute clinical information provided.  EXAM: CHEST  2 VIEW  COMPARISON:  Chest radiograph March 06, 2012  FINDINGS: The cardiac silhouette appears moderate to severely enlarged, similar to prior imaging tortuous calcified aorta. Central pulmonary vascular congestion and moderate interstitial prominence. Small RIGHT pleural effusion. Patchy RIGHT lower lobe airspace opacity. No pneumothorax.  High-riding humeral heads suggest remote rotator cuff injury. Soft tissue planes are nonsuspicious.  IMPRESSION: Similar cardiomegaly with interstitial edema, small RIGHT pleural effusion. RIGHT lower lobe airspace opacity favors confluent Edema and/or atelectasis.   Electronically Signed   By: Awilda Metroourtnay  Bloomer   On: 01/19/2014 02:33    Medications:  Scheduled: . acetaminophen  500 mg Oral BH-q7a  . amLODipine  10 mg Oral Daily  . aspirin  81 mg Oral Daily  . carvedilol  6.25 mg Oral BID WC  . furosemide  40 mg Intravenous BID  . heparin  5,000 Units Subcutaneous 3 times per day  . hydrALAZINE  50 mg Oral TID  . nystatin   Topical BID  . sodium chloride  3 mL Intravenous Q12H   Continuous:  ZOX:[id number]:sodium chloride, sodium chloride  Assessment/Plan:  Principal Problem:   Bilateral leg edema Active Problems:   Chronic renal insufficiency, stage III (moderate)   Essential hypertension, benign   Moderate aortic stenosis   Moderate mitral regurgitation   Anemia    Thrombocytopenia   History of rheumatic heart disease   Moderate to severe pulmonary hypertension   Severe tricuspid regurgitation   ARF (acute renal failure)    Bilateral leg edema/Acute Diastolic CHF/Severe TR Continue IV Lasix. ECHO report as above. Cardiology following. Strict I/O. Daily weights.  Essential HTN Continue current medications. ACEI on hold due to worsening renal function.   Acute on CKD-III Creatinine is up slightly. Baseline is around 1.2 but from 2014. Could be higher now. Monitor UO.   Anemia and thrombocytopenia Unclear etiology. Hemoglobin stable. No bleeding tendency. No iron deficiency noted.  DVT Prophylaxis: Heparin  Code Status: Full Code  Family Communication: Discussed with patient  Disposition Plan: PT/OT to see.    LOS: 1 day   [facility]  [facility] Pager [phone] 01/20/2014, 3:14 PM  If 8PM-8AM, please contact night-coverage at [email or web address], password [facility]

## 2014-01-20 NOTE — Progress Notes (Signed)
Pt BP is low this evening and MD & PA were contacted to let them know we will be holding the lasix and coreg this evening.  Pt is also still experiencing intermittent bradycardia.

## 2014-01-20 NOTE — Progress Notes (Signed)
OT Cancellation Note  Patient Details Name: Meghan Russo MRN: [id number] DOB: 11/29/1920   Cancelled Treatment:    Reason Eval/Treat Not Completed: Patient at procedure or test/ unavailable - Pt with PT.  Will reattempt as schedule allows.   Angelene GiovanniConarpe, Ashir Kunz M Kayslee Furey Lone Staronarpe, OTR/L [phone]  01/20/2014, 11:20 AM

## 2014-01-20 NOTE — Clinical Documentation Improvement (Signed)
Please clarify "congestion heart failure". Thank you  . Document acuity --Acute --Chronic --Acute on Chronic . Document type --Diastolic --Systolic --Combined systolic and diastolic . Due to or associated with --Cardiac or other surgery --Hypertension --Valvular disease --Rheumatic heart disease Endocarditis (valvitis) Pericarditis Myocarditis --Other (specify)  Supporting Information: History of rheumatic heart disease w/multi-valvular heart disease, CKD III, and hypertension  ED provider note:  Final diagnoses:   Congestive heart failure, unspecified congestive heart failure chronicity, unspecified congestive heart failure type   3:52 AM  Pt with increased BNP as well as cardiomegaly with edema, worsening renal function. Will d/w hospitalist for admission for careful diuresis. +3 Bilateral LE edema  H&P 1. bilateral leg edema: It is most likely caused by congestive heart failure.  Cards consult note: 3. congestive heart failure predominantly right-sided with past history of severe pulmonary hypertension secondary to severe tricuspid regurgitation 10/20 progress note: History of rheumatic heart disease and right-sided heart failure secondary to tricuspid regurg severe causing him severe pulmonary hypertension plus moderate mitral regurg: Likely cause of edema. Cardiology on board, trial of Lasix 10/21 progress note: history of rheumatic heart disease with multi-valvular heart disease and chronic renal insufficiency admitted because of worsening leg edema. Pro BNP on arrival was [phone]. CXR with cardiomegaly w/ small RIGHT pleural.  effusion  10/20 Echo: EF 55-60% 10/20 BNP: 13095 10/20 CXR: IMPRESSION:  Similar cardiomegaly with interstitial edema, small RIGHT pleural  effusion. RIGHT lower lobe airspace opacity favors confluent Edema  and/or atelectasis.  Treatments IV Lasix 40mg  bid Saline locked IV Daily weights Monitoring I&O   Thank You,  Wm Fruchter T.  Luiz OchoaWilliams RN, MSN, MBA/MHA Clinical Documentation Specialist [email or web address] Office # [phone]

## 2014-01-21 DIAGNOSIS — N179 Acute kidney failure, unspecified: Secondary | ICD-10-CM

## 2014-01-21 MED ORDER — ALUM & MAG HYDROXIDE-SIMETH 200-200-20 MG/5ML PO SUSP: 15.0000 mL | Freq: Four times a day (QID) | ORAL | Status: DC | PRN

## 2014-01-21 MED ORDER — FUROSEMIDE 40 MG PO TABS
40.0000 mg | ORAL_TABLET | Freq: Two times a day (BID) | ORAL | Status: DC
  Administered 2014-01-21 – 2014-01-22 (×2): 40 mg via ORAL
  Filled 2014-01-21 (×4): qty 1

## 2014-01-21 NOTE — Progress Notes (Signed)
[facility] PROGRESS NOTE  Meghan Russo ZOX:[id number] DOB: 08/10/1920 DOA: 01/19/2014  PCP: Meghan Russo  Brief HPI: Meghan Russo is a 80 y.o. female with PMH of hypertension, aortic stenosis, mitral valve regurgitation, chronic kidney disease-stage III, who presents with bilateral leg edema. Patient reported that she has bilateral leg edema in the past 2 or 3 months, which has been progressively worsening in the past several weeks.   Past medical history:  Past Medical History  Diagnosis Date  . Hypertension   . Knee pain   . Edema   . Chronic kidney disease, stage III (moderate)   . Benign hypertensive kidney disease with chronic kidney disease stage I through stage IV, or unspecified   . Moderate aortic stenosis     moderate MR, severe TR  . Moderate to severe pulmonary hypertension   . Arthritis   . Cataract     Consultants: Cardiology  Procedures:  2D ECHO Study Conclusions - Left ventricle: The cavity size was normal. Wall thickness was increased in a pattern of severe LVH. Systolic function was normal. The estimated ejection fraction was in the range of 55% to 60%. Wall motion was normal; there were no regional wall motion abnormalities. Doppler parameters are consistent with restrictive physiology, indicative of decreased left ventricular diastolic compliance and/or increased left atrial pressure. - Aortic valve: There was moderate stenosis. There was moderate regurgitation. Valve area (VTI): 1.1 cm^2. Valve area (Vmax): 1.08 cm^2. Valve area (Vmean): 1.21 cm^2. - Mitral valve: Calcified annulus. Mildly thickened leaflets. There was mild regurgitation. - Right ventricle: The cavity size was moderately dilated. - Right atrium: The atrium was severely dilated. - Tricuspid valve: There was severe regurgitation. - Pulmonary arteries: PA peak pressure: 39 mm Hg (S).   Antibiotics: None  Subjective: Patient feels well. Denies shortness of breath. No chest  pain.  Objective: Vital Signs  Filed Vitals:   01/20/14 1551 01/20/14 1754 01/20/14 2003 01/21/14 0500  BP: 125/40 118/39 120/41 136/45  Pulse: 55 73 57 61  Temp:   98.7 F (37.1 C) 98.9 F (37.2 C)  TempSrc:   Oral Oral  Resp: 18 18 19 17   Height:      Weight:    81.4 kg (179 lb 7.3 oz)  SpO2: 96% 98% 98% 99%    Intake/Output Summary (Last 24 hours) at 01/21/14 1249 Last data filed at 01/21/14 0901  Gross per 24 hour  Intake    345 ml  Output      0 ml  Net    345 ml   Filed Weights   01/19/14 0038 01/20/14 0233 01/21/14 0500  Weight: 68.04 kg (150 lb) 81.693 kg (180 lb 1.6 oz) 81.4 kg (179 lb 7.3 oz)    General appearance: alert, cooperative, appears stated age and no distress Resp: improved air entry. no wheezing. Very few crackles Cardio: s1s2 normal regular. systolic murmur precordium. no s3s4. Improved LE edema GI: soft, non-tender; bowel sounds normal; no masses,  no organomegaly Extremities: improved edema   Lab Results:  Basic Metabolic Panel:  Recent Labs Lab 01/19/14 0100 01/20/14 0359  NA 142 142  K 4.3 4.1  CL 103 105  CO2 24 26  GLUCOSE 117* 96  BUN 65* 67*  CREATININE 2.20* 2.22*  CALCIUM 9.2 8.8  MG  --  2.4   Liver Function Tests:  Recent Labs Lab 01/19/14 0100  AST 16  ALT 7  ALKPHOS 96  BILITOT 0.7  PROT 7.7  ALBUMIN 3.6   CBC:  Recent Labs Lab 01/19/14 0100 01/20/14 0359  WBC 5.5 3.9*  HGB 9.9* 9.0*  HCT 28.7* 26.5*  MCV 97.3 96.7  PLT 125* 127*   Cardiac Enzymes:  Recent Labs Lab 01/19/14 0552 01/19/14 1134 01/19/14 1752  TROPONINI <0.30 <0.30 <0.30   BNP (last 3 results)  Recent Labs  01/19/14 0100  PROBNP 13095.0*   CBG: No results found for this basename: GLUCAP,  in the last 168 hours  Recent Results (from the past 240 hour(s))  MRSA PCR SCREENING     Status: Abnormal   Collection Time    01/19/14  1:30 PM      Result Value Ref Range Status   MRSA by PCR POSITIVE (*) NEGATIVE Final    Comment:            The GeneXpert MRSA Assay (FDA     approved for NASAL specimens     only), is one component of a     comprehensive MRSA colonization     surveillance program. It is not     intended to diagnose MRSA     infection nor to guide or     monitor treatment for     MRSA infections.     RESULT CALLED TO, READ BACK BY AND VERIFIED WITH:     Meghan Russo 15:15 01/19/14 (wilsonm)      Studies/Results: No results found.  Medications:  Scheduled: . acetaminophen  500 mg Oral BH-q7a  . amLODipine  10 mg Oral Daily  . aspirin  81 mg Oral Daily  . carvedilol  6.25 mg Oral BID WC  . furosemide  40 mg Oral BID  . heparin  5,000 Units Subcutaneous 3 times per day  . hydrALAZINE  50 mg Oral TID  . nystatin   Topical BID  . sodium chloride  3 mL Intravenous Q12H   Continuous:  ION:GEXBMWPRN:sodium chloride, alum & mag hydroxide-simeth, sodium chloride  Assessment/Plan:  Principal Problem:   Acute diastolic congestive heart failure Active Problems:   Chronic renal insufficiency, stage III (moderate)   Essential hypertension, benign   Moderate aortic stenosis   Moderate mitral regurgitation   Bilateral leg edema   Anemia   Thrombocytopenia   History of rheumatic heart disease   Moderate to severe pulmonary hypertension   Severe tricuspid regurgitation   ARF (acute renal failure)    Bilateral leg edema/Acute Diastolic CHF/Severe TR Patient has improved. Change to oral lasix. ECHO report as above. Cardiology following. Strict I/O. Daily weights. UO not being recorded.  Essential HTN Continue current medications. ACEI on hold due to worsening renal function.   Acute on CKD-III Creatinine was up slightly. Will recheck in AM. Baseline is around 1.2 but from 2014. Could be higher now. Monitor UO. May have to tolerate higher creatinine.  Anemia and Mild thrombocytopenia Unclear etiology. Hemoglobin stable. No bleeding tendency. No iron deficiency noted.  DVT Prophylaxis:  Heparin    Code Status: Full Code  Family Communication: Discussed with patient  Disposition Plan: PT/OT recommends HH. Possible DC in AM.    LOS: 2 days   [facility] Day SurgeryKRISHNAN,Hilde Russo  [facility] Pager [phone] 01/21/2014, 12:49 PM  If 8PM-8AM, please contact night-coverage at [email or web address], password [email or web address]

## 2014-01-21 NOTE — Progress Notes (Signed)
Pt ambulated in hallway with front wheel rolling walker 150 ft with family; no complaints or complications during walk.  Hermina BartersBOWMAN, Arlow Spiers M, RN

## 2014-01-21 NOTE — Progress Notes (Signed)
Occupational Therapy Evaluation Patient Details Name: Meghan Russo MRN: [id number] DOB: 11/02/1920 Today's Date: 01/21/2014    History of Present Illness 80 y.o. female admitted with essential HTN, acute renal failure in the setting of Chronic renal insufficiency, stage III, and Thrombocytopenia.   Clinical Impression   Pt plans to return home with daughter who can provide 24/7 assistance. Pt close to baseline for ADL Pt safe to d/C home but will need RW. Has all other DME. No further OT needed.     Follow Up Recommendations  No OT follow up;Supervision/Assistance - 24 hour    Equipment Recommendations  Other (comment) (RW)    Recommendations for Other Services       Precautions / Restrictions Precautions Precautions: Fall      Mobility Bed Mobility Overal bed mobility: Needs Assistance Bed Mobility: Sit to Supine       Sit to supine: Min assist   General bed mobility comments: to assist legs back onto bed  Transfers Overall transfer level: Needs assistance Equipment used: Rolling walker (2 wheeled) Transfers: Sit to/from UGI CorporationStand;Stand Pivot Transfers Sit to Stand: Supervision Stand pivot transfers: Supervision            Balance Overall balance assessment: Needs assistance   Sitting balance-Leahy Scale: Good     Standing balance support: During functional activity Standing balance-Leahy Scale: Fair                              ADL Overall ADL's : At baseline                                             Vision                     Perception     Praxis      Pertinent Vitals/Pain Pain Assessment: No/denies pain     Hand Dominance Right   Extremity/Trunk Assessment Upper Extremity Assessment Upper Extremity Assessment: Generalized weakness (B shoulder RTC insufficiency at baseline)   Lower Extremity Assessment Lower Extremity Assessment: Generalized weakness   Cervical / Trunk Assessment Cervical /  Trunk Assessment: Kyphotic   Communication Communication Communication: HOH   Cognition Arousal/Alertness: Awake/alert Behavior During Therapy: Flat affect Overall Cognitive Status: Within Functional Limits for tasks assessed                     General Comments       Exercises       Shoulder Instructions      Home Living Family/patient expects to be discharged to:: Private residence Living Arrangements: Other relatives Available Help at Discharge: Family;Other (Comment) Type of Home: House Home Access: Level entry     Home Layout: One level     Bathroom Shower/Tub: Chief Strategy OfficerTub/shower unit   Bathroom Toilet: Standard Bathroom Accessibility: Yes How Accessible: Accessible via walker Home Equipment: Walker - standard;Cane - single point;Bedside commode   Additional Comments: daughter will stay with patient and pt sponge bathes      Prior Functioning/Environment Level of Independence: Needs assistance;Independent with assistive device(s)  Gait / Transfers Assistance Needed: Gilmer MorCane primarily for ambulation, occasional use of standard walker. ADL's / Homemaking Assistance Needed: Rarely cooks, can bath/dress herself        OT Diagnosis: Generalized weakness   OT Problem List:  OT Treatment/Interventions:      OT Goals(Current goals can be found in the care plan section) Acute Rehab OT Goals Patient Stated Goal: go home OT Goal Formulation:  (eval only)  OT Frequency:     Barriers to D/C:            Co-evaluation              End of Session Equipment Utilized During Treatment: Gait belt;Rolling walker Nurse Communication: Mobility status  Activity Tolerance: Patient tolerated treatment well Patient left: in bed;with call bell/phone within reach;with family/visitor present   Time: [id number] OT Time Calculation (min): 18 min Charges:  OT General Charges $OT Visit: 1 Procedure OT Evaluation $Initial OT Evaluation Tier I: 1 Procedure OT  Treatments $Self Care/Home Management : 8-22 mins G-Codes:    Lawrance Wiedemann,HILLARY 01/21/2014, 3:04 PM   [facility] Center LPilary Arpan Eskelson, OTR/L  [phone] 01/21/2014

## 2014-01-22 LAB — BASIC METABOLIC PANEL
ANION GAP: 11 (ref 5–15)
BUN: 60 mg/dL — ABNORMAL HIGH (ref 6–23)
CALCIUM: 8.9 mg/dL (ref 8.4–10.5)
CHLORIDE: 102 meq/L (ref 96–112)
CO2: 26 mEq/L (ref 19–32)
CREATININE: 1.89 mg/dL — AB (ref 0.50–1.10)
GFR calc Af Amer: 26 mL/min — ABNORMAL LOW (ref >90)
GFR calc non Af Amer: 22 mL/min — ABNORMAL LOW (ref >90)
Glucose, Bld: 94 mg/dL (ref 70–99)
Potassium: 3.9 mEq/L (ref 3.7–5.3)
Sodium: 139 mEq/L (ref 137–147)

## 2014-01-22 MED ORDER — FUROSEMIDE 40 MG PO TABS: 40.0000 mg | ORAL_TABLET | Freq: Two times a day (BID) | ORAL | Status: DC

## 2014-01-22 MED ORDER — AMLODIPINE BESYLATE 5 MG PO TABS: 10.0000 mg | ORAL_TABLET | Freq: Every day | ORAL | Status: DC

## 2014-01-22 MED ORDER — AMLODIPINE BESYLATE 5 MG PO TABS: 5.0000 mg | ORAL_TABLET | Freq: Every day | ORAL | Status: DC

## 2014-01-22 NOTE — Care Management Note (Signed)
    Page 1 of 1   01/22/2014     3:45:37 PM CARE MANAGEMENT NOTE 01/22/2014  Patient:  Meghan Russo,Meghan Russo   Account Number:  [id number]  Date Initiated:  01/19/2014  Documentation initiated by:  Elmer BalesOBARGE,COURTNEY  Subjective/Objective Assessment:   Patient was admitted with bilateral lower extremity edema in the setting of right sided heart failure.  Lives at home with grandson.     Action/Plan:   Will follow for discharge needs.   Anticipated DC Date:  01/22/2014   Anticipated DC Plan:  HOME W HOME HEALTH SERVICES      DC Planning Services  CM consult      [facility]   Choice offered to / List presented to:  C-2 HC POA / Guardian        HH arranged  HH-1 RN  HH-2 PT  HH-3 OT      [facility]   Status of service:  Completed, signed off Medicare Important Message given?  YES (If response is "NO", the following Medicare IM given date fields will be blank) Date Medicare IM given:  01/22/2014 Medicare IM given by:  Dustin Bumbaugh Date Additional Medicare IM given:   Additional Medicare IM given by:    Discharge Disposition:  HOME W HOME HEALTH SERVICES  Per UR Regulation:  Reviewed for med. necessity/level of care/duration of stay  If discussed at Long Length of Stay Meetings, dates discussed:    Comments:

## 2014-01-22 NOTE — Discharge Summary (Signed)
[facility]  Physician Discharge Summary   Patient ID: Meghan Russo MRN: [id number] DOB/AGE: 1920/08/25 80 y.o.  Admit date: 01/19/2014 Discharge date: 01/22/2014  PCP: [name], MD  DISCHARGE DIAGNOSES:  Principal Problem:   Acute diastolic congestive heart failure Active Problems:   Chronic renal insufficiency, stage III (moderate)   Essential hypertension, benign   Moderate aortic stenosis   Moderate mitral regurgitation   Bilateral leg edema   Anemia   Thrombocytopenia   History of rheumatic heart disease   Moderate to severe pulmonary hypertension   Severe tricuspid regurgitation   ARF (acute renal failure)   RECOMMENDATIONS FOR OUTPATIENT FOLLOW UP: 1. Bmet next week when she follows up with PCP 2. ACEI has been held due to elevated creatinine.   DISCHARGE CONDITION: fair  Diet recommendation: Heart healthy  Filed Weights   01/20/14 0233 01/21/14 0500 01/22/14 0513  Weight: 81.693 kg (180 lb 1.6 oz) 81.4 kg (179 lb 7.3 oz) 81.3 kg (179 lb 3.7 oz)    INITIAL HISTORY: Meghan Russo is a 80 y.o. female with PMH of hypertension, aortic stenosis, mitral valve regurgitation, severe TR, chronic kidney disease-stage III, who presents with bilateral leg edema. Patient reported that she has bilateral leg edema in the past 2 or 3 months, which has been progressively worsening in the past several weeks.   Consultants: Cardiology   Procedures:  2D ECHO  Study Conclusions - Left ventricle: The cavity size was normal. Wall thickness was increased in a pattern of severe LVH. Systolic function was normal. The estimated ejection fraction was in the range of 55% to 60%. Wall motion was normal; there were no regional wall motion abnormalities. Doppler parameters are consistent with restrictive physiology, indicative of decreased left ventricular diastolic compliance and/or increased left atrial pressure. - Aortic valve: There was moderate stenosis. There was  moderate regurgitation. Valve area (VTI): 1.1 cm^2. Valve area (Vmax): 1.08 cm^2. Valve area (Vmean): 1.21 cm^2. - Mitral valve: Calcified annulus. Mildly thickened leaflets. There was mild regurgitation. - Right ventricle: The cavity size was moderately dilated. - Right atrium: The atrium was severely dilated. - Tricuspid valve: There was severe regurgitation. - Pulmonary arteries: PA peak pressure: 39 mm Hg (S).   HOSPITAL COURSE:   Bilateral leg edema/Acute Diastolic CHF/Severe TR  Patient was started on IV lasix. She was seen by cardiology. She was continued on medical management. Medications were adjusted. ACEI was held due to elevation in creatinine. She does have normal systolic function. She has improved. Her edema is better. She is breathing better. Lasix was changed to oral. ECHO report as above. She is ok for discharge today.  MultiValvular Disease due to Rheumatic Heart Disease.  Medical management.  Essential HTN  ACEI on hold due to worsening renal function. Amlodipine was added.   Acute on CKD-III  Creatinine was up slightly and remains stable. ACEI was held. PCP to recheck renal function next week.    Anemia and Mild thrombocytopenia  Unclear etiology. Hemoglobin stable. No bleeding tendency. No iron deficiency noted.   Overall she is improved. She can be discharged home.   PERTINENT LABS:  The results of significant diagnostics from this hospitalization (including imaging, microbiology, ancillary and laboratory) are listed below for reference.    Microbiology: Recent Results (from the past 240 hour(s))  MRSA PCR SCREENING     Status: Abnormal   Collection Time    01/19/14  1:30 PM      Result Value Ref Range Status  MRSA by PCR POSITIVE (*) NEGATIVE Final   Comment:            The GeneXpert MRSA Assay (FDA     approved for NASAL specimens     only), is one component of a     comprehensive MRSA colonization     surveillance program. It is not     intended  to diagnose MRSA     infection nor to guide or     monitor treatment for     MRSA infections.     RESULT CALLED TO, READ BACK BY AND VERIFIED WITH:     YATES RN 15:15 01/19/14 (wilsonm)     Labs: Basic Metabolic Panel:  Recent Labs Lab 01/19/14 0100 01/20/14 0359 01/22/14 0358  NA 142 142 139  K 4.3 4.1 3.9  CL 103 105 102  CO2 24 26 26   GLUCOSE 117* 96 94  BUN 65* 67* 60*  CREATININE 2.20* 2.22* 1.89*  CALCIUM 9.2 8.8 8.9  MG  --  2.4  --    Liver Function Tests:  Recent Labs Lab 01/19/14 0100  AST 16  ALT 7  ALKPHOS 96  BILITOT 0.7  PROT 7.7  ALBUMIN 3.6   CBC:  Recent Labs Lab 01/19/14 0100 01/20/14 0359  WBC 5.5 3.9*  HGB 9.9* 9.0*  HCT 28.7* 26.5*  MCV 97.3 96.7  PLT 125* 127*   Cardiac Enzymes:  Recent Labs Lab 01/19/14 0552 01/19/14 1134 01/19/14 1752  TROPONINI <0.30 <0.30 <0.30   BNP: BNP (last 3 results)  Recent Labs  01/19/14 0100  PROBNP 13095.0*    IMAGING STUDIES Dg Chest 2 View  01/19/2014   CLINICAL DATA:  Chest pain and cough. Bilateral lower extremity pitting edema. No additional acute clinical information provided.  EXAM: CHEST  2 VIEW  COMPARISON:  Chest radiograph March 06, 2012  FINDINGS: The cardiac silhouette appears moderate to severely enlarged, similar to prior imaging tortuous calcified aorta. Central pulmonary vascular congestion and moderate interstitial prominence. Small RIGHT pleural effusion. Patchy RIGHT lower lobe airspace opacity. No pneumothorax.  High-riding humeral heads suggest remote rotator cuff injury. Soft tissue planes are nonsuspicious.  IMPRESSION: Similar cardiomegaly with interstitial edema, small RIGHT pleural effusion. RIGHT lower lobe airspace opacity favors confluent Edema and/or atelectasis.   Electronically Signed   By: Awilda Metroourtnay  Bloomer   On: 01/19/2014 02:33    DISCHARGE EXAMINATION: Filed Vitals:   01/21/14 1500 01/21/14 2146 01/22/14 0513 01/22/14 0845  BP: 129/43 124/49 118/52  114/43  Pulse: 63 65 62 63  Temp: 97.7 F (36.5 C) 98.4 F (36.9 C) 98.1 F (36.7 C)   TempSrc: Oral Oral Oral   Resp: 17 18 18    Height:      Weight:   81.3 kg (179 lb 3.7 oz)   SpO2: 98% 99% 98% 100%   General appearance: alert, cooperative, appears stated age and no distress Resp: clear to auscultation bilaterally Cardio: regular rate and rhythm, S1, S2 normal, no murmur, click, rub or gallop  DISPOSITION: Home with daughter  Discharge Instructions   (HEART FAILURE PATIENTS) Call MD:  Anytime you have any of the following symptoms: 1) 3 pound weight gain in 24 hours or 5 pounds in 1 week 2) shortness of breath, with or without a dry hacking cough 3) swelling in the hands, feet or stomach 4) if you have to sleep on extra pillows at night in order to breathe.    Complete by:  As directed  Call MD for:  persistant nausea and vomiting    Complete by:  As directed      Call MD for:  severe uncontrolled pain    Complete by:  As directed      Call MD for:  temperature >100.4    Complete by:  As directed      Diet - low sodium heart healthy    Complete by:  As directed      Discharge instructions    Complete by:  As directed   Please watch your weights daily. Call Dr. Allyson SabalBerry or your PCP if you notice leg swelling. You will need blood work next week to check kidney function.     Increase activity slowly    Complete by:  As directed            ALLERGIES:  Allergies  Allergen Reactions  . Asa [Aspirin] Other (See Comments)    GI Upset    Current Discharge Medication List    START taking these medications   Details  amLODipine (NORVASC) 5 MG tablet Take 1 tablet (5 mg total) by mouth daily. Qty: 30 tablet, Refills: 2      CONTINUE these medications which have CHANGED   Details  furosemide (LASIX) 40 MG tablet Take 1 tablet (40 mg total) by mouth 2 (two) times daily. Qty: 60 tablet, Refills: 3      CONTINUE these medications which have NOT CHANGED   Details    acetaminophen (TYLENOL) 500 MG tablet Take 500 mg by mouth every morning.    aspirin 81 MG tablet Take 81 mg by mouth daily.    carvedilol (COREG) 6.25 MG tablet Take 1 tablet (6.25 mg total) by mouth 2 (two) times daily with a meal. Qty: 180 tablet, Refills: 3    hydrALAZINE (APRESOLINE) 50 MG tablet Take 1 tablet (50 mg total) by mouth 3 (three) times daily. Qty: 270 tablet, Refills: 3    nystatin (MYCOSTATIN/NYSTOP) 100000 UNIT/GM POWD Apply 1 gram topically between toes bid Qty: 60 g, Refills: 3      STOP taking these medications     lisinopril-hydrochlorothiazide (PRINZIDE,ZESTORETIC) 20-25 MG per tablet        Follow-up Information   Follow up with Gwynneth AlimentSANDERS,ROBYN N, MD. Schedule an appointment as soon as possible for a visit in 1 week. (post hospitalization follow up and for blood work to check kidneys (BMET))    Specialty:  Internal Medicine   Contact information:   [address] [place] [address] [phone]       Follow up with Runell GessBERRY,JONATHAN J, MD. Schedule an appointment as soon as possible for a visit in 3 weeks. (aortic stenosis and CHF)    Specialty:  Cardiology   Contact information:   [address] [place] [address] [phone]       TOTAL DISCHARGE TIME: 35 mins  [facility] CenterKRISHNAN,Meghan Russo  [facility] Pager [phone]  01/22/2014, 9:08 AM

## 2014-01-22 NOTE — Progress Notes (Signed)
DC IV and tele per MD orders and protocol.  Chauncy Mangiaracina M, RN  

## 2014-01-22 NOTE — Discharge Instructions (Signed)
Aortic Stenosis °Aortic stenosis is a narrowing of the aortic valve. The aortic valve is a gatelike structure that is located between the lower left chamber of the heart (left ventricle) and the blood vessel that leads away from the heart (aorta). When the aortic valve is narrowed, it does not open all the way. This makes it hard for the heart to pump blood into the aorta and causes the heart to work harder. The extra work can weaken the heart over time and lead to heart failure. °CAUSES  °Causes of aortic stenosis include: °· Calcium deposits on the aortic valve that have made the valve stiff. This condition generally affects those over the age of 65. It is the most common cause of aortic stenosis. °· A birth defect.  °· Rheumatic fever. This is a problem that may occur after a strep throat infection that was not treated adequately. Rheumatic fever can cause permanent damage to heart valves. °SIGNS AND SYMPTOMS  °People with aortic stenosis usually have no symptoms until the condition becomes severe. It may take 10-20 years for mild or moderate aortic stenosis to become severe. Symptoms may include:  °· Shortness of breath, especially with physical activity.   °· Feeling weak and tired (fatigued) or getting tired easily. °· Chest discomfort (angina). This may occur with minimal activity if the aortic stenosis is severe.  °· An irregular or faster-than-normal heartbeat. °· Dizziness or fainting that happens with exertion or after taking certain heart medicines (such as nitroglycerin). °DIAGNOSIS  °Aortic stenosis is usually diagnosed with a physical exam and with a type of imaging test called echocardiography. During echocardiography, sound waves are used to evaluate how blood flows through the heart. If your health care provider suspects aortic stenosis but the test does not clearly show it, a procedure called cardiac catheterization may be done to diagnose the condition. Tests may also be done to evaluate heart  function. They may include: °· Electrocardiography. During this test, the electrical impulses of the heart are recorded while you are lying down and sticky patches are placed on your chest, arms, and legs. °· Stress tests. There is more than one type of stress test. If a stress test is needed, ask your health care provider about which type is best for you. °· Blood tests. °TREATMENT  °Treatment depends on how severe the aortic stenosis is, your symptoms, and the problems it is causing.  °· Observation. If the aortic stenosis is mild, no treatment may be needed. However, you will need to have the condition checked regularly to make sure it is not getting worse or causing serious problems. °· Surgery. Surgery to repair or replace the aortic valve is the most common treatment for aortic stenosis. Several types of surgeries are available. The most common are open-heart surgery and transcutaneous aortic valve replacement (TAVR). TAVR does not require that the chest be opened. It is usually performed on elderly patients and those who are not able to have open-heart surgery. °· Medicines. Medicines may be given to keep symptoms from getting worse. Medicines cannot reverse aortic stenosis. °HOME CARE INSTRUCTIONS  °· You may need to avoid certain types of physical activity. If your aortic stenosis is mild, you may need to avoid only strenuous activity. The more severe your aortic stenosis, the more activities you will need to avoid. Talk with your health care provider about the types of activity you should avoid. °· Take medicines only as directed by your health care provider. °· If you are a woman with   aortic valve stenosis and want to get pregnant, talk to your health care provider before you become pregnant. °· If you are a woman with aortic valve stenosis and are pregnant, keep all follow-up visits with all recommended health care providers. °· Keep all follow-up visits for tests, exams, and treatments as directed by  your health care provider. °SEEK IMMEDIATE MEDICAL CARE IF: °· You develop chest pain or tightness.   °· You develop shortness of breath or difficulty breathing.   °· You develop light-headedness or faint.   °· It feels like your heartbeat is irregular or faster than normal. °· You have a fever. °Document Released: 12/16/2002 Document Revised: 08/03/2013 Document Reviewed: 03/13/2012 °[employer]® Patient Information ©2015 [employer]. This information is not intended to replace advice given to you by your health care provider. Make sure you discuss any questions you have with your health care provider. ° ° °Heart Failure °Heart failure is a condition in which the heart has trouble pumping blood. This means your heart does not pump blood efficiently for your body to work well. In some cases of heart failure, fluid may back up into your lungs or you may have swelling (edema) in your lower legs. Heart failure is usually a long-term (chronic) condition. It is important for you to take good care of yourself and follow your health care provider's treatment plan. °CAUSES  °Some health conditions can cause heart failure. Those health conditions include: °· High blood pressure (hypertension). Hypertension causes the heart muscle to work harder than normal. When pressure in the blood vessels is high, the heart needs to pump (contract) with more force in order to circulate blood throughout the body. High blood pressure eventually causes the heart to become stiff and weak. °· Coronary artery disease (CAD). CAD is the buildup of cholesterol and fat (plaque) in the arteries of the heart. The blockage in the arteries deprives the heart muscle of oxygen and blood. This can cause chest pain and may lead to a heart attack. High blood pressure can also contribute to CAD. °· Heart attack (myocardial infarction). A heart attack occurs when one or more arteries in the heart become blocked. The loss of oxygen damages the muscle tissue of  the heart. When this happens, part of the heart muscle dies. The injured tissue does not contract as well and weakens the heart's ability to pump blood. °· Abnormal heart valves. When the heart valves do not open and close properly, it can cause heart failure. This makes the heart muscle pump harder to keep the blood flowing. °· Heart muscle disease (cardiomyopathy or myocarditis). Heart muscle disease is damage to the heart muscle from a variety of causes. These can include drug or alcohol abuse, infections, or unknown reasons. These can increase the risk of heart failure. °· Lung disease. Lung disease makes the heart work harder because the lungs do not work properly. This can cause a strain on the heart, leading it to fail. °· Diabetes. Diabetes increases the risk of heart failure. High blood sugar contributes to high fat (lipid) levels in the blood. Diabetes can also cause slow damage to tiny blood vessels that carry important nutrients to the heart muscle. When the heart does not get enough oxygen and food, it can cause the heart to become weak and stiff. This leads to a heart that does not contract efficiently. °· Other conditions can contribute to heart failure. These include abnormal heart rhythms, thyroid problems, and low blood counts (anemia). °Certain unhealthy behaviors can increase   the risk of heart failure, including: °· Being overweight. °· Smoking or chewing tobacco. °· Eating foods high in fat and cholesterol. °· Abusing illicit drugs or alcohol. °· Lacking physical activity. °SYMPTOMS  °Heart failure symptoms may vary and can be hard to detect. Symptoms may include: °· Shortness of breath with activity, such as climbing stairs. °· Persistent cough. °· Swelling of the feet, ankles, legs, or abdomen. °· Unexplained weight gain. °· Difficulty breathing when lying flat (orthopnea). °· Waking from sleep because of the need to sit up and get more air. °· Rapid heartbeat. °· Fatigue and loss of  energy. °· Feeling light-headed, dizzy, or close to fainting. °· Loss of appetite. °· Nausea. °· Increased urination during the night (nocturia). °DIAGNOSIS  °A diagnosis of heart failure is based on your history, symptoms, physical examination, and diagnostic tests. Diagnostic tests for heart failure may include: °· Echocardiography. °· Electrocardiography. °· Chest X-ray. °· Blood tests. °· Exercise stress test. °· Cardiac angiography. °· Radionuclide scans. °TREATMENT  °Treatment is aimed at managing the symptoms of heart failure. Medicines, behavioral changes, or surgical intervention may be necessary to treat heart failure. °· Medicines to help treat heart failure may include: °¨ Angiotensin-converting enzyme (ACE) inhibitors. This type of medicine blocks the effects of a blood protein called angiotensin-converting enzyme. ACE inhibitors relax (dilate) the blood vessels and help lower blood pressure. °¨ Angiotensin receptor blockers (ARBs). This type of medicine blocks the actions of a blood protein called angiotensin. Angiotensin receptor blockers dilate the blood vessels and help lower blood pressure. °¨ Water pills (diuretics). Diuretics cause the kidneys to remove salt and water from the blood. The extra fluid is removed through urination. This loss of extra fluid lowers the volume of blood the heart pumps. °¨ Beta blockers. These prevent the heart from beating too fast and improve heart muscle strength. °¨ Digitalis. This increases the force of the heartbeat. °· Healthy behavior changes include: °¨ Obtaining and maintaining a healthy weight. °¨ Stopping smoking or chewing tobacco. °¨ Eating heart-healthy foods. °¨ Limiting or avoiding alcohol. °¨ Stopping illicit drug use. °¨ Physical activity as directed by your health care provider. °· Surgical treatment for heart failure may include: °¨ A procedure to open blocked arteries, repair damaged heart valves, or remove damaged heart muscle tissue. °¨ A  pacemaker to improve heart muscle function and control certain abnormal heart rhythms. °¨ An internal cardioverter defibrillator to treat certain serious abnormal heart rhythms. °¨ A left ventricular assist device (LVAD) to assist the pumping ability of the heart. °HOME CARE INSTRUCTIONS  °· Take medicines only as directed by your health care provider. Medicines are important in reducing the workload of your heart, slowing the progression of heart failure, and improving your symptoms. °¨ Do not stop taking your medicine unless directed by your health care provider. °¨ Do not skip any dose of medicine. °¨ Refill your prescriptions before you run out of medicine. Your medicines are needed every day. °· Engage in moderate physical activity if directed by your health care provider. Moderate physical activity can benefit some people. The elderly and people with severe heart failure should consult with a health care provider for physical activity recommendations. °· Eat heart-healthy foods. Food choices should be free of trans fat and low in saturated fat, cholesterol, and salt (sodium). Healthy choices include fresh or frozen fruits and vegetables, fish, lean meats, legumes, fat-free or low-fat dairy products, and whole grain or high fiber foods. Talk to a dietitian to   learn more about heart-healthy foods. °· Limit sodium if directed by your health care provider. Sodium restriction may reduce symptoms of heart failure in some people. Talk to a dietitian to learn more about heart-healthy seasonings. °· Use healthy cooking methods. Healthy cooking methods include roasting, grilling, broiling, baking, poaching, steaming, or stir-frying. Talk to a dietitian to learn more about healthy cooking methods. °· Limit fluids if directed by your health care provider. Fluid restriction may reduce symptoms of heart failure in some people. °· Weigh yourself every day. Daily weights are important in the early recognition of excess fluid.  You should weigh yourself every morning after you urinate and before you eat breakfast. Wear the same amount of clothing each time you weigh yourself. Record your daily weight. Provide your health care provider with your weight record. °· Monitor and record your blood pressure if directed by your health care provider. °· Check your pulse if directed by your health care provider. °· Lose weight if directed by your health care provider. Weight loss may reduce symptoms of heart failure in some people. °· Stop smoking or chewing tobacco. Nicotine makes your heart work harder by causing your blood vessels to constrict. Do not use nicotine gum or patches before talking to your health care provider. °· Keep all follow-up visits as directed by your health care provider. This is important. °· Limit alcohol intake to no more than 1 drink per day for nonpregnant women and 2 drinks per day for men. One drink equals 12 ounces of beer, 5 ounces of wine, or 1½ ounces of hard liquor. Drinking more than that is harmful to your heart. Tell your health care provider if you drink alcohol several times a week. Talk with your health care provider about whether alcohol is safe for you. If your heart has already been damaged by alcohol or you have severe heart failure, drinking alcohol should be stopped completely. °· Stop illicit drug use. °· Stay up-to-date with immunizations. It is especially important to prevent respiratory infections through current pneumococcal and influenza immunizations. °· Manage other health conditions such as hypertension, diabetes, thyroid disease, or abnormal heart rhythms as directed by your health care provider. °· Learn to manage stress. °· Plan rest periods when fatigued. °· Learn strategies to manage high temperatures. If the weather is extremely hot: °¨ Avoid vigorous physical activity. °¨ Use air conditioning or fans or seek a cooler location. °¨ Avoid caffeine and alcohol. °¨ Wear loose-fitting,  lightweight, and light-colored clothing. °· Learn strategies to manage cold temperatures. If the weather is extremely cold: °¨ Avoid vigorous physical activity. °¨ Layer clothes. °¨ Wear mittens or gloves, a hat, and a scarf when going outside. °¨ Avoid alcohol. °· Obtain ongoing education and support as needed. °· Participate in or seek rehabilitation as needed to maintain or improve independence and quality of life. °SEEK MEDICAL CARE IF:  °· Your weight increases by 03 lb/1.4 kg in 1 day or 05 lb/2.3 kg in a week. °· You have increasing shortness of breath that is unusual for you. °· You are unable to participate in your usual physical activities. °· You tire easily. °· You cough more than normal, especially with physical activity. °· You have any or more swelling in areas such as your hands, feet, ankles, or abdomen. °· You are unable to sleep because it is hard to breathe. °· You feel like your heart is beating fast (palpitations). °· You become dizzy or light-headed upon standing up. °SEEK IMMEDIATE   MEDICAL CARE IF:  °· You have difficulty breathing. °· There is a change in mental status such as decreased alertness or difficulty with concentration. °· You have a pain or discomfort in your chest. °· You have an episode of fainting (syncope). °MAKE SURE YOU:  °· Understand these instructions. °· Will watch your condition. °· Will get help right away if you are not doing well or get worse. °Document Released: 03/19/2005 Document Revised: 08/03/2013 Document Reviewed: 04/18/2012 °[employer]® Patient Information ©2015 [employer]. This information is not intended to replace advice given to you by your health care provider. Make sure you discuss any questions you have with your health care provider. ° °

## 2014-02-05 ENCOUNTER — Encounter: Payer: Self-pay | Admitting: *Deleted

## 2014-02-10 ENCOUNTER — Encounter: Payer: Self-pay | Admitting: Physician Assistant

## 2014-02-10 ENCOUNTER — Ambulatory Visit (INDEPENDENT_AMBULATORY_CARE_PROVIDER_SITE_OTHER): Payer: Medicare Other | Admitting: Physician Assistant

## 2014-02-10 VITALS — BP 140/52 | HR 54 | Ht 66.0 in | Wt 183.6 lb

## 2014-02-10 DIAGNOSIS — N183 Chronic kidney disease, stage 3 unspecified: Secondary | ICD-10-CM

## 2014-02-10 DIAGNOSIS — R600 Localized edema: Secondary | ICD-10-CM

## 2014-02-10 DIAGNOSIS — I071 Rheumatic tricuspid insufficiency: Secondary | ICD-10-CM

## 2014-02-10 DIAGNOSIS — I340 Nonrheumatic mitral (valve) insufficiency: Secondary | ICD-10-CM

## 2014-02-10 DIAGNOSIS — I272 Pulmonary hypertension, unspecified: Secondary | ICD-10-CM

## 2014-02-10 DIAGNOSIS — I350 Nonrheumatic aortic (valve) stenosis: Secondary | ICD-10-CM

## 2014-02-10 DIAGNOSIS — I5033 Acute on chronic diastolic (congestive) heart failure: Secondary | ICD-10-CM

## 2014-02-10 DIAGNOSIS — Z79899 Other long term (current) drug therapy: Secondary | ICD-10-CM

## 2014-02-10 DIAGNOSIS — N189 Chronic kidney disease, unspecified: Secondary | ICD-10-CM

## 2014-02-10 DIAGNOSIS — I270 Primary pulmonary hypertension: Secondary | ICD-10-CM

## 2014-02-10 MED ORDER — AMLODIPINE BESYLATE 5 MG PO TABS
5.0000 mg | ORAL_TABLET | Freq: Every day | ORAL | Status: DC
Start: 2014-02-10 — End: 2014-03-22

## 2014-02-10 NOTE — Progress Notes (Signed)
Date:  02/10/2014   ID:  Meghan ChanceJanie H Russo, DOB 09/05/1920, MRN [id number]  PCP:  Gwynneth AlimentSANDERS,ROBYN N, MD  Primary Cardiologist:  Allyson SabalBerry    History of Present Illness: Meghan Russo is a 80 y.o.  thin and frail-appearing widowed [place] female, mother of 3, grandmother to 2 grandchildren.  She has a history of labile hypertension, negative renal Dopplers and valvular heart disease with mild AI, mild to moderate AS, moderate MR and severe TR with severe pulmonary hypertension.  Her last echo performed Jan 19, 2014 [facility] with LE edema.  EF 55-60%, Sev LVH, normal wall motion, diastolic dysfunction.  She was diuresed with IV lasix, change to PO and discharged home.  She presents posthospital evaluation.her weight is up about 4 pounds. She is complaining of lower extremity edema which she does not think is any worse than it was when she was discharged. She sleeps on 2 pillows.  She currently denies nausea, vomiting, fever, chest pain, shortness of breath, dizziness, PND, cough, congestion, abdominal pain, hematochezia, melena, claudication.  2D ECHO  Study Conclusions - Left ventricle: The cavity size was normal. Wall thickness was increased in a pattern of severe LVH. Systolic function was normal. The estimated ejection fraction was in the range of 55% to 60%. Wall motion was normal; there were no regional wall motion abnormalities. Doppler parameters are consistent with restrictive physiology, indicative of decreased left ventricular diastolic compliance and/or increased left atrial pressure. - Aortic valve: There was moderate stenosis. There was moderate regurgitation. Valve area (VTI): 1.1 cm^2. Valve area (Vmax): 1.08 cm^2. Valve area (Vmean): 1.21 cm^2. - Mitral valve: Calcified annulus. Mildly thickened leaflets. There was mild regurgitation. - Right ventricle: The cavity size was moderately dilated. - Right atrium: The atrium was severely dilated. - Tricuspid  valve: There was severe regurgitation. - Pulmonary arteries: PA peak pressure: 39 mm Hg (S).   Wt Readings from Last 3 Encounters:  02/10/14 183 lb 9.6 oz (83.28 kg)  01/22/14 179 lb 3.7 oz (81.3 kg)  12/17/13 172 lb (78.019 kg)     Past Medical History  Diagnosis Date  . Hypertension   . Knee pain   . Edema   . Chronic kidney disease, stage III (moderate)   . Benign hypertensive kidney disease with chronic kidney disease stage I through stage IV, or unspecified   . Moderate aortic stenosis     moderate MR, severe TR  . Moderate to severe pulmonary hypertension   . Arthritis   . Cataract   . Hx of echocardiogram 09/03/2011    EF 55%, the left atrium is moderately dilated, there is moderate concentric left ventricular hypertrophy, left ventricular systolic function is normal.  . History of stress test 01/2001    Nagative adequate Bruce protocol exercise stress test with scintigrahic evidence of breast attenuation artifact with the possibility of mild anteroapical ischemia and an EF of 61% calculated by QGS.    Current Outpatient Prescriptions  Medication Sig Dispense Refill  . acetaminophen (TYLENOL) 500 MG tablet Take 500 mg by mouth every morning.    Marland Kitchen. amLODipine (NORVASC) 5 MG tablet Take 1 tablet (5 mg total) by mouth daily. 30 tablet 6  . aspirin 81 MG tablet Take 81 mg by mouth daily.    . carvedilol (COREG) 6.25 MG tablet Take 1 tablet (6.25 mg total) by mouth 2 (two) times daily with a meal. 180 tablet 3  . furosemide (LASIX) 40 MG tablet Take  1 tablet (40 mg total) by mouth 2 (two) times daily. 60 tablet 3  . hydrALAZINE (APRESOLINE) 50 MG tablet Take 1 tablet (50 mg total) by mouth 3 (three) times daily. 270 tablet 3  . nystatin (MYCOSTATIN/NYSTOP) 100000 UNIT/GM POWD Apply 1 gram topically between toes bid 60 g 3   No current facility-administered medications for this visit.    Allergies:    Allergies  Allergen Reactions  . Asa [Aspirin] Other (See Comments)     GI Upset    Social History:  The patient  reports that she has never smoked. She has never used smokeless tobacco. She reports that she does not drink alcohol or use illicit drugs.   Family history:  No family history on file.  ROS:  Please see the history of present illness.  All other systems reviewed and negative.   PHYSICAL EXAM: VS:  BP 140/52 mmHg  Pulse 54  Ht 5\' 6"  (1.676 m)  Wt 183 lb 9.6 oz (83.28 kg)  BMI 29.65 kg/m2 Well nourished, well developed, in no acute distress HEENT: Pupils are equal round react to light accommodation extraocular movements are intact.  Neck: + JVDNo cervical lymphadenopathy. Cardiac: Regular rate and rhythm with 2/6 systolic murmur. Lungs:  clear to auscultation bilaterally, no wheezing, rhonchi or rales Ext: no lower extremity edema.  2+ radial and dorsalis pedis pulses. Skin: warm and dry Neuro:  Grossly normal   ASSESSMENT AND PLAN:  Problem List Items Addressed This Visit    Acute on chronic diastolic congestive heart failure, NYHA class 3    Patient does have some rather tense lower extremity edema. Other than that she does not appear acutely symptomatic.  It is hard to tell if it is actually chronic due to diastolic heart failure, severe pulmonary hypertension and tricuspid regurgitation. I have increased her Lasix for the next 3 days from 40 twice a day to 80 in the morning and 40 at night.  I will also check a basic metabolic panel. Does not appear she was discharged on potassium so I am concerned it  will be low.      Relevant Medications      amLODIpine (NORVASC) tablet   Bilateral leg edema   Chronic renal insufficiency, stage III (moderate) (Chronic)   Moderate aortic stenosis (Chronic)   Relevant Medications      amLODIpine (NORVASC) tablet   Moderate mitral regurgitation (Chronic)   Relevant Medications      amLODIpine (NORVASC) tablet   Moderate to severe pulmonary hypertension (Chronic)   Relevant Medications       amLODIpine (NORVASC) tablet   Severe tricuspid regurgitation (Chronic)   Relevant Medications      amLODIpine (NORVASC) tablet    Other Visit Diagnoses    Encounter for long-term (current) use of medications    -  Primary    Relevant Orders       Basic Metabolic Panel (BMET)

## 2014-02-10 NOTE — Assessment & Plan Note (Signed)
Patient does have some rather tense lower extremity edema. Other than that she does not appear acutely symptomatic.  It is hard to tell if it is actually chronic due to diastolic heart failure, severe pulmonary hypertension and tricuspid regurgitation. I have increased her Lasix for the next 3 days from 40 twice a day to 80 in the morning and 40 at night.  I will also check a basic metabolic panel. Does not appear she was discharged on potassium so I am concerned it  will be low.

## 2014-02-10 NOTE — Patient Instructions (Addendum)
1.  Take 80 mg in the morning and 40 mg in the evening for three days, then go back to 40 and 40.  Weigh daily  2.  Monitor your weight every morning.  If you gain 3 pounds in 24 hours, or 5 pounds in a week, call the office for instructions regarding extra lasix again.  3.  [facility]al[facility]   4.  Your physician recommends that you return for lab work BMP

## 2014-02-11 LAB — BASIC METABOLIC PANEL
BUN: 63 mg/dL — ABNORMAL HIGH (ref 6–23)
CALCIUM: 9.5 mg/dL (ref 8.4–10.5)
CO2: 24 mEq/L (ref 19–32)
CREATININE: 2.01 mg/dL — AB (ref 0.50–1.10)
Chloride: 103 mEq/L (ref 96–112)
Glucose, Bld: 97 mg/dL (ref 70–99)
Potassium: 4.8 mEq/L (ref 3.5–5.3)
Sodium: 140 mEq/L (ref 135–145)

## 2014-03-16 ENCOUNTER — Inpatient Hospital Stay (HOSPITAL_COMMUNITY)
Admission: AD | Admit: 2014-03-16 | Discharge: 2014-03-22 | DRG: 293 | Disposition: A | Discharge status: Home Health | Payer: Medicare Other | Source: Ambulatory Visit | Attending: Cardiovascular Disease | Admitting: Cardiovascular Disease

## 2014-03-16 ENCOUNTER — Encounter: Payer: Self-pay | Admitting: Physician Assistant

## 2014-03-16 ENCOUNTER — Ambulatory Visit (INDEPENDENT_AMBULATORY_CARE_PROVIDER_SITE_OTHER): Payer: Medicare Other | Admitting: Physician Assistant

## 2014-03-16 ENCOUNTER — Encounter (HOSPITAL_COMMUNITY): Payer: Self-pay | Admitting: *Deleted

## 2014-03-16 VITALS — BP 134/50 | HR 59 | Ht 65.0 in | Wt 187.9 lb

## 2014-03-16 DIAGNOSIS — I071 Rheumatic tricuspid insufficiency: Secondary | ICD-10-CM | POA: Diagnosis present

## 2014-03-16 DIAGNOSIS — Z79899 Other long term (current) drug therapy: Secondary | ICD-10-CM | POA: Diagnosis not present

## 2014-03-16 DIAGNOSIS — N183 Chronic kidney disease, stage 3 unspecified: Secondary | ICD-10-CM

## 2014-03-16 DIAGNOSIS — Z7982 Long term (current) use of aspirin: Secondary | ICD-10-CM

## 2014-03-16 DIAGNOSIS — D696 Thrombocytopenia, unspecified: Secondary | ICD-10-CM | POA: Diagnosis present

## 2014-03-16 DIAGNOSIS — I270 Primary pulmonary hypertension: Secondary | ICD-10-CM

## 2014-03-16 DIAGNOSIS — D649 Anemia, unspecified: Secondary | ICD-10-CM | POA: Diagnosis present

## 2014-03-16 DIAGNOSIS — I340 Nonrheumatic mitral (valve) insufficiency: Secondary | ICD-10-CM | POA: Diagnosis present

## 2014-03-16 DIAGNOSIS — I272 Other secondary pulmonary hypertension: Secondary | ICD-10-CM | POA: Diagnosis present

## 2014-03-16 DIAGNOSIS — M1990 Unspecified osteoarthritis, unspecified site: Secondary | ICD-10-CM | POA: Diagnosis present

## 2014-03-16 DIAGNOSIS — I441 Atrioventricular block, second degree: Secondary | ICD-10-CM | POA: Diagnosis present

## 2014-03-16 DIAGNOSIS — I10 Essential (primary) hypertension: Secondary | ICD-10-CM

## 2014-03-16 DIAGNOSIS — E876 Hypokalemia: Secondary | ICD-10-CM | POA: Diagnosis present

## 2014-03-16 DIAGNOSIS — I509 Heart failure, unspecified: Secondary | ICD-10-CM

## 2014-03-16 DIAGNOSIS — I350 Nonrheumatic aortic (valve) stenosis: Secondary | ICD-10-CM | POA: Diagnosis present

## 2014-03-16 DIAGNOSIS — R609 Edema, unspecified: Secondary | ICD-10-CM | POA: Diagnosis present

## 2014-03-16 DIAGNOSIS — I129 Hypertensive chronic kidney disease with stage 1 through stage 4 chronic kidney disease, or unspecified chronic kidney disease: Secondary | ICD-10-CM | POA: Diagnosis present

## 2014-03-16 DIAGNOSIS — I5033 Acute on chronic diastolic (congestive) heart failure: Secondary | ICD-10-CM | POA: Diagnosis present

## 2014-03-16 DIAGNOSIS — R001 Bradycardia, unspecified: Secondary | ICD-10-CM | POA: Diagnosis present

## 2014-03-16 DIAGNOSIS — N189 Chronic kidney disease, unspecified: Secondary | ICD-10-CM

## 2014-03-16 LAB — COMPREHENSIVE METABOLIC PANEL
ALT: 6 U/L (ref 0–35)
AST: 14 U/L (ref 0–37)
Albumin: 3.2 g/dL — ABNORMAL LOW (ref 3.5–5.2)
Alkaline Phosphatase: 72 U/L (ref 39–117)
Anion gap: 15 (ref 5–15)
BUN: 56 mg/dL — ABNORMAL HIGH (ref 6–23)
CHLORIDE: 102 meq/L (ref 96–112)
CO2: 23 mEq/L (ref 19–32)
Calcium: 8.8 mg/dL (ref 8.4–10.5)
Creatinine, Ser: 2.06 mg/dL — ABNORMAL HIGH (ref 0.50–1.10)
GFR calc non Af Amer: 20 mL/min — ABNORMAL LOW (ref >90)
GFR, EST AFRICAN AMERICAN: 23 mL/min — AB (ref >90)
GLUCOSE: 144 mg/dL — AB (ref 70–99)
POTASSIUM: 3.8 meq/L (ref 3.7–5.3)
SODIUM: 140 meq/L (ref 137–147)
TOTAL PROTEIN: 7.3 g/dL (ref 6.0–8.3)
Total Bilirubin: 0.8 mg/dL (ref 0.3–1.2)

## 2014-03-16 LAB — CBC WITH DIFFERENTIAL/PLATELET
BASOS PCT: 1 % (ref 0–1)
Basophils Absolute: 0 10*3/uL (ref 0.0–0.1)
EOS ABS: 0.6 10*3/uL (ref 0.0–0.7)
Eosinophils Relative: 13 % — ABNORMAL HIGH (ref 0–5)
HEMATOCRIT: 28.6 % — AB (ref 36.0–46.0)
HEMOGLOBIN: 9.7 g/dL — AB (ref 12.0–15.0)
Lymphocytes Relative: 22 % (ref 12–46)
Lymphs Abs: 1.1 10*3/uL (ref 0.7–4.0)
MCH: 32.3 pg (ref 26.0–34.0)
MCHC: 33.9 g/dL (ref 30.0–36.0)
MCV: 95.3 fL (ref 78.0–100.0)
MONO ABS: 0.6 10*3/uL (ref 0.1–1.0)
Monocytes Relative: 12 % (ref 3–12)
NEUTROS PCT: 52 % (ref 43–77)
Neutro Abs: 2.6 10*3/uL (ref 1.7–7.7)
Platelets: 141 10*3/uL — ABNORMAL LOW (ref 150–400)
RBC: 3 MIL/uL — ABNORMAL LOW (ref 3.87–5.11)
RDW: 15.7 % — ABNORMAL HIGH (ref 11.5–15.5)
WBC: 4.9 10*3/uL (ref 4.0–10.5)

## 2014-03-16 LAB — PRO B NATRIURETIC PEPTIDE: Pro B Natriuretic peptide (BNP): 19829 pg/mL — ABNORMAL HIGH (ref 0–450)

## 2014-03-16 LAB — MRSA PCR SCREENING: MRSA BY PCR: POSITIVE — AB

## 2014-03-16 MED ORDER — MUPIROCIN 2 % EX OINT
1.0000 "application " | TOPICAL_OINTMENT | Freq: Two times a day (BID) | CUTANEOUS | Status: AC
  Administered 2014-03-16 – 2014-03-21 (×10): 1 via NASAL
  Filled 2014-03-16 (×3): qty 22

## 2014-03-16 MED ORDER — SODIUM CHLORIDE 0.9 % IV SOLN: 250.0000 mL | INTRAVENOUS | Status: DC | PRN

## 2014-03-16 MED ORDER — SODIUM CHLORIDE 0.9 % IJ SOLN
3.0000 mL | Freq: Two times a day (BID) | INTRAMUSCULAR | Status: DC
  Administered 2014-03-16 – 2014-03-22 (×12): 3 mL via INTRAVENOUS

## 2014-03-16 MED ORDER — HYDRALAZINE HCL 50 MG PO TABS
50.0000 mg | ORAL_TABLET | Freq: Three times a day (TID) | ORAL | Status: DC
  Administered 2014-03-16 – 2014-03-22 (×17): 50 mg via ORAL
  Filled 2014-03-16 (×7): qty 1
  Filled 2014-03-16 (×2): qty 2
  Filled 2014-03-16 (×5): qty 1
  Filled 2014-03-16: qty 2
  Filled 2014-03-16 (×3): qty 1

## 2014-03-16 MED ORDER — HEPARIN SODIUM (PORCINE) 5000 UNIT/ML IJ SOLN
5000.0000 [IU] | Freq: Three times a day (TID) | INTRAMUSCULAR | Status: DC
  Administered 2014-03-16 – 2014-03-22 (×18): 5000 [IU] via SUBCUTANEOUS
  Filled 2014-03-16 (×18): qty 1

## 2014-03-16 MED ORDER — POTASSIUM CHLORIDE CRYS ER 20 MEQ PO TBCR
20.0000 meq | EXTENDED_RELEASE_TABLET | Freq: Every day | ORAL | Status: DC
  Administered 2014-03-16 – 2014-03-18 (×3): 20 meq via ORAL
  Filled 2014-03-16 (×2): qty 1
  Filled 2014-03-16: qty 2

## 2014-03-16 MED ORDER — CHLORHEXIDINE GLUCONATE CLOTH 2 % EX PADS
6.0000 | MEDICATED_PAD | Freq: Every day | CUTANEOUS | Status: AC
  Administered 2014-03-17 – 2014-03-21 (×5): 6 via TOPICAL

## 2014-03-16 MED ORDER — CARVEDILOL 6.25 MG PO TABS
6.2500 mg | ORAL_TABLET | Freq: Two times a day (BID) | ORAL | Status: DC
  Administered 2014-03-16 – 2014-03-17 (×2): 6.25 mg via ORAL
  Filled 2014-03-16 (×2): qty 1

## 2014-03-16 MED ORDER — ONDANSETRON HCL 4 MG/2ML IJ SOLN: 4.0000 mg | Freq: Four times a day (QID) | INTRAMUSCULAR | Status: DC | PRN

## 2014-03-16 MED ORDER — FUROSEMIDE 10 MG/ML IJ SOLN: 40.0000 mg | Freq: Two times a day (BID) | INTRAMUSCULAR | Status: DC

## 2014-03-16 MED ORDER — SODIUM CHLORIDE 0.9 % IJ SOLN: 3.0000 mL | INTRAMUSCULAR | Status: DC | PRN

## 2014-03-16 MED ORDER — FUROSEMIDE 10 MG/ML IJ SOLN
40.0000 mg | Freq: Two times a day (BID) | INTRAMUSCULAR | Status: DC
  Administered 2014-03-16 – 2014-03-17 (×2): 40 mg via INTRAVENOUS
  Filled 2014-03-16 (×2): qty 4

## 2014-03-16 MED ORDER — ACETAMINOPHEN 325 MG PO TABS: 650.0000 mg | ORAL_TABLET | ORAL | Status: DC | PRN

## 2014-03-16 MED ORDER — ASPIRIN EC 81 MG PO TBEC
81.0000 mg | DELAYED_RELEASE_TABLET | Freq: Every day | ORAL | Status: DC
  Administered 2014-03-17 – 2014-03-22 (×6): 81 mg via ORAL
  Filled 2014-03-16 (×8): qty 1

## 2014-03-16 NOTE — Assessment & Plan Note (Signed)
This is definitely contributing to her volume overload.

## 2014-03-16 NOTE — Assessment & Plan Note (Signed)
Baseline creatinine appears to be 1.8 to 2.0.

## 2014-03-16 NOTE — Assessment & Plan Note (Addendum)
This is secondary to severe TR, pulm HTN, mod MR.  She is ~30# over her dry weight.  She will be admitted for IV diuresis.  Her lungs sound clear but I will Check a CXR.  Monitor SCr., which I suspect will improve once we start the diuretic, and potassium.   I suggest we add metolazone at discharge.

## 2014-03-16 NOTE — Progress Notes (Signed)
Patient ID: Meghan Russo, female   DOB: 12/15/1920, 80 y.o.   MRN: [id number]    Date:  03/16/2014   ID:  Meghan Russo, DOB 07/29/1920, MRN [id number]  PCP:  Gwynneth AlimentSANDERS,ROBYN N, MD  Primary Cardiologist:  Allyson SabalBerry     History of Present Illness: Meghan Russo is a 80 y.o. thin and frail-appearing widowed [place] female, mother of 3, grandmother to 2 grandchildren. She has a history of labile hypertension, negative renal Dopplers and valvular heart disease with mild AI, mild to moderate AS, moderate MR and severe TR with severe pulmonary hypertension. Her last echo performed Jan 19, 2014 after being admitted to [facility] with LE edema. EF 55-60%, Sev LVH, normal wall motion, diastolic dysfunction. She was diuresed with IV lasix, change to PO and discharged home.  I saw the patient on November 11 after her hospitalization. Had increased her Lasix for 3 days to 80 in the morning and 40 at night.  Serum creatinine was 2.01 and prior was 1.89 in October.  It appears this may be her baseline range.  She presents today for follow-up.  Her weight has gone up another 4 pounds.  After reviewing her previous discharge in October her weight appears to be approximately 30 pounds. She sleeps with 2 pillows. She reports her legs and abdomen are swollen. She does report good urinary output.   01/19/2014, 2D ECHO   Study Conclusions - Left ventricle: The cavity size was normal. Wall thickness was increased in a pattern of severe LVH. Systolic function was normal. The estimated ejection fraction was in the range of 55% to 60%. Wall motion was normal; there were no regional wall motion abnormalities. Doppler parameters are consistent with restrictive physiology, indicative of decreased left ventricular diastolic compliance and/or increased left atrial pressure. - Aortic valve: There was moderate stenosis. There was moderate regurgitation. Valve area (VTI): 1.1 cm^2. Valve area (Vmax): 1.08 cm^2. Valve area  (Vmean): 1.21 cm^2. - Mitral valve: Calcified annulus. Mildly thickened leaflets. There was mild regurgitation. - Right ventricle: The cavity size was moderately dilated. - Right atrium: The atrium was severely dilated. - Tricuspid valve: There was severe regurgitation. - Pulmonary arteries: PA peak pressure: 39 mm Hg (S).  The patient currently denies nausea, vomiting, fever, chest pain, shortness of breath, orthopnea, dizziness, PND, cough, congestion, abdominal pain, hematochezia, melena, lower extremity edema, claudication.  Wt Readings from Last 3 Encounters:  03/16/14 187 lb 14.4 oz (85.231 kg)  02/10/14 183 lb 9.6 oz (83.28 kg)  01/22/14 179 lb 3.7 oz (81.3 kg)     Past Medical History  Diagnosis Date  . Hypertension   . Knee pain   . Edema   . Chronic kidney disease, stage III (moderate)   . Benign hypertensive kidney disease with chronic kidney disease stage I through stage IV, or unspecified   . Moderate aortic stenosis     moderate MR, severe TR  . Moderate to severe pulmonary hypertension   . Arthritis   . Cataract   . Hx of echocardiogram 09/03/2011    EF 55%, the left atrium is moderately dilated, there is moderate concentric left ventricular hypertrophy, left ventricular systolic function is normal.  . History of stress test 01/2001    Nagative adequate Bruce protocol exercise stress test with scintigrahic evidence of breast attenuation artifact with the possibility of mild anteroapical ischemia and an EF of 61% calculated by QGS.    Current Outpatient Prescriptions  Medication Sig Dispense Refill  .  acetaminophen (TYLENOL) 500 MG tablet Take 500 mg by mouth every morning.    Marland Kitchen. amLODipine (NORVASC) 5 MG tablet Take 1 tablet (5 mg total) by mouth daily. 30 tablet 6  . aspirin 81 MG tablet Take 81 mg by mouth daily.    . carvedilol (COREG) 6.25 MG tablet Take 1 tablet (6.25 mg total) by mouth 2 (two) times daily with a meal. 180 tablet 3  . furosemide (LASIX) 40 MG  tablet Take 1 tablet (40 mg total) by mouth 2 (two) times daily. (Patient taking differently: Take 80 mg by mouth 2 (two) times daily. ) 60 tablet 3  . hydrALAZINE (APRESOLINE) 50 MG tablet Take 1 tablet (50 mg total) by mouth 3 (three) times daily. 270 tablet 3  . nystatin (MYCOSTATIN/NYSTOP) 100000 UNIT/GM POWD Apply 1 gram topically between toes bid 60 g 3   No current facility-administered medications for this visit.    Allergies:    Allergies  Allergen Reactions  . Asa [Aspirin] Other (See Comments)    GI Upset    Social History:  The patient  reports that she has never smoked. She has never used smokeless tobacco. She reports that she does not drink alcohol or use illicit drugs.   Family history:  No family history on file.  ROS:  Please see the history of present illness.  All other systems reviewed and negative.   PHYSICAL EXAM: VS:  BP 134/50 mmHg  Pulse 59  Ht 5\' 5"  (1.651 m)  Wt 187 lb 14.4 oz (85.231 kg)  BMI 31.27 kg/m2 Well nourished, well developed, in no acute distress HEENT: Pupils are equal round react to light accommodation extraocular movements are intact.  Neck: no JVDNo cervical lymphadenopathy. Cardiac: Regular rate and rhythm a 3/6 systolic murmur Lungs:  clear to auscultation bilaterally, no wheezing, rhonchi or rales Abd: Positive bowel sounds. Distended.  Ext: 3+ lower extremity edema up to her lower back.  2+ radial  pulses. Skin: warm and dry Neuro:  Grossly normal  EKG: Sinus bradycardia 59 no clear P waves  ASSESSMENT AND PLAN:  Problem List Items Addressed This Visit    Acute on chronic diastolic congestive heart failure, NYHA class 3 - Primary    This is secondary to severe TR, pulm HTN, mod MR.  She is ~30# over her dry weight.  She will be admitted for IV diuresis.  Her lungs sound clear but I will Check a CXR.  Monitor SCr., which I suspect will improve once we start the diuretic, and potassium.   I suggest we add metolazone at discharge.        Chronic renal insufficiency, stage III (moderate) (Chronic)    Baseline creatinine appears to be 1.8 to 2.0.    Essential hypertension, benign (Chronic)    Just mildly elevated today. Diastolic is on the low side. We'll continue to monitor as we give her the Lasix.    Moderate mitral regurgitation (Chronic)   Moderate to severe pulmonary hypertension (Chronic)   Severe tricuspid regurgitation (Chronic)    This is definitely contributing to her volume overload.

## 2014-03-16 NOTE — H&P (Signed)
Date:  03/16/2014   ID:  Meghan Russo, DOB 12/29/1920, MRN [id number]  PCP:  Gwynneth AlimentSANDERS,ROBYN N, MD  Primary Cardiologist:  Allyson SabalBerry     History of Present Illness: Meghan Russo is a 80 y.o. thin and frail-appearing widowed [place] female, mother of 3, grandmother to 2 grandchildren. She has a history of labile hypertension, negative renal Dopplers and valvular heart disease with mild AI, mild to moderate AS, moderate MR and severe TR with severe pulmonary hypertension. Her last echo performed Jan 19, 2014 after being [facility] with LE edema. EF 55-60%, Sev LVH, normal wall motion, diastolic dysfunction. She was diuresed with IV lasix, change to PO and discharged home.  I saw the patient on November 11 after her hospitalization. Had increased her Lasix for 3 days to 80 in the morning and 40 at night.  Serum creatinine was 2.01 and prior was 1.89 in October.  It appears this may be her baseline range.  She presents today for follow-up.  Her weight has gone up another 4 pounds.  After reviewing her previous discharge in October her weight appears to be approximately 30 pounds. She sleeps with 2 pillows. She reports her legs and abdomen are swollen. She does report good urinary output.   01/19/2014, 2D ECHO   Study Conclusions - Left ventricle: The cavity size was normal. Wall thickness was increased in a pattern of severe LVH. Systolic function was normal. The estimated ejection fraction was in the range of 55% to 60%. Wall motion was normal; there were no regional wall motion abnormalities. Doppler parameters are consistent with restrictive physiology, indicative of decreased left ventricular diastolic compliance and/or increased left atrial pressure. - Aortic valve: There was moderate stenosis. There was moderate regurgitation. Valve area (VTI): 1.1 cm^2. Valve area (Vmax): 1.08 cm^2. Valve area (Vmean): 1.21 cm^2. - Mitral valve: Calcified annulus. Mildly thickened  leaflets. There was mild regurgitation. - Right ventricle: The cavity size was moderately dilated. - Right atrium: The atrium was severely dilated. - Tricuspid valve: There was severe regurgitation. - Pulmonary arteries: PA peak pressure: 39 mm Hg (S).  The patient currently denies nausea, vomiting, fever, chest pain, shortness of breath, orthopnea, dizziness, PND, cough, congestion, abdominal pain, hematochezia, melena, lower extremity edema, claudication.  Wt Readings from Last 3 Encounters:  03/16/14 187 lb 14.4 oz (85.231 kg)  02/10/14 183 lb 9.6 oz (83.28 kg)  01/22/14 179 lb 3.7 oz (81.3 kg)     Past Medical History  Diagnosis Date  . Hypertension   . Knee pain   . Edema   . Chronic kidney disease, stage III (moderate)   . Benign hypertensive kidney disease with chronic kidney disease stage I through stage IV, or unspecified   . Moderate aortic stenosis     moderate MR, severe TR  . Moderate to severe pulmonary hypertension   . Arthritis   . Cataract   . Hx of echocardiogram 09/03/2011    EF 55%, the left atrium is moderately dilated, there is moderate concentric left ventricular hypertrophy, left ventricular systolic function is normal.  . History of stress test 01/2001    Nagative adequate Bruce protocol exercise stress test with scintigrahic evidence of breast attenuation artifact with the possibility of mild anteroapical ischemia and an EF of 61% calculated by QGS.    Current Outpatient Prescriptions  Medication Sig Dispense Refill  . acetaminophen (TYLENOL) 500 MG tablet Take 500 mg by mouth every morning.    .Marland Kitchen  amLODipine (NORVASC) 5 MG tablet Take 1 tablet (5 mg total) by mouth daily. 30 tablet 6  . aspirin 81 MG tablet Take 81 mg by mouth daily.    . carvedilol (COREG) 6.25 MG tablet Take 1 tablet (6.25 mg total) by mouth 2 (two) times daily with a meal. 180 tablet 3  . furosemide (LASIX) 40 MG tablet Take 1 tablet (40 mg total) by mouth 2 (two) times daily.  (Patient taking differently: Take 80 mg by mouth 2 (two) times daily. ) 60 tablet 3  . hydrALAZINE (APRESOLINE) 50 MG tablet Take 1 tablet (50 mg total) by mouth 3 (three) times daily. 270 tablet 3  . nystatin (MYCOSTATIN/NYSTOP) 100000 UNIT/GM POWD Apply 1 gram topically between toes bid 60 g 3   No current facility-administered medications for this visit.    Allergies:    Allergies  Allergen Reactions  . Asa [Aspirin] Other (See Comments)    GI Upset    Social History:  The patient  reports that she has never smoked. She has never used smokeless tobacco. She reports that she does not drink alcohol or use illicit drugs.   Family history:  No family history on file.  ROS:  Please see the history of present illness.  All other systems reviewed and negative.   PHYSICAL EXAM: VS:  BP 134/50 mmHg  Pulse 59  Ht 5\' 5"  (1.651 m)  Wt 187 lb 14.4 oz (85.231 kg)  BMI 31.27 kg/m2 Well nourished, well developed, in no acute distress HEENT: Pupils are equal round react to light accommodation extraocular movements are intact.  Neck: no JVDNo cervical lymphadenopathy. Cardiac: Regular rate and rhythm a 3/6 systolic murmur Lungs:  clear to auscultation bilaterally, no wheezing, rhonchi or rales Abd: Positive bowel sounds. Distended.  Ext: 3+ lower extremity edema up to her lower back.  2+ radial  pulses. Skin: warm and dry Neuro:  Grossly normal  EKG: Sinus bradycardia 59 no clear P waves  ASSESSMENT AND PLAN:  Problem List Items Addressed This Visit    Acute on chronic diastolic congestive heart failure, NYHA class 3 - Primary    This is secondary to severe TR, pulm HTN, mod MR.  She is ~30# over her dry weight.  She will be admitted for IV diuresis.  Her lungs sound clear but I will Check a CXR.  Monitor SCr., which I suspect will improve once we start the diuretic, and potassium.   I suggest we add metolazone at discharge.  She is not a candidate for valve repair/replacement.    Chronic  renal insufficiency, stage III (moderate) (Chronic)    Baseline creatinine appears to be 1.8 to 2.0.    Essential hypertension, benign (Chronic)    Just mildly elevated today. Diastolic is on the low side. We'll continue to monitor as we give her the Lasix.    Moderate mitral regurgitation (Chronic)   Moderate to severe pulmonary hypertension (Chronic)   Severe tricuspid regurgitation (Chronic)    This is definitely contributing to her volume overload.

## 2014-03-16 NOTE — Patient Instructions (Signed)
[facility]. You will get a phone call from hospital admissions informing you when a bed is ready and what to do.

## 2014-03-16 NOTE — Assessment & Plan Note (Signed)
Just mildly elevated today. Diastolic is on the low side. We'll continue to monitor as we give her the Lasix.

## 2014-03-17 ENCOUNTER — Inpatient Hospital Stay (HOSPITAL_COMMUNITY): Payer: Medicare Other

## 2014-03-17 DIAGNOSIS — I10 Essential (primary) hypertension: Secondary | ICD-10-CM

## 2014-03-17 DIAGNOSIS — I5033 Acute on chronic diastolic (congestive) heart failure: Principal | ICD-10-CM

## 2014-03-17 DIAGNOSIS — N189 Chronic kidney disease, unspecified: Secondary | ICD-10-CM

## 2014-03-17 DIAGNOSIS — I350 Nonrheumatic aortic (valve) stenosis: Secondary | ICD-10-CM

## 2014-03-17 DIAGNOSIS — I441 Atrioventricular block, second degree: Secondary | ICD-10-CM

## 2014-03-17 DIAGNOSIS — I270 Primary pulmonary hypertension: Secondary | ICD-10-CM

## 2014-03-17 LAB — BASIC METABOLIC PANEL
ANION GAP: 15 (ref 5–15)
BUN: 58 mg/dL — AB (ref 6–23)
CO2: 24 mEq/L (ref 19–32)
CREATININE: 2.18 mg/dL — AB (ref 0.50–1.10)
Calcium: 8.6 mg/dL (ref 8.4–10.5)
Chloride: 104 mEq/L (ref 96–112)
GFR, EST AFRICAN AMERICAN: 22 mL/min — AB (ref >90)
GFR, EST NON AFRICAN AMERICAN: 19 mL/min — AB (ref >90)
Glucose, Bld: 94 mg/dL (ref 70–99)
Potassium: 3.8 mEq/L (ref 3.7–5.3)
Sodium: 143 mEq/L (ref 137–147)

## 2014-03-17 MED ORDER — FUROSEMIDE 10 MG/ML IJ SOLN: 80.0000 mg | Freq: Every day | INTRAMUSCULAR | Status: DC

## 2014-03-17 MED ORDER — FUROSEMIDE 10 MG/ML IJ SOLN
40.0000 mg | INTRAMUSCULAR | Status: AC
  Administered 2014-03-17: 40 mg via INTRAVENOUS
  Filled 2014-03-17: qty 4

## 2014-03-17 MED ORDER — FUROSEMIDE 10 MG/ML IJ SOLN
80.0000 mg | Freq: Every day | INTRAMUSCULAR | Status: DC
  Administered 2014-03-18: 80 mg via INTRAVENOUS
  Filled 2014-03-17: qty 8

## 2014-03-17 MED ORDER — CARVEDILOL 3.125 MG PO TABS
3.1250 mg | ORAL_TABLET | Freq: Two times a day (BID) | ORAL | Status: DC
  Administered 2014-03-17: 3.125 mg via ORAL
  Filled 2014-03-17: qty 1

## 2014-03-17 NOTE — Care Management Note (Signed)
    Page 1 of 2   03/22/2014     11:06:12 AM CARE MANAGEMENT NOTE 03/22/2014  Patient:  Meghan ChanceWHITE,Meghan H   Account Number:  [id number]  Date Initiated:  03/17/2014  Documentation initiated by:  GRAVES-BIGELOW,Cimberly Stoffel  Subjective/Objective Assessment:   Pt admitted from the office due to increased wt gain after tweaking meds. Pt has family support.     Action/Plan:   CM will continue to monitor for disposition needs.   Anticipated DC Date:  03/19/2014   Anticipated DC Plan:  HOME W HOME HEALTH SERVICES      DC Planning Services  CM consult      [facility]  Resumption Of Svcs/PTA Provider   Choice offered to / List presented to:  C-1 Patient        HH arranged  HH-1 RN  HH-10 DISEASE MANAGEMENT  HH-2 PT      [facility]  [facility]   Status of service:  In process, will continue to follow Medicare Important Message given?  YES (If response is "NO", the following Medicare IM given date fields will be blank) Date Medicare IM given:  03/19/2014 Medicare IM given by:  GRAVES-BIGELOW,Vasily Fedewa Date Additional Medicare IM given:  03/22/2014 Additional Medicare IM given by:  Tekeya Geffert GRAVES-BIGELOW  [facility]  Per UR Regulation:  Reviewed for med. necessity/level of care/duration of stay  If discussed at Long Length of Stay Meetings, dates discussed:    Comments:   03-22-14 [address] Tomi BambergerBrenda graves-Bigelow, RN,BSN [phone] CM did make [facility] aware [facility]. SOC to begin within 24-48 hrs post d/c. No further needs at this time.   03-17-14 1429 Tomi BambergerBrenda Graves-Bigelow, RN,BSN [phone] [facility] did call daughter to discuss HH needs. [facility] and [facility]. Pt will need resumption orders for services once medically stable for d/c. CM did provide the daughter the number to [employer] for [employer].

## 2014-03-17 NOTE — Progress Notes (Signed)
Subjective: Feels some better, currently sitting up in bed.  No chest pain  Objective: Vital signs in last 24 hours: Temp:  [97.8 F (36.6 C)-98.9 F (37.2 C)] 98 F (36.7 C) (12/16 0553) Pulse Rate:  [52-73] 59 (12/16 0858) Resp:  [16-18] 16 (12/16 0553) BP: (104-137)/(36-55) 108/54 mmHg (12/16 0858) SpO2:  [99 %] 99 % (12/16 0553) Weight:  [185 lb 4.8 oz (84.052 kg)-186 lb 6.4 oz (84.55 kg)] 186 lb 6.4 oz (84.55 kg) (12/16 0553) Weight change:    Intake/Output from previous day: -947 no wt change.  12/15 0701 - 12/16 0700 In: 3 [I.V.:3] Out: 950 [Urine:950] Intake/Output this shift:    PE: General:Pleasant affect, NAD Skin:Warm and dry, brisk capillary refill HEENT:normocephalic, sclera clear, mucus membranes moist Neck:supple, + JVD to jaw, no bruits  Heart:S1S2 RRR with 3/6 systolic murmur, no gallup, rub or click Lungs:diminished  without rales, rhonchi, or wheezes ONG:EXBMAbd:soft, non tender, + BS, do not palpate liver spleen or masses, tissue tense+ fluid Ext:+ 2edema more in hips and thighs lower ext below knees tight,  2+ radial pulses Neuro:alert and oriented X 3, MAE, follows commands, + facial symmetry   tele:  SB at times with large 1st degree AV block, at times difficult to say if SR vs A fib.  I have ordered stat EKG.  Lab Results:  Recent Labs  03/16/14 1400  WBC 4.9  HGB 9.7*  HCT 28.6*  PLT 141*   BMET  Recent Labs  03/16/14 1400 03/17/14 0230  NA 140 143  K 3.8 3.8  CL 102 104  CO2 23 24  GLUCOSE 144* 94  BUN 56* 58*  CREATININE 2.06* 2.18*  CALCIUM 8.8 8.6   No results for input(s): TROPONINI in the last 72 hours.  Invalid input(s): CK, MB  No results found for: CHOL, HDL, LDLCALC, LDLDIRECT, TRIG, CHOLHDL No results found for: WUXL2GHGBA1C   No results found for: TSH  Hepatic Function Panel  Recent Labs  03/16/14 1400  PROT 7.3  ALBUMIN 3.2*  AST 14  ALT 6  ALKPHOS 72  BILITOT 0.8   No results for input(s): CHOL in the last  72 hours. No results for input(s): PROTIME in the last 72 hours.     Studies/Results: No results found.  Medications: I have reviewed the patient's current medications. Scheduled Meds: . aspirin EC  81 mg Oral Daily  . carvedilol  6.25 mg Oral BID WC  . Chlorhexidine Gluconate Cloth  6 each Topical Q0600  . furosemide  40 mg Intravenous BID  . heparin  5,000 Units Subcutaneous 3 times per day  . hydrALAZINE  50 mg Oral TID  . mupirocin ointment  1 application Nasal BID  . potassium chloride  20 mEq Oral Daily  . sodium chloride  3 mL Intravenous Q12H   Continuous Infusions:  PRN Meds:.sodium chloride, acetaminophen, ondansetron (ZOFRAN) IV, sodium chloride  Assessment/Plan:80 y.o. thin and frail-appearing widowed [place] female, mother of 3, grandmother to 2 grandchildren. She has a history of labile hypertension, negative renal Dopplers and valvular heart disease with mild AI, mild to moderate AS, moderate MR and severe TR with severe pulmonary hypertension. Her last echo performed Jan 19, 2014 after being [facility] with LE edema. EF 55-60%, Sev LVH, normal wall motion, diastolic dysfunction. She was diuresed with IV lasix, change to PO and discharged home.  She had done well with outpt adjustments in diuretic, but yesterday on OV, her wt was  up 4 more pounds, with leg and abd edema. Admitted to diuresis, wt 30 lbs> dry weight.    Acute on Chronic diastolic HF NYHC 3    This is secondary to severe TR, pulm HTN, mod MR. She is ~30# over her dry weight. She will be admitted for IV diuresis. Her lungs sound clear but I will Check a CXR. Monitor SCr., which I suspect will improve once we start the diuretic, and potassium. I suggest we add metolazone at discharge. She is not a candidate for valve repair/replacement.  Pro bnp [id number]-- see CXR  On lasix 40 BID but only down 1 L, would increase to 80 mg IV daily vs BID, monitoring BP  EDEMA in HIPS and  THIGHS.    Chronic renal insufficiency, stage III (moderate) (Chronic)   Baseline creatinine appears to be 1.8 to 2.0.  Cr up slightly 2.16 from 2.06 monitor     Essential hypertension, benign (Chronic)   Just mildly elevated today. Diastolic is on the low side. We'll continue to monitor as we give her the Lasix.      Moderate mitral regurgitation (Chronic)   Moderate to severe pulmonary hypertension (Chronic)   Severe tricuspid regurgitation (Chronic)   This is definitely contributing to her volume overload.    Anemia, 9.7 hgb stable with improved plts, now 141 up from 125  Arrthymia, 1st degree AV block large, at times ? A fib, will check EKG  On EKG appears to be wenckebach with HR to 38.  On coreg - we will decrease her BB.    Thrombocytopenia stable   LOS: 1 day   Time spent with pt. :15 minutes. [facility] Kel[facility] HospitalNGOLD,LAURA R  Nurse Practitioner Certified Pager [phone] or after 5pm and on weekends call [phone] 03/17/2014, 10:00 AM  I have seen, examined and evaluated the patient this PM along with Nada BoozerLaura Ingold, PA-C.  After reviewing all the available data and chart,  I agree with her findings, examination as well as impression recommendations.  Very pleasant elderly woman admitted for acute or chronic diastolic heart failure with increased abdominal girth and edema.  I agree with increasing diuretic dosage. Would plan on IV Lasix for 2-3 days. I also agree with the use of Zaroxolyn as an outpatient. Would potentially consider starting that as an adjunctive therapy prior to discharge.  Lungs do sound clear. It would appear the most were congestion is related to right-sided failure. She has hip, thigh and abdominal edema.  She is on a beta blocker which we will decrease the dose of due to Wenckebach block. She is also on ocular reduction with hydralazine -- if her blood pressure were to increase with a lower dose of carvedilol would add nitrate combination to  hydralazine.  She does have baseline renal insufficiency. We'll monitor creatinine.  Stable anemia  Marykay LexHARDING,DAVID W, M.D., M.S. Interventional Cardiologist   Pager # [phone]

## 2014-03-17 NOTE — Progress Notes (Signed)
UR Completed Kedar Sedano Graves-Bigelow, RN,BSN [phone]  

## 2014-03-18 DIAGNOSIS — R001 Bradycardia, unspecified: Secondary | ICD-10-CM | POA: Diagnosis present

## 2014-03-18 DIAGNOSIS — I441 Atrioventricular block, second degree: Secondary | ICD-10-CM | POA: Diagnosis present

## 2014-03-18 LAB — BASIC METABOLIC PANEL
Anion gap: 14 (ref 5–15)
BUN: 58 mg/dL — AB (ref 6–23)
CO2: 25 mEq/L (ref 19–32)
CREATININE: 2.18 mg/dL — AB (ref 0.50–1.10)
Calcium: 9.1 mg/dL (ref 8.4–10.5)
Chloride: 104 mEq/L (ref 96–112)
GFR, EST AFRICAN AMERICAN: 22 mL/min — AB (ref >90)
GFR, EST NON AFRICAN AMERICAN: 19 mL/min — AB (ref >90)
Glucose, Bld: 87 mg/dL (ref 70–99)
Potassium: 4.3 mEq/L (ref 3.7–5.3)
Sodium: 143 mEq/L (ref 137–147)

## 2014-03-18 LAB — PRO B NATRIURETIC PEPTIDE: Pro B Natriuretic peptide (BNP): 20357 pg/mL — ABNORMAL HIGH (ref 0–450)

## 2014-03-18 MED ORDER — ISOSORBIDE DINITRATE 10 MG PO TABS
20.0000 mg | ORAL_TABLET | Freq: Three times a day (TID) | ORAL | Status: DC
  Administered 2014-03-18 – 2014-03-22 (×13): 20 mg via ORAL
  Filled 2014-03-18 (×14): qty 2

## 2014-03-18 NOTE — Progress Notes (Signed)
Principal Problem:   Acute on chronic diastolic congestive heart failure, NYHA class 3 Active Problems:   Bradycardia by electrocardiogram - with sinus pauses & Wenkebach Block   Atrioventricular block, Mobitz type 1, Wenckebach: now BB stopped.   Chronic renal insufficiency, stage III (moderate)   Essential hypertension, benign   Moderate aortic stenosis   Moderate to severe pulmonary hypertension   Acute on chronic diastolic heart failure  Subjective: Feels some better, currently sitting up in bed.  No chest pain Had more pauses last PM - Carvedilol d/c'd by on call MD.   No further pauses   Objective: Vital signs in last 24 hours: Temp:  [98.2 F (36.8 C)-98.7 F (37.1 C)] 98.2 F (36.8 C) (12/17 0502) Pulse Rate:  [56-80] 56 (12/17 0502) Resp:  [16-18] 18 (12/17 0502) BP: (129-166)/(40-56) 139/53 mmHg (12/17 0502) SpO2:  [95 %-98 %] 95 % (12/17 0502) Weight:  [182 lb (82.555 kg)] 182 lb (82.555 kg) (12/17 0502) Weight change: -3 lb 4.8 oz (-1.497 kg)   Intake/Output from previous day: -947 no wt change.  12/16 0701 - 12/17 0700 In: 243 [P.O.:240; I.V.:3] Out: 1200 [Urine:1200] Intake/Output this shift:    PE: General:Pleasant affect, NAD Skin:Warm and dry HEENT:normocephalic, sclera clear, mucus membranes moist Neck:supple, + JVD to just below angle of jaw, no bruits  Heart:S1S2 RRR with 3/6 (HSM @ RUSB & 2/6 HSM @ apex), no gallup, rub or click Lungs:diminished  without rales, rhonchi, or wheezes ZOX:WRUEAbd:soft, non tender, + BS, do not palpate liver spleen or masses, tissue tense+ fluid Ext:+ 2edema more in hips and thighs lower ext below knees tight,  2+ radial pulses Neuro:alert and oriented X 3, MAE, follows commands, + facial symmetry   tele:  SR, S Brady (since last PM less frequent Wenkebach).  Lab Results:  Recent Labs  03/16/14 1400  WBC 4.9  HGB 9.7*  HCT 28.6*  PLT 141*   BMET  Recent Labs  03/17/14 0230 03/18/14 0428  NA 143 143  K 3.8  4.3  CL 104 104  CO2 24 25  GLUCOSE 94 87  BUN 58* 58*  CREATININE 2.18* 2.18*  CALCIUM 8.6 9.1   No results for input(s): TROPONINI in the last 72 hours.  Invalid input(s): CK, MB  No results found for: CHOL, HDL, LDLCALC, LDLDIRECT, TRIG, CHOLHDL No results found for: [id number]   No results found for: TSH  Hepatic Function Panel  Recent Labs  03/16/14 1400  PROT 7.3  ALBUMIN 3.2*  AST 14  ALT 6  ALKPHOS 72  BILITOT 0.8   No results for input(s): CHOL in the last 72 hours. No results for input(s): PROTIME in the last 72 hours.  Studies/Results: Dg Chest Port 1 View  03/17/2014   CLINICAL DATA:  CHF.  Chronic kidney disease.  Hypertension.  EXAM: PORTABLE CHEST - 1 VIEW  COMPARISON:  01/19/2014  FINDINGS: Moderate enlargement of the cardiopericardial silhouette. No convincing mediastinal or hilar masses. Opacity in the lung bases, right greater than left, is consistent with a combination of pleural effusions and atelectasis. There is also vascular congestion and bilateral interstitial thickening. This is also similar to the prior exam.  No pneumothorax.  Bony thorax is demineralized but grossly intact.  IMPRESSION: Findings consistent with mild congestive heart failure. There is moderate cardiomegaly, interstitial thickening right greater than left bilateral pleural effusions, findings similar to the prior chest radiograph.   Electronically Signed   By: Amie Portlandavid  Ormond M.D.   On:  03/17/2014 13:34    Medications: I have reviewed the patient's current medications. Scheduled Meds: . aspirin EC  81 mg Oral Daily  . Chlorhexidine Gluconate Cloth  6 each Topical Q0600  . furosemide  80 mg Intravenous Daily  . heparin  5,000 Units Subcutaneous 3 times per day  . hydrALAZINE  50 mg Oral TID  . isosorbide dinitrate  20 mg Oral TID  . mupirocin ointment  1 application Nasal BID  . potassium chloride  20 mEq Oral Daily  . sodium chloride  3 mL Intravenous Q12H   Continuous Infusions:    PRN Meds:.sodium chloride, acetaminophen, ondansetron (ZOFRAN) IV, sodium chloride  Assessment/Plan:80 y.o. thin and frail-appearing widowed [place] female, mother of 3, grandmother to 2 grandchildren. She has a history of labile hypertension, negative renal Dopplers and valvular heart disease with mild AI, mild to moderate AS, moderate MR and severe TR with severe pulmonary hypertension. Her last echo performed Jan 19, 2014 [facility] with LE edema. EF 55-60%, Sev LVH, normal wall motion, diastolic dysfunction. She was diuresed with IV lasix, change to PO and discharged home.  She had done well with outpt adjustments in diuretic, but yesterday on OV, her wt was up 4 more pounds, with leg and abd edema. Admitted to diuresis, wt 30 lbs> dry weight.    Acute on Chronic diastolic HF NYHC 3;  This is secondary to severe TR, pulm HTN, mod MR *Mostly R sided w/ LE & Abdominal edema + JVD & EDEMA in HIPS and THIGHS.   Admit wgt reportedly 30 Lb > dry weight (weights have been all over the board with 140 lb in March, then up & down from 189 to 150 since -- not sure what "True Dry Wgt is".   Admitted for IV diuresis -- Lasix dose increased yesterday to 80 mg BID - she still has tense LE edema,but abdominal wall edema has improved.  Will continue with current dose of IV Lasix & add Zaroxolyn to AM dose tomorrow if UOP is not more brisk. Her lungs sound clear & no Pulm edema on CXR.and potassium.  Pro bnp [id number]-- see CXR      Chronic renal insufficiency, stage III (moderate) (Chronic)   Baseline creatinine appears to be 1.8 to 2.0.  Cr up slightly 2.18 from 2.06-- but essentially stable. May need to consider adding Zaroxolyn to AM lasix dosing.     Essential hypertension, benign (Chronic)   BP has been labile => with Coreg d/c'd due to Bradycardia / Pauses, will add Isordil to Hydralazine for additional afterload reduction.    Moderate mitral  regurgitation (Chronic)   Moderate to severe pulmonary hypertension (Chronic)- She is not a candidate for valve repair/replacement.    Severe tricuspid regurgitation (Chronic)   This is definitely contributing to her volume overload.    Anemia, 9.7 hgb stable with improved plts, now 141 up from 125;   Arrthymia/Bradycardia, 1st degree AV block large, at times ? A fib, will check EKG  On EKG appears to be wenckebach with HR to 38.    BB now d/c'd  Thrombocytopenia stable  HARDING, Piedad ClimesAVID W, M.D., M.S. Interventional Cardiologist   Pager # [phone]

## 2014-03-19 LAB — BASIC METABOLIC PANEL
Anion gap: 12 (ref 5–15)
BUN: 56 mg/dL — AB (ref 6–23)
CALCIUM: 8.7 mg/dL (ref 8.4–10.5)
CO2: 25 mEq/L (ref 19–32)
Chloride: 107 mEq/L (ref 96–112)
Creatinine, Ser: 1.98 mg/dL — ABNORMAL HIGH (ref 0.50–1.10)
GFR calc Af Amer: 24 mL/min — ABNORMAL LOW (ref >90)
GFR, EST NON AFRICAN AMERICAN: 21 mL/min — AB (ref >90)
Glucose, Bld: 95 mg/dL (ref 70–99)
Potassium: 3.8 mEq/L (ref 3.7–5.3)
Sodium: 144 mEq/L (ref 137–147)

## 2014-03-19 MED ORDER — FUROSEMIDE 10 MG/ML IJ SOLN
80.0000 mg | Freq: Two times a day (BID) | INTRAMUSCULAR | Status: DC
  Administered 2014-03-19 – 2014-03-20 (×4): 80 mg via INTRAVENOUS
  Filled 2014-03-19 (×5): qty 8

## 2014-03-19 MED ORDER — METOLAZONE 5 MG PO TABS
2.5000 mg | ORAL_TABLET | Freq: Once | ORAL | Status: AC
  Administered 2014-03-19: 2.5 mg via ORAL
  Filled 2014-03-19: qty 0.5

## 2014-03-19 MED ORDER — POTASSIUM CHLORIDE CRYS ER 20 MEQ PO TBCR
20.0000 meq | EXTENDED_RELEASE_TABLET | Freq: Two times a day (BID) | ORAL | Status: DC
  Administered 2014-03-19 – 2014-03-22 (×7): 20 meq via ORAL
  Filled 2014-03-19 (×7): qty 1

## 2014-03-19 NOTE — Progress Notes (Signed)
Patient Name: Loleta ChanceJanie H Carta Date of Encounter: 03/19/2014  Principal Problem:   Acute on chronic diastolic congestive heart failure, NYHA class 3 Active Problems:   Chronic renal insufficiency, stage III (moderate)   Essential hypertension, benign   Moderate aortic stenosis   Moderate to severe pulmonary hypertension   Acute on chronic diastolic heart failure   Bradycardia by electrocardiogram - with sinus pauses & Wenkebach Block   Atrioventricular block, Mobitz type 1, Wenckebach: now BB stopped.   Primary Cardiologist: Dr. Allyson SabalBerry  Patient Profile: 80 yo female w/ hx labile HTN, mild AI, mod AS, mod MR, severe TR, pulm htn, EF 55-60% w/ PAS 39 by echo 12/2013, D-CHF last admitted 12/2013. Admitted 12/15 w/ volume overload, weight up about 10 lbs.  SUBJECTIVE: Thinks she is breathing a little better  OBJECTIVE Filed Vitals:   03/18/14 1414 03/18/14 1706 03/18/14 1955 03/19/14 0500  BP: 143/51 156/42 128/37 139/59  Pulse: 74  75 70  Temp: 98.2 F (36.8 C)  98.1 F (36.7 C) 98.2 F (36.8 C)  TempSrc: Oral  Oral Oral  Resp: 16  16 16   Height:      Weight:    187 lb 1.6 oz (84.868 kg)  SpO2: 98%  97% 97%    Intake/Output Summary (Last 24 hours) at 03/19/14 0818 Last data filed at 03/18/14 2300  Gross per 24 hour  Intake      3 ml  Output    851 ml  Net   -848 ml   Filed Weights   03/17/14 0553 03/18/14 0502 03/19/14 0500  Weight: 186 lb 6.4 oz (84.55 kg) 182 lb (82.555 kg) 187 lb 1.6 oz (84.868 kg)    PHYSICAL EXAM General: Well developed, well nourished, female in no acute distress. Head: Normocephalic, atraumatic.  Neck: Supple without bruits, JVD to jaw. Lungs:  Resp regular and unlabored, rales bases. Heart: RRR, S1, S2, no S3, S4, 3/6 murmur; no rub. Abdomen: Soft, non-tender, non-distended, BS + x 4.  Extremities: No clubbing, cyanosis, trace-1+ thigh edema.  Neuro: Alert and oriented X 3. Moves all extremities spontaneously. Psych: Normal  affect.  LABS: CBC: Recent Labs  03/16/14 1400  WBC 4.9  NEUTROABS 2.6  HGB 9.7*  HCT 28.6*  MCV 95.3  PLT 141*   Basic Metabolic Panel: Recent Labs  03/18/14 0428 03/19/14 0410  NA 143 144  K 4.3 3.8  CL 104 107  CO2 25 25  GLUCOSE 87 95  BUN 58* 56*  CREATININE 2.18* 1.98*  CALCIUM 9.1 8.7   Liver Function Tests: Recent Labs  03/16/14 1400  AST 14  ALT 6  ALKPHOS 72  BILITOT 0.8  PROT 7.3  ALBUMIN 3.2*   BNP: PRO B NATRIURETIC PEPTIDE (BNP)  Date/Time Value Ref Range Status  03/18/2014 04:28 AM 20357.0* 0 - 450 pg/mL Final  03/16/2014 02:00 PM 19829.0* 0 - 450 pg/mL Final   TELE:  SR, very small p waves, some pauses, ?nonconducted PACs.      Radiology/Studies: Dg Chest Port 1 View 03/17/2014   CLINICAL DATA:  CHF.  Chronic kidney disease.  Hypertension.  EXAM: PORTABLE CHEST - 1 VIEW  COMPARISON:  01/19/2014  FINDINGS: Moderate enlargement of the cardiopericardial silhouette. No convincing mediastinal or hilar masses. Opacity in the lung bases, right greater than left, is consistent with a combination of pleural effusions and atelectasis. There is also vascular congestion and bilateral interstitial thickening. This is also similar to the prior exam.  No  pneumothorax.  Bony thorax is demineralized but grossly intact.  IMPRESSION: Findings consistent with mild congestive heart failure. There is moderate cardiomegaly, interstitial thickening right greater than left bilateral pleural effusions, findings similar to the prior chest radiograph.   Electronically Signed   By: Amie Portlandavid  Ormond M.D.   On: 03/17/2014 13:34     Current Medications:  . aspirin EC  81 mg Oral Daily  . Chlorhexidine Gluconate Cloth  6 each Topical Q0600  . furosemide  80 mg Intravenous BID  . heparin  5,000 Units Subcutaneous 3 times per day  . hydrALAZINE  50 mg Oral TID  . isosorbide dinitrate  20 mg Oral TID  . metolazone  2.5 mg Oral Once  . mupirocin ointment  1 application Nasal BID   . potassium chloride  20 mEq Oral Daily  . sodium chloride  3 mL Intravenous Q12H      ASSESSMENT AND PLAN: Principal Problem:   Acute on chronic diastolic congestive heart failure, NYHA class 3 - Lasix increased to BID and metolazone added 12/18. Follow I/O, weights.  Active Problems:   Chronic renal insufficiency, stage III (moderate) - follow with more aggressive diuresis.     Essential hypertension, benign - BP should improve w/ diuresis, follow    Moderate aortic stenosis/Moderate to severe pulmonary hypertension - echo 12/2013, PAS 39, follow    Bradycardia by electrocardiogram - with sinus pauses & Wenkebach Block - some non-conducted PACs seen on tele review, otherwise SR    Atrioventricular block, Mobitz type 1, Wenckebach: now BB stopped - minimal episodes seen overnight    Hypokalemia - borderline - K+ decreased overnight, will decrease more today, so will increase K+ to 20 meq bid.   Signed, Theodore Demarkhonda Barrett , PA-C 8:18 AM 03/19/2014  I have seen, examined and evaluated the patient this AM along with Theodore Demarkhonda Barrett,  PA-C.  After reviewing all the available data and chart,  I agree with her findings, examination as well as impression recommendations.  Initially she had pretty brisk diuresis, however overnight she had less diuresis. She was initially supposed to be on twice a day Lasix but was ordered only as once daily. I will convert twice a day today with one dose of Zaroxolyn with the morning dose of Lasix.  Would still plan to consider converting to oral Lasix tomorrow afternoon or Sunday. Unfortunately weights were not accurately recorded and she has apparently gained 5 pounds despite diuresing ~2 L. Most of her edema is in her lower back hips and thighs. Her abdominal edema has improved.   Her blood pressure remained stable with having beta blocker stopped. Her heart rate is much better with less Wenckebach block. Isordil was added hydralazine yesterday for more  afterload reduction.  Replete potassium.   Given her age and amount of edema, slow and steady diuresis is the best option. Previously recorded dry weights of 150 pounds of probably not accurate. I think the goal/target dry weight for her should probably be in the low 170 pound range. Unfortunately her weights or not accurate. The hospital, so I don't have that value to go by.   Marykay LexHARDING, Deolinda Frid W, M.D., M.S. Interventional Cardiologist   Pager # [phone]     ]

## 2014-03-20 LAB — BASIC METABOLIC PANEL
Anion gap: 11 (ref 5–15)
BUN: 54 mg/dL — AB (ref 6–23)
CALCIUM: 8.9 mg/dL (ref 8.4–10.5)
CO2: 26 mEq/L (ref 19–32)
CREATININE: 1.67 mg/dL — AB (ref 0.50–1.10)
Chloride: 106 mEq/L (ref 96–112)
GFR calc Af Amer: 30 mL/min — ABNORMAL LOW (ref >90)
GFR, EST NON AFRICAN AMERICAN: 26 mL/min — AB (ref >90)
Glucose, Bld: 94 mg/dL (ref 70–99)
POTASSIUM: 3.9 meq/L (ref 3.7–5.3)
Sodium: 143 mEq/L (ref 137–147)

## 2014-03-20 NOTE — Progress Notes (Signed)
Patient Name: Meghan Russo Date of Encounter: 03/20/2014  Principal Problem:   Acute on chronic diastolic congestive heart failure, NYHA class 3 Active Problems:   Chronic renal insufficiency, stage III (moderate)   Essential hypertension, benign   Moderate aortic stenosis   Moderate to severe pulmonary hypertension   Bradycardia by electrocardiogram - with sinus pauses & Wenkebach Block   Atrioventricular block, Mobitz type 1, Wenckebach: now BB stopped.   Primary Cardiologist: Dr. Allyson SabalBerry  Patient Profile: 80 yo female w/ hx labile HTN, mild AI, mod AS, mod MR, severe TR, pulm htn, EF 55-60% w/ PAS 39 by echo 12/2013, D-CHF last admitted 12/2013. Admitted 12/15 w/ volume overload, weight up about 10 lbs.  SUBJECTIVE: Thinks she is breathing a little better.  Feels that her abdomen has decreased in size since yesterday.  OBJECTIVE Filed Vitals:   03/19/14 1508 03/19/14 2035 03/20/14 0639 03/20/14 1022  BP: 123/41 151/47 151/57 138/53  Pulse: 78 64    Temp: 98.3 F (36.8 C) 97.8 F (36.6 C) 97.9 F (36.6 C)   TempSrc: Oral Oral Oral   Resp:  16 18   Height:      Weight:   176 lb 14.4 oz (80.241 kg)   SpO2: 100% 99% 99%     Intake/Output Summary (Last 24 hours) at 03/20/14 1025 Last data filed at 03/20/14 0900  Gross per 24 hour  Intake    844 ml  Output   2750 ml  Net  -1906 ml   Filed Weights   03/18/14 0502 03/19/14 0500 03/20/14 0639  Weight: 182 lb (82.555 kg) 187 lb 1.6 oz (84.868 kg) 176 lb 14.4 oz (80.241 kg)    PHYSICAL EXAM General: Well developed, well nourished, female in no acute distress. Head: Normocephalic, atraumatic.  Neck: Supple without bruits, JVD to jaw. Lungs:  Resp regular and unlabored, rales bases. Heart: RRR, S1, S2, no S3, S4, 3/6 murmur; no rub. Abdomen: Soft, non-tender, non-distended, BS + x 4.  Extremities: No clubbing, cyanosis, 1-2+ pitting edema on thighs and hips. Neuro: Alert and oriented X 3. Moves all extremities  spontaneously. Psych: Normal affect.  LABS: CBC:No results for input(s): WBC, NEUTROABS, HGB, HCT, MCV, PLT in the last 72 hours. Basic Metabolic Panel:  Recent Labs  16/01/9611/18/15 0410 03/20/14 0511  NA 144 143  K 3.8 3.9  CL 107 106  CO2 25 26  GLUCOSE 95 94  BUN 56* 54*  CREATININE 1.98* 1.67*  CALCIUM 8.7 8.9   Liver Function Tests:No results for input(s): AST, ALT, ALKPHOS, BILITOT, PROT, ALBUMIN in the last 72 hours. BNP: PRO B NATRIURETIC PEPTIDE (BNP)  Date/Time Value Ref Range Status  03/18/2014 04:28 AM 20357.0* 0 - 450 pg/mL Final  03/16/2014 02:00 PM 19829.0* 0 - 450 pg/mL Final   TELE:  SR, very small p waves, some pauses, ?nonconducted PACs.      Radiology/Studies: Dg Chest Port 1 View 03/17/2014   CLINICAL DATA:  CHF.  Chronic kidney disease.  Hypertension.  EXAM: PORTABLE CHEST - 1 VIEW  COMPARISON:  01/19/2014  FINDINGS: Moderate enlargement of the cardiopericardial silhouette. No convincing mediastinal or hilar masses. Opacity in the lung bases, right greater than left, is consistent with a combination of pleural effusions and atelectasis. There is also vascular congestion and bilateral interstitial thickening. This is also similar to the prior exam.  No pneumothorax.  Bony thorax is demineralized but grossly intact.  IMPRESSION: Findings consistent with mild congestive heart failure. There  is moderate cardiomegaly, interstitial thickening right greater than left bilateral pleural effusions, findings similar to the prior chest radiograph.   Electronically Signed   By: Amie Portlandavid  Ormond M.D.   On: 03/17/2014 13:34     Current Medications:  . aspirin EC  81 mg Oral Daily  . Chlorhexidine Gluconate Cloth  6 each Topical Q0600  . furosemide  80 mg Intravenous BID  . heparin  5,000 Units Subcutaneous 3 times per day  . hydrALAZINE  50 mg Oral TID  . isosorbide dinitrate  20 mg Oral TID  . mupirocin ointment  1 application Nasal BID  . potassium chloride  20 mEq Oral BID   . sodium chloride  3 mL Intravenous Q12H      ASSESSMENT AND PLAN: Principal Problem:   Acute on chronic diastolic congestive heart failure, NYHA class 3 - Lasix increased to BID and metolazone added 12/18. Follow I/O, weights.  Active Problems:   Chronic renal insufficiency, stage III (moderate) - follow with more aggressive diuresis. Cr stable.  Continue IV diuretics for now.  COnsider switch to oral tomorrow.  Target date for d/c would be Monday.    Essential hypertension, benign - BP should improve w/ diuresis, follow    Moderate aortic stenosis/Moderate to severe pulmonary hypertension - echo 12/2013, PAS 39, follow    Bradycardia by electrocardiogram - with sinus pauses & Wenkebach Block - some non-conducted PACs seen on tele review, otherwise SR    Atrioventricular block, Mobitz type 1, Wenckebach: now BB stopped - minimal episodes seen overnight    Hypokalemia - borderline - K+ decreased overnight, will decrease more today, so will increase K+ to 20 meq bid.    Isordil was added hydralazine yesterday for more afterload reduction.  Replete potassium.   Given her age and amount of edema, slow and steady diuresis is the best option. Previously recorded dry weights of 150 pounds of probably not accurate.   Goal/target dry weight for her should probably be in the low 170 pound range per Dr. Herbie BaltimoreHarding.   Corky CraftsVARANASI,JAYADEEP S., M.D.,  Interventional Cardiologist        ]

## 2014-03-21 LAB — BASIC METABOLIC PANEL
Anion gap: 14 (ref 5–15)
BUN: 50 mg/dL — AB (ref 6–23)
CO2: 23 mEq/L (ref 19–32)
Calcium: 9.1 mg/dL (ref 8.4–10.5)
Chloride: 102 mEq/L (ref 96–112)
Creatinine, Ser: 1.69 mg/dL — ABNORMAL HIGH (ref 0.50–1.10)
GFR calc Af Amer: 29 mL/min — ABNORMAL LOW (ref >90)
GFR, EST NON AFRICAN AMERICAN: 25 mL/min — AB (ref >90)
GLUCOSE: 86 mg/dL (ref 70–99)
POTASSIUM: 4.3 meq/L (ref 3.7–5.3)
Sodium: 139 mEq/L (ref 137–147)

## 2014-03-21 MED ORDER — FUROSEMIDE 80 MG PO TABS
80.0000 mg | ORAL_TABLET | Freq: Two times a day (BID) | ORAL | Status: DC
  Administered 2014-03-21 – 2014-03-22 (×3): 80 mg via ORAL
  Filled 2014-03-21 (×3): qty 1

## 2014-03-21 NOTE — Progress Notes (Signed)
Patient Name: Meghan Russo Date of Encounter: 03/21/2014  Principal Problem:   Acute on chronic diastolic congestive heart failure, NYHA class 3 Active Problems:   Chronic renal insufficiency, stage III (moderate)   Essential hypertension, benign   Moderate aortic stenosis   Moderate to severe pulmonary hypertension   Bradycardia by electrocardiogram - with sinus pauses & Wenkebach Block   Atrioventricular block, Mobitz type 1, Wenckebach: now BB stopped.   Primary Cardiologist: Dr. Allyson SabalBerry  Patient Profile: 80 yo female w/ hx labile HTN, mild AI, mod AS, mod MR, severe TR, pulm htn, EF 55-60% w/ PAS 39 by echo 12/2013, D-CHF last admitted 12/2013. Admitted 12/15 w/ volume overload, weight up about 10 lbs.  SUBJECTIVE: Thinks she is breathing a little better.  Feels that her abdomen has decreased in size since yesterday.  OBJECTIVE Filed Vitals:   03/20/14 2057 03/21/14 0500 03/21/14 0526 03/21/14 0830  BP: 146/56 131/26 132/39 133/43  Pulse: 80 53    Temp: 98.3 F (36.8 C) 98.3 F (36.8 C)    TempSrc: Oral Oral    Resp: 20 20    Height:      Weight:  171 lb 6.4 oz (77.747 kg)    SpO2: 98% 97%      Intake/Output Summary (Last 24 hours) at 03/21/14 1022 Last data filed at 03/21/14 0900  Gross per 24 hour  Intake    480 ml  Output   1950 ml  Net  -1470 ml   Filed Weights   03/19/14 0500 03/20/14 0639 03/21/14 0500  Weight: 187 lb 1.6 oz (84.868 kg) 176 lb 14.4 oz (80.241 kg) 171 lb 6.4 oz (77.747 kg)    PHYSICAL EXAM General: Well developed, well nourished, female in no acute distress. Head: Normocephalic, atraumatic.  Neck: Supple without bruits, JVD to jaw. Lungs:  Resp regular and unlabored, rales bases. Heart: RRR, S1, S2, no S3, S4, 3/6 murmur; no rub. Abdomen: Soft, non-tender, non-distended, BS + x 4.  Extremities: No clubbing, cyanosis, 1-2+ pitting edema on thighs and hips. Neuro: Alert and oriented X 3. Moves all extremities spontaneously. Psych:  Normal affect.  LABS: CBC:No results for input(s): WBC, NEUTROABS, HGB, HCT, MCV, PLT in the last 72 hours. Basic Metabolic Panel:  Recent Labs  16/01/9611/19/15 0511 03/21/14 0358  NA 143 139  K 3.9 4.3  CL 106 102  CO2 26 23  GLUCOSE 94 86  BUN 54* 50*  CREATININE 1.67* 1.69*  CALCIUM 8.9 9.1   Liver Function Tests:No results for input(s): AST, ALT, ALKPHOS, BILITOT, PROT, ALBUMIN in the last 72 hours. BNP: PRO B NATRIURETIC PEPTIDE (BNP)  Date/Time Value Ref Range Status  03/18/2014 04:28 AM 20357.0* 0 - 450 pg/mL Final  03/16/2014 02:00 PM 19829.0* 0 - 450 pg/mL Final   TELE:  SR, very small p waves, some pauses, ?nonconducted PACs.      Radiology/Studies: Dg Chest Port 1 View 03/17/2014   CLINICAL DATA:  CHF.  Chronic kidney disease.  Hypertension.  EXAM: PORTABLE CHEST - 1 VIEW  COMPARISON:  01/19/2014  FINDINGS: Moderate enlargement of the cardiopericardial silhouette. No convincing mediastinal or hilar masses. Opacity in the lung bases, right greater than left, is consistent with a combination of pleural effusions and atelectasis. There is also vascular congestion and bilateral interstitial thickening. This is also similar to the prior exam.  No pneumothorax.  Bony thorax is demineralized but grossly intact.  IMPRESSION: Findings consistent with mild congestive heart failure. There is  moderate cardiomegaly, interstitial thickening right greater than left bilateral pleural effusions, findings similar to the prior chest radiograph.   Electronically Signed   By: Amie Portlandavid  Ormond M.D.   On: 03/17/2014 13:34     Current Medications:  . aspirin EC  81 mg Oral Daily  . furosemide  80 mg Oral BID  . heparin  5,000 Units Subcutaneous 3 times per day  . hydrALAZINE  50 mg Oral TID  . isosorbide dinitrate  20 mg Oral TID  . mupirocin ointment  1 application Nasal BID  . potassium chloride  20 mEq Oral BID  . sodium chloride  3 mL Intravenous Q12H      ASSESSMENT AND PLAN: Principal  Problem:   Acute on chronic diastolic congestive heart failure, NYHA class 3 - Lasix increased to BID and metolazone added 12/18. Follow I/O, weights.  Active Problems:   Chronic renal insufficiency, stage III (moderate) -Cr stable.  switch to oral diuretics.  Target date for d/c would be Monday.    Essential hypertension, benign - BP better w/ diuresis, follow    Moderate aortic stenosis/Moderate to severe pulmonary hypertension - echo 12/2013, PAS 39, follow    Bradycardia by electrocardiogram - with sinus pauses & Wenkebach Block - some non-conducted PACs seen on tele review, otherwise SR    Atrioventricular block, Mobitz type 1, Wenckebach: now BB stopped - minimal episodes seen overnight    Hypokalemia - borderline - K+ decreased overnight, will decrease more today, so will increase K+ to 20 meq bid.    Isordil was added hydralazine  for more afterload reduction.  Repleted potassium.  Goal/target dry weight for her should probably be in the low 170 pound range per Dr. Herbie BaltimoreHarding.  POssible d/c tomorrow.   Corky CraftsVARANASI,Tashiya Souders S., M.D.,  Interventional Cardiologist        ]

## 2014-03-22 ENCOUNTER — Telehealth: Payer: Self-pay | Admitting: Cardiovascular Disease

## 2014-03-22 LAB — BASIC METABOLIC PANEL
Anion gap: 14 (ref 5–15)
BUN: 51 mg/dL — AB (ref 6–23)
CO2: 26 mEq/L (ref 19–32)
Calcium: 9.2 mg/dL (ref 8.4–10.5)
Chloride: 106 mEq/L (ref 96–112)
Creatinine, Ser: 1.71 mg/dL — ABNORMAL HIGH (ref 0.50–1.10)
GFR calc Af Amer: 29 mL/min — ABNORMAL LOW (ref >90)
GFR, EST NON AFRICAN AMERICAN: 25 mL/min — AB (ref >90)
GLUCOSE: 78 mg/dL (ref 70–99)
POTASSIUM: 4.5 meq/L (ref 3.7–5.3)
Sodium: 146 mEq/L (ref 137–147)

## 2014-03-22 MED ORDER — ISOSORBIDE DINITRATE 20 MG PO TABS: 20.0000 mg | ORAL_TABLET | Freq: Three times a day (TID) | ORAL | Status: AC

## 2014-03-22 MED ORDER — POTASSIUM CHLORIDE CRYS ER 20 MEQ PO TBCR: 40.0000 meq | EXTENDED_RELEASE_TABLET | Freq: Once | ORAL | Status: DC

## 2014-03-22 MED ORDER — METOLAZONE 2.5 MG PO TABS: 2.5000 mg | ORAL_TABLET | Freq: Every day | ORAL | Status: DC

## 2014-03-22 MED ORDER — FUROSEMIDE 80 MG PO TABS: 80.0000 mg | ORAL_TABLET | Freq: Two times a day (BID) | ORAL | Status: DC

## 2014-03-22 MED ORDER — METOLAZONE 5 MG PO TABS: 2.5000 mg | ORAL_TABLET | Freq: Every day | ORAL | Status: DC

## 2014-03-22 NOTE — Telephone Encounter (Signed)
FYI

## 2014-03-22 NOTE — Discharge Summary (Signed)
Physician Discharge Summary  Patient ID: Meghan Russo MRN: [id number] DOB/AGE: Feb 05, 1921 80 y.o.   Primary Cardiologist: Dr. Allyson Sabal  Admit date: 03/16/2014 Discharge date: 03/22/2014  Admission Diagnoses: Acute on Chronic Diastolic CHF  Discharge Diagnoses:  Principal Problem:   Acute on chronic diastolic congestive heart failure, NYHA class 3 Active Problems:   Chronic renal insufficiency, stage III (moderate)   Essential hypertension, benign   Moderate aortic stenosis   Moderate to severe pulmonary hypertension   Bradycardia by electrocardiogram - with sinus pauses & Wenkebach Block   Atrioventricular block, Mobitz type 1, Wenckebach: now BB stopped.   Discharged Condition: stable  Hospital Course: the patient is a 80 y/o female, followed by Dr. Allyson Sabal, with a h/o labile hypertension (negative renal Dopplers) and valvular heart disease with mild AI, mild to moderate AS, moderate MR and severe TR with severe pulmonary hypertension.  She is not a candidate for valve surgery. She also has chronic renal insufficieny with a baseline SCr between 1.8-2.0. She had an echo performed Jan 19, 2014, after being admitted to [facility] with LE edema, which demonstrated an of EF 55-60%, severe LVH, normal wall motion and diastolic dysfunction. She was diuresed with IV lasix, change to PO and discharged home.   She was recently evaluated in clinic 03/16/14 and complained of dyspnea with 2 pillow orthopnea, weight gain with increased abdominal girth and bilateral LEE. It was felt best to admit for IV diuretic therapy. On arrival, BNP was checked and was elevated at [address].  2D echo demonstrated normal LV function with EF at 55-60% w/o WMA. She had good diuresis with IV Lasix. Her edema and symptoms resolved. In total, she diuresed 7.4 L. Her discharge weight was 171 lbs. She was transitioned back to PO Lasix at 80 mg BID. 2.5 mg of metolazone was also added as an adjunct. Renal function remained stable  and close to her baseline. Scr day of discharge was 1.71. Several other medication adjustments were made. Her BB was discontinued due to Wenckebach and bradycardia with HR in the 30s. Her bradycardia resolved after discontinuation of her BB. Amlodipine was also discontinue due to borderline hypotension. She was however maintained on Lasix, hydralazine and Imdur with stable BP.   On hospital day 6, she was seen and examined by Dr. Delton See, who determined she was stable for discharge home. Post-hospital follow-up has been arranged with Ronie Spies, PA-C, on 03/29/14. It has been recommended that she get a repeat BMP day of appointment. She will continue to be followed long term by Dr. Allyson Sabal.    Consults: None  Significant Diagnostic Studies:   2D echo 03/21/14 Study Conclusions  - Left ventricle: The cavity size was normal. Wall thickness was increased in a pattern of severe LVH. Systolic function was normal. The estimated ejection fraction was in the range of 55% to 60%. Wall motion was normal; there were no regional wall motion abnormalities. Doppler parameters are consistent with restrictive physiology, indicative of decreased left ventricular diastolic compliance and/or increased left atrial pressure. - Aortic valve: There was moderate stenosis. There was moderate regurgitation. Valve area (VTI): 1.1 cm^2. Valve area (Vmax): 1.08 cm^2. Valve area (Vmean): 1.21 cm^2. - Mitral valve: Calcified annulus. Mildly thickened leaflets . There was mild regurgitation. - Right ventricle: The cavity size was moderately dilated. - Right atrium: The atrium was severely dilated. - Tricuspid valve: There was severe regurgitation. - Pulmonary arteries: PA peak pressure: 39 mm Hg (S).  Treatments: See Hospital Course  Discharge Exam: Blood pressure 144/58, pulse 79, temperature 98.5 F (36.9 C), temperature source Oral, resp. rate 18, height 5\' 5"  (1.651 m), weight 171 lb (77.565  kg), SpO2 96 %.   Disposition: 01-Home or Self Care      Discharge Instructions    Diet - low sodium heart healthy    Complete by:  As directed      Face-to-face encounter (required for Medicare/Medicaid patients)    Complete by:  As directed   I Elisabetta Mishra certify that this patient is under my care and that I, or a nurse practitioner or physician's assistant working with me, had a face-to-face encounter that meets the physician face-to-face encounter requirements with this patient on 03/22/2014. The encounter with the patient was in whole, or in part for the following medical condition(s) which is the primary reason for home health care (List medical condition): CHF with change/decline in patient status.  The encounter with the patient was in whole, or in part, for the following medical condition, which is the primary reason for home health care:  CHF  I certify that, based on my findings, the following services are medically necessary home health services:   NursingPhysical therapy    Reason for Medically Necessary Home Health Services:  Skilled Nursing- Change/Decline in Patient Status  My clinical findings support the need for the above services:  Unable to leave home safely without assistance and/or assistive device  Further, I certify that my clinical findings support that this patient is homebound due to:  Unable to leave home safely without assistance     Home Health    Complete by:  As directed   To provide the following care/treatments:   PTRN       Increase activity slowly    Complete by:  As directed             Medication List    STOP taking these medications        amLODipine 5 MG tablet  Commonly known as:  NORVASC     carvedilol 6.25 MG tablet  Commonly known as:  COREG      TAKE these medications        acetaminophen 500 MG tablet  Commonly known as:  TYLENOL  Take 500 mg by mouth every morning.     aspirin 81 MG tablet  Take 81 mg by mouth  daily.     furosemide 80 MG tablet  Commonly known as:  LASIX  Take 1 tablet (80 mg total) by mouth 2 (two) times daily.     hydrALAZINE 50 MG tablet  Commonly known as:  APRESOLINE  Take 1 tablet (50 mg total) by mouth 3 (three) times daily.     isosorbide dinitrate 20 MG tablet  Commonly known as:  ISORDIL  Take 1 tablet (20 mg total) by mouth 3 (three) times daily.     metolazone 2.5 MG tablet  Commonly known as:  ZAROXOLYN  Take 1 tablet (2.5 mg total) by mouth daily. Take 30 minutes prior to your morning dose of Lasix     nystatin 100000 UNIT/GM Powd  Apply 1 gram topically between toes bid     potassium chloride SA 20 MEQ tablet  Commonly known as:  K-DUR,KLOR-CON  Take 2 tablets (40 mEq total) by mouth once.       Follow-up Information    Follow up with Ronie Spiesayna Dunn, PA-C On 03/29/2014.   Specialty:  Cardiology   Why:  11:30 am  Contact information:   [address] [place] [address] [phone]       [facility]   Why:  Registered Nurse and Physical Therapy   Contact information:   [address] [place] [address] [phone]       IME SPENT ON DISCHARGE, INCLUDING PHYSICIAN TIME: >30 MINUTES   Signed: Robbie LisSIMMONS, Zachory Mangual 03/22/2014, 2:44 PM

## 2014-03-22 NOTE — Telephone Encounter (Signed)
New message     TCM in 1 weeks per [place]any Simmon. appt on  12/28 @ 11:30.

## 2014-03-22 NOTE — Progress Notes (Addendum)
Patient Profile: 80 yo female w/ hx labile HTN, mild AI, mod AS, mod MR, severe TR, pulm htn, EF 55-60% w/ PAS 39 by echo 12/2013, D-CHF last admitted 12/2013. Admitted 12/15 w/ volume overload, weight up about 10 lbs  Subjective: No complaints. She denies chest pain and resting dyspnea. She hasn't ambulated much and cannot r/o DOE.    Objective: Vital signs in last 24 hours: Temp:  [98.1 F (36.7 C)-98.9 F (37.2 C)] 98.5 F (36.9 C) (12/21 0514) Pulse Rate:  [60-83] 79 (12/21 0514) Resp:  [18] 18 (12/21 0514) BP: (108-137)/(43-57) 136/52 mmHg (12/21 0514) SpO2:  [94 %-99 %] 96 % (12/21 0514) Weight:  [171 lb (77.565 kg)] 171 lb (77.565 kg) (12/21 0514) Last BM Date: 03/21/14  Intake/Output from previous day: 12/20 0701 - 12/21 0700 In: 480 [P.O.:480] Out: 1635 [Urine:1635] Intake/Output this shift:    Medications Current Facility-Administered Medications  Medication Dose Route Frequency Provider Last Rate Last Dose  . 0.9 %  sodium chloride infusion  250 mL Intravenous PRN Dwana MelenaBryan W Hager, PA-C      . acetaminophen (TYLENOL) tablet 650 mg  650 mg Oral Q4H PRN Dwana MelenaBryan W Hager, PA-C      . aspirin EC tablet 81 mg  81 mg Oral Daily Dwana MelenaBryan W Hager, PA-C   81 mg at 03/21/14 1037  . furosemide (LASIX) tablet 80 mg  80 mg Oral BID Corky CraftsJayadeep S Varanasi, MD   80 mg at 03/22/14 [id number]  . heparin injection 5,000 Units  5,000 Units Subcutaneous 3 times per day Dwana MelenaBryan W Hager, PA-C   5,000 Units at 03/22/14 [id number]  . hydrALAZINE (APRESOLINE) tablet 50 mg  50 mg Oral TID Dwana MelenaBryan W Hager, PA-C   50 mg at 03/21/14 2128  . isosorbide dinitrate (ISORDIL) tablet 20 mg  20 mg Oral TID Marykay Lexavid W Harding, MD   20 mg at 03/21/14 2128  . ondansetron (ZOFRAN) injection 4 mg  4 mg Intravenous Q6H PRN Dwana MelenaBryan W Hager, PA-C      . potassium chloride SA (K-DUR,KLOR-CON) CR tablet 20 mEq  20 mEq Oral BID Joline SaltRhonda G Barrett, PA-C   20 mEq at 03/21/14 2128  . sodium chloride 0.9 % injection 3 mL  3 mL Intravenous Q12H Dwana MelenaBryan  W Hager, PA-C   3 mL at 03/21/14 2127  . sodium chloride 0.9 % injection 3 mL  3 mL Intravenous PRN Dwana MelenaBryan W Hager, PA-C        PE: General appearance: alert, cooperative and no distress Neck: + JVd Lungs: clear to auscultation bilaterally Heart: regular rate and rhythm and 3/6 SM heard throughout the precordium Extremities: no LEE Pulses: 2+ and symmetric Skin: warm and dry Neurologic: Grossly normal  Lab Results:  No results for input(s): WBC, HGB, HCT, PLT in the last 72 hours. BMET  Recent Labs  03/20/14 0511 03/21/14 0358  NA 143 139  K 3.9 4.3  CL 106 102  CO2 26 23  GLUCOSE 94 86  BUN 54* 50*  CREATININE 1.67* 1.69*  CALCIUM 8.9 9.1     Filed Weights   03/20/14 0639 03/21/14 0500 03/22/14 0514  Weight: 176 lb 14.4 oz (80.241 kg) 171 lb 6.4 oz (77.747 kg) 171 lb (77.565 kg)    Assessment/Plan  Principal Problem:   Acute on chronic diastolic congestive heart failure, NYHA class 3 Active Problems:   Chronic renal insufficiency, stage III (moderate)   Essential hypertension, benign   Moderate aortic stenosis   Moderate to severe pulmonary  hypertension   Bradycardia by electrocardiogram - with sinus pauses & Wenkebach Block   Atrioventricular block, Mobitz type 1, Wenckebach: now BB stopped.   1. Acute on Chronic Diastolic CHF: I/Os net negative 7.3 L. Weight is down: admission weight 187 lb. Weight today is 171 lb. Target dry weight is <170. She denies resting dyspnea. I have asked RN to ambulate this am with a walker to endure she is not having any DOE. If stable, can likely discharge home today. Continue Lasix and potassium supplementation. There was ? Regarding adding metolazone. Will defer to MD.   2. Chronic Renal Insuffiencey, stage III: SCr improved and stable. Was 2.1 on admit. 1.67 today  3. HTN: Controlled. Continue Lasix, Imdur and hydralazine (for afterload reduction in place of ACE-I, given baseline renal function).   4. Moderate AS: not a  candidate for valve surgery.     LOS: 6 days    Brittainy M. Delmer IslamSimmons, PA-C 03/22/2014 8:08 AM   The patient was seen, examined and discussed with Brittainy M. Sharol HarnessSimmons, PA-C and I agree with the above. She at her baseline weight, still has residual LE edema, we will add metolazone 2.5 mg po daily, we will discharge her home today and schedule an appointment for the next week i our clinic with BMP.   Lars MassonELSON, Deysha Cartier H 03/22/2014

## 2014-03-23 ENCOUNTER — Encounter: Payer: Self-pay | Admitting: *Deleted

## 2014-03-23 ENCOUNTER — Telehealth: Payer: Self-pay | Admitting: Cardiovascular Disease

## 2014-03-23 NOTE — Telephone Encounter (Signed)
Meghan Russo documented on a previous encounter regarding patient's orders.

## 2014-03-23 NOTE — Telephone Encounter (Signed)
Needed verbal order for in home PT-twice a week for 4 weeks for activity tolerance, strengthening and balance. Will fax over paper documentation for signature from Dr. Erlene QuanJ. Berry.

## 2014-03-23 NOTE — Telephone Encounter (Signed)
[facility] & initial visit. She completed assessment and requested PT OT and NA consults, needed verbal OK via telephone. Verified with Abbe AmsterdamKathryn Vogel this would be OK, affirmed this with HHN. She voiced understanding. No other questions or concerns at this time.

## 2014-03-29 ENCOUNTER — Encounter: Payer: Self-pay | Admitting: Physician Assistant

## 2014-03-29 ENCOUNTER — Ambulatory Visit (INDEPENDENT_AMBULATORY_CARE_PROVIDER_SITE_OTHER): Payer: Medicare Other | Admitting: Physician Assistant

## 2014-03-29 ENCOUNTER — Telehealth: Payer: Self-pay | Admitting: *Deleted

## 2014-03-29 VITALS — BP 130/70 | HR 49 | Ht 66.0 in | Wt 158.0 lb

## 2014-03-29 DIAGNOSIS — D649 Anemia, unspecified: Secondary | ICD-10-CM

## 2014-03-29 DIAGNOSIS — N183 Chronic kidney disease, stage 3 unspecified: Secondary | ICD-10-CM

## 2014-03-29 DIAGNOSIS — I509 Heart failure, unspecified: Secondary | ICD-10-CM

## 2014-03-29 DIAGNOSIS — I119 Hypertensive heart disease without heart failure: Secondary | ICD-10-CM | POA: Insufficient documentation

## 2014-03-29 DIAGNOSIS — I350 Nonrheumatic aortic (valve) stenosis: Secondary | ICD-10-CM

## 2014-03-29 DIAGNOSIS — I071 Rheumatic tricuspid insufficiency: Secondary | ICD-10-CM

## 2014-03-29 DIAGNOSIS — I10 Essential (primary) hypertension: Secondary | ICD-10-CM | POA: Insufficient documentation

## 2014-03-29 DIAGNOSIS — I351 Nonrheumatic aortic (valve) insufficiency: Secondary | ICD-10-CM

## 2014-03-29 DIAGNOSIS — I5033 Acute on chronic diastolic (congestive) heart failure: Secondary | ICD-10-CM | POA: Insufficient documentation

## 2014-03-29 DIAGNOSIS — R001 Bradycardia, unspecified: Secondary | ICD-10-CM

## 2014-03-29 DIAGNOSIS — I340 Nonrheumatic mitral (valve) insufficiency: Secondary | ICD-10-CM

## 2014-03-29 DIAGNOSIS — I110 Hypertensive heart disease with heart failure: Secondary | ICD-10-CM

## 2014-03-29 DIAGNOSIS — I5032 Chronic diastolic (congestive) heart failure: Secondary | ICD-10-CM

## 2014-03-29 LAB — BASIC METABOLIC PANEL
BUN / CREAT RATIO: 25 (ref 11–26)
BUN: 70 mg/dL — AB (ref 10–36)
CO2: 25 mmol/L (ref 18–29)
CREATININE: 2.8 mg/dL — AB (ref 0.57–1.00)
Calcium: 9.1 mg/dL (ref 8.7–10.3)
Chloride: 103 mmol/L (ref 96–108)
GFR calc Af Amer: 16 mL/min/{1.73_m2} — ABNORMAL LOW (ref >59)
GFR calc non Af Amer: 14 mL/min/{1.73_m2} — ABNORMAL LOW (ref >59)
GLUCOSE: 92 mg/dL (ref 65–99)
Potassium: 4.2 mmol/L (ref 3.5–5.2)
Sodium: 140 mmol/L (ref 134–144)

## 2014-03-29 MED ORDER — POTASSIUM CHLORIDE CRYS ER 20 MEQ PO TBCR
40.0000 meq | EXTENDED_RELEASE_TABLET | Freq: Every day | ORAL | Status: DC
Start: 2014-03-29 — End: 2014-04-01

## 2014-03-29 NOTE — Progress Notes (Signed)
[address] [place]  [address] Phone: [phone] Fax:  [phone]  Date:  03/29/2014   Patient ID:  Meghan ChanceJanie H Gertner, DOB 10/16/1920, MRN [id number]   PCP:  Gwynneth AlimentSANDERS,ROBYN N, MD  Cardiologist:  Allyson SabalBerry   History of Present Illness: Meghan ChanceJanie H Vilchis is a 80 y.o. female with history of CKD III, labile hypertension (reported negative renal dopplers) valvular heart disease with mod AS/AI, mild MR, severe TR, pulm HTN (PASP 39mmHg), chronic diastolic CHF, and bradycardia who presents for post-hospital follow-up. Last 2D echo 12/2013: EF 55-60%, sev LVH, normal wall motion, diastolic dysfunction, moderate AS/AI, mild MR, severe TR, PASP 39mmHg, sev RAE. She was evaluated in clinic 12/15 with c/o dyspnea and 2 pillow orthopnea, weight gain, increased abdominal girth and bilat LEE. She was admtted for IV diuresis with a discharge weight of 171 and renal function remained close to baseline with d/c Cr 1.71. Several other med adjustments were made - BB was discontinued due to Wenckebach and bradycardia with HR in the 30s (bradycardia resolved after discontinuation), amlodipine was also stopped due to borderline hypotension.  She comes in today feeling well. No CP, SOB, LEE, orthopnea, abdominal distention, presyncope or syncope. Unfortunately she did not realize that she was supposed to stop the Coreg or amlodipine. She is still taking both. Her daughter lives with her and fixes her pillbox.  Recent Labs: 03/16/2014: ALT 6; Hemoglobin 9.7* 03/18/2014: Pro B Natriuretic peptide (BNP) 20357.0* 03/22/2014: Creatinine 1.71*; Potassium 4.5  Wt Readings from Last 3 Encounters:  03/29/14 158 lb (71.668 kg)  03/22/14 171 lb (77.565 kg)  03/16/14 187 lb 14.4 oz (85.231 kg)     Past Medical History  Diagnosis Date  . Hypertension   . Knee pain   . Edema   . Chronic kidney disease, stage III (moderate)   . Moderate aortic stenosis 12/2013  . Moderate to severe pulmonary hypertension     . Arthritis   . Cataract   . History of stress test 01/2001    Negative adequate Bruce protocol exercise stress test with scintigrahic evidence of breast attenuation artifact with the possibility of mild anteroapical ischemia and an EF of 61% calculated by QGS.  . Bradycardia     a. 03/2014: HR 30's, sinus pauses and Wenkebach - BB discontinued.  . Wenckebach second degree AV block   . Severe tricuspid regurgitation 12/2013  . Moderate aortic insufficiency 12/2013  . Mild mitral regurgitation 12/2013  . Chronic diastolic CHF (congestive heart failure)   . Anemia     Current Outpatient Prescriptions  Medication Sig Dispense Refill  . acetaminophen (TYLENOL) 500 MG tablet Take 500 mg by mouth every morning.    Marland Kitchen. amLODipine (NORVASC) 5 MG tablet Take by mouth daily.    Marland Kitchen. aspirin 81 MG tablet Take 81 mg by mouth daily.    . carvedilol (COREG) 6.25 MG tablet Take by mouth daily.    . furosemide (LASIX) 80 MG tablet Take 1 tablet (80 mg total) by mouth 2 (two) times daily. 60 tablet 5  . hydrALAZINE (APRESOLINE) 50 MG tablet Take 1 tablet (50 mg total) by mouth 3 (three) times daily. 270 tablet 3  . isosorbide dinitrate (ISORDIL) 20 MG tablet Take 1 tablet (20 mg total) by mouth 3 (three) times daily. 90 tablet 5  . metolazone (ZAROXOLYN) 2.5 MG tablet Take 1 tablet (2.5 mg total) by mouth daily. Take 30 minutes prior to your morning dose of Lasix 30 tablet  5  . nystatin (MYCOSTATIN/NYSTOP) 100000 UNIT/GM POWD Apply 1 gram topically between toes bid 60 g 3  . potassium chloride SA (K-DUR,KLOR-CON) 20 MEQ tablet Take 2 tablets (40 mEq total) by mouth once. 60 tablet 5   No current facility-administered medications for this visit.    Allergies:   Asa   Social History:  The patient  reports that she has never smoked. She has never used smokeless tobacco. She reports that she does not drink alcohol or use illicit drugs.   Family History:  The patient's family history includes Hypertension  in an other family member.   ROS:  Please see the history of present illness.  All other systems reviewed and negative.   PHYSICAL EXAM:  VS:  BP 130/70 mmHg  Pulse 49  Ht 5\' 6"  (1.676 m)  Wt 158 lb (71.668 kg)  BMI 25.51 kg/m2 Well nourished frail elderly F in no acute distress HEENT: normal Neck: no JVD Cardiac:  normal S1, S2; RRR; 2/6 SEM at RUSB as well as apex Lungs:  clear to auscultation bilaterally, no wheezing, rhonchi or rales Abd: soft, nontender, no hepatomegaly Ext: no edema Skin: warm and dry Neuro:  moves all extremities spontaneously, no focal abnormalities noted  EKG:  Sinus bradycardia vs ectopic atrial bradycardia, 49bpm, TWI III  ASSESSMENT AND PLAN:  1. Chronic diastolic CHF likely d/t hypertensive heart disease - weight in our office today is of questionable authenticity but she appears euvolemic and is totally asymptomatic. Per recent discharge summary, she was supposed to stop Coreg and amlodipine but did not stop either. I have asked her to stop Coreg but continue amlodipine for now since she is normotensive while taking this. Have advised them to periodically monitor BP at home. Low salt diet reinforced and handout given. 2. Valvular heart disease with mod AS/AI, mild MR, severe TR with pulm HTN - followed clinically. 3. CKD stage III - recheck BMET today. If potassium needs adjusting we will need to send in corrected rx (currently says take 40meq once - she knows to take once daily). 4. Recent bradycardia with sinus pauses and Wenckebach - I have asked her to stop Coreg as previously planned. I drew an X on her bottle. 5. Essential HTN - controlled. Reportedly borderline during recent hospitalization but normotensive in clinic today. As above, we are stopping Coreg. I advised the patient and her daughter to check BP periodically at home and call if running higher since we are discontinuing BB. Will continue amlodipine since BP is good today on this. 6. H/o  anemia, chronic appearing - will defer further surveillance to PCP.  Dispo: F/u 3-4 months Dr. Allyson SabalBerry  Signed, Ronie Spiesayna Kiyoshi Schaab, PA-C  03/29/2014 10:50 AM

## 2014-03-29 NOTE — Telephone Encounter (Signed)
S/w daughter Mosetta AnisBarbara DPR on file. Per Ronie Spiesayna Dunn, PA to STOP metolazone, cont lasix dose, make sure K+ 40 meq daily, BMET 04/01/14. [name] read back new instructions x 2 w/verbal understanding. I will call pharm to make sure K+ correct.

## 2014-03-29 NOTE — Patient Instructions (Signed)
Your physician has recommended you make the following change in your medication:    STOP COREG    Your physician recommends that you return for lab work in:  BMET    FOLLOW  UP WITH DR BERRY IN FOUR MONTHS     Low-Sodium Eating Plan Sodium raises blood pressure and causes water to be held in the body. Getting less sodium from food will help lower your blood pressure, reduce any swelling, and protect your heart, liver, and kidneys. We get sodium by adding salt (sodium chloride) to food. Most of our sodium comes from canned, boxed, and frozen foods. Restaurant foods, fast foods, and pizza are also very high in sodium. Even if you take medicine to lower your blood pressure or to reduce fluid in your body, getting less sodium from your food is important. WHAT IS MY PLAN? Most people should limit their sodium intake to 2,300 mg a day. Your health care provider recommends that you limit your sodium intake to __________ a day.  WHAT DO I NEED TO KNOW ABOUT THIS EATING PLAN? For the low-sodium eating plan, you will follow these general guidelines:  Choose foods with a % Daily Value for sodium of less than 5% (as listed on the food label).   Use salt-free seasonings or herbs instead of table salt or sea salt.   Check with your health care provider or pharmacist before using salt substitutes.   Eat fresh foods.  Eat more vegetables and fruits.  Limit canned vegetables. If you do use them, rinse them well to decrease the sodium.   Limit cheese to 1 oz (28 g) per day.   Eat lower-sodium products, often labeled as "lower sodium" or "no salt added."  Avoid foods that contain monosodium glutamate (MSG). MSG is sometimes added to [place] food and some canned foods.  Check food labels (Nutrition Facts labels) on foods to learn how much sodium is in one serving.  Eat more home-cooked food and less restaurant, buffet, and fast food.  When eating at a restaurant, ask that your food be  prepared with less salt or none, if possible.  HOW DO I READ FOOD LABELS FOR SODIUM INFORMATION? The Nutrition Facts label lists the amount of sodium in one serving of the food. If you eat more than one serving, you must multiply the listed amount of sodium by the number of servings. Food labels may also identify foods as:  Sodium free--Less than 5 mg in a serving.  Very low sodium--35 mg or less in a serving.  Low sodium--140 mg or less in a serving.  Light in sodium--50% less sodium in a serving. For example, if a food that usually has 300 mg of sodium is changed to become light in sodium, it will have 150 mg of sodium.  Reduced sodium--25% less sodium in a serving. For example, if a food that usually has 400 mg of sodium is changed to reduced sodium, it will have 300 mg of sodium. WHAT FOODS CAN I EAT? Grains Low-sodium cereals, including oats, puffed wheat and rice, and shredded wheat cereals. Low-sodium crackers. Unsalted rice and pasta. Lower-sodium bread.  Vegetables Frozen or fresh vegetables. Low-sodium or reduced-sodium canned vegetables. Low-sodium or reduced-sodium tomato sauce and paste. Low-sodium or reduced-sodium tomato and vegetable juices.  Fruits Fresh, frozen, and canned fruit. Fruit juice.  Meat and Other Protein Products Low-sodium canned tuna and salmon. Fresh or frozen meat, poultry, seafood, and fish. Lamb. Unsalted nuts. Dried beans, peas, and lentils without  added salt. Unsalted canned beans. Homemade soups without salt. Eggs.  Dairy Milk. Soy milk. Ricotta cheese. Low-sodium or reduced-sodium cheeses. Yogurt.  Condiments Fresh and dried herbs and spices. Salt-free seasonings. Onion and garlic powders. Low-sodium varieties of mustard and ketchup. Lemon juice.  Fats and Oils Reduced-sodium salad dressings. Unsalted butter.  Other Unsalted popcorn and pretzels.  The items listed above may not be a complete list of recommended foods or beverages.  Contact your dietitian for more options. WHAT FOODS ARE NOT RECOMMENDED? Grains Instant hot cereals. Bread stuffing, pancake, and biscuit mixes. Croutons. Seasoned rice or pasta mixes. Noodle soup cups. Boxed or frozen macaroni and cheese. Self-rising flour. Regular salted crackers. Vegetables Regular canned vegetables. Regular canned tomato sauce and paste. Regular tomato and vegetable juices. Frozen vegetables in sauces. Salted french fries. Olives. Rosita FirePickles. Relishes. Sauerkraut. Salsa. Meat and Other Protein Products Salted, canned, smoked, spiced, or pickled meats, seafood, or fish. Bacon, ham, sausage, hot dogs, corned beef, chipped beef, and packaged luncheon meats. Salt pork. Jerky. Pickled herring. Anchovies, regular canned tuna, and sardines. Salted nuts. Dairy Processed cheese and cheese spreads. Cheese curds. Blue cheese and cottage cheese. Buttermilk.  Condiments Onion and garlic salt, seasoned salt, table salt, and sea salt. Canned and packaged gravies. Worcestershire sauce. Tartar sauce. Barbecue sauce. Teriyaki sauce. Soy sauce, including reduced sodium. Steak sauce. Fish sauce. Oyster sauce. Cocktail sauce. Horseradish. Regular ketchup and mustard. Meat flavorings and tenderizers. Bouillon cubes. Hot sauce. Tabasco sauce. Marinades. Taco seasonings. Relishes. Fats and Oils Regular salad dressings. Salted butter. Margarine. Ghee. Bacon fat.  Other Potato and tortilla chips. Corn chips and puffs. Salted popcorn and pretzels. Canned or dried soups. Pizza. Frozen entrees and pot pies.  The items listed above may not be a complete list of foods and beverages to avoid. Contact your dietitian for more information. Document Released: 09/08/2001 Document Revised: 03/24/2013 Document Reviewed: 01/21/2013 [facility] Patient Information 2015 [employer] [place]. This information is not intended to replace advice given to you by your health care provider. Make sure you discuss any questions  you have with your health care provider.

## 2014-04-01 ENCOUNTER — Other Ambulatory Visit (INDEPENDENT_AMBULATORY_CARE_PROVIDER_SITE_OTHER): Payer: Medicare Other | Admitting: *Deleted

## 2014-04-01 ENCOUNTER — Telehealth: Payer: Self-pay | Admitting: *Deleted

## 2014-04-01 DIAGNOSIS — I5033 Acute on chronic diastolic (congestive) heart failure: Secondary | ICD-10-CM

## 2014-04-01 LAB — BASIC METABOLIC PANEL
BUN: 77 mg/dL — ABNORMAL HIGH (ref 6–23)
CALCIUM: 9.1 mg/dL (ref 8.4–10.5)
CO2: 27 mEq/L (ref 19–32)
Chloride: 104 mEq/L (ref 96–112)
Creatinine, Ser: 2.6 mg/dL — ABNORMAL HIGH (ref 0.4–1.2)
GFR: 21.85 mL/min — AB (ref >60.00)
Glucose, Bld: 89 mg/dL (ref 70–99)
POTASSIUM: 4.9 meq/L (ref 3.5–5.1)
Sodium: 139 mEq/L (ref 135–145)

## 2014-04-01 NOTE — Telephone Encounter (Signed)
s/w pt and her grandson about lab results. I advised to stop K+ and cont to stay of metolazone. BMET 1/5. I advised to watch salt intake carefully. Both pt and grandson verbalized understanding.

## 2014-04-06 ENCOUNTER — Other Ambulatory Visit (INDEPENDENT_AMBULATORY_CARE_PROVIDER_SITE_OTHER): Payer: Medicare Other | Admitting: *Deleted

## 2014-04-06 DIAGNOSIS — I5033 Acute on chronic diastolic (congestive) heart failure: Secondary | ICD-10-CM

## 2014-04-06 LAB — BASIC METABOLIC PANEL
BUN: 65 mg/dL — ABNORMAL HIGH (ref 6–23)
CO2: 26 mEq/L (ref 19–32)
Calcium: 9 mg/dL (ref 8.4–10.5)
Chloride: 105 mEq/L (ref 96–112)
Creatinine, Ser: 2.1 mg/dL — ABNORMAL HIGH (ref 0.4–1.2)
GFR: 29.13 mL/min — ABNORMAL LOW (ref >60.00)
GLUCOSE: 110 mg/dL — AB (ref 70–99)
Potassium: 3.6 mEq/L (ref 3.5–5.1)
SODIUM: 139 meq/L (ref 135–145)

## 2014-04-07 ENCOUNTER — Telehealth: Payer: Self-pay | Admitting: *Deleted

## 2014-04-07 NOTE — Telephone Encounter (Signed)
s/w pt's daughter [name], [place] on [place]. [name] notified of lab results and to continue on current regimen. [name] verbalized understanding to Plan of care.

## 2014-04-08 ENCOUNTER — Ambulatory Visit (INDEPENDENT_AMBULATORY_CARE_PROVIDER_SITE_OTHER): Payer: Medicare Other | Admitting: Podiatrist

## 2014-04-08 ENCOUNTER — Encounter: Payer: Self-pay | Admitting: Podiatrist

## 2014-04-08 DIAGNOSIS — I83214 Varicose veins of right lower extremity with both ulcer of heel and midfoot and inflammation: Secondary | ICD-10-CM

## 2014-04-08 DIAGNOSIS — I83215 Varicose veins of right lower extremity with both ulcer other part of foot and inflammation: Secondary | ICD-10-CM

## 2014-04-08 DIAGNOSIS — I83211 Varicose veins of right lower extremity with both ulcer of thigh and inflammation: Secondary | ICD-10-CM

## 2014-04-08 DIAGNOSIS — I83212 Varicose veins of right lower extremity with both ulcer of calf and inflammation: Secondary | ICD-10-CM

## 2014-04-08 DIAGNOSIS — I83219 Varicose veins of right lower extremity with both ulcer of unspecified site and inflammation: Secondary | ICD-10-CM

## 2014-04-08 DIAGNOSIS — L988 Other specified disorders of the skin and subcutaneous tissue: Secondary | ICD-10-CM

## 2014-04-08 DIAGNOSIS — I83213 Varicose veins of right lower extremity with both ulcer of ankle and inflammation: Secondary | ICD-10-CM

## 2014-04-08 DIAGNOSIS — L97319 Non-pressure chronic ulcer of right ankle with unspecified severity: Secondary | ICD-10-CM

## 2014-04-08 DIAGNOSIS — I83013 Varicose veins of right lower extremity with ulcer of ankle: Secondary | ICD-10-CM

## 2014-04-08 DIAGNOSIS — R609 Edema, unspecified: Secondary | ICD-10-CM

## 2014-04-08 DIAGNOSIS — I83218 Varicose veins of right lower extremity with both ulcer of other part of lower extremity and inflammation: Secondary | ICD-10-CM

## 2014-04-08 MED ORDER — KETOCONAZOLE 2 % EX CREA: 1.0000 "application " | TOPICAL_CREAM | Freq: Every day | CUTANEOUS | Status: DC

## 2014-04-08 NOTE — Progress Notes (Deleted)
   Subjective:    Patient ID: Meghan Russo, female    DOB: 12/03/1920, 81 y.o.   MRN: [id number]  HPI    Review of Systems  Skin:       Wounds to right leg.  All other systems reviewed and are negative.      Objective:   Physical Exam        Assessment & Plan:

## 2014-04-08 NOTE — Patient Instructions (Signed)
Apply the antifungal cream between the toes of your right foot 2 times per day for 3 weeks.  If there is still weeping, call and let us know.

## 2014-04-09 NOTE — Progress Notes (Signed)
Chief Complaint  Patient presents with  . cellulitis    "She wants you to look at that weeping foot thing."  pt's dtr, Meghan Russo and pt point to the right forefoot and toes.  The right leg is covered with dry appearing wraps and Meghan Russo states Dr. Allyne GeeSanders has pt being [facility] at [facility].     HPI: Patient is 81 y.o. female who presents today for weeping at the forefoot of the right foot and swelling in between the toes of the right foot.  She has been at the wound center for unna boots and has a dried unna boot on the right lower leg at today's visit that is hardly intact.    Allergies  Allergen Reactions  . Asa [Aspirin] Other (See Comments)    GI Upset    Physical Exam  Patient is awake, alert and oriented x 3.  She has residual swelling on the right forefoot and some dried skin that appears to have been from a bullae or resolved blister on the top of the foot.  In between the toes is dried, macerated tissue that is malodorous.  Once removed, no sign of ulcerations noted.  No redness or streaking is present.  No active drainage or weeping is present.  A superficial ulcer is present on the right anterior shin which is revealed once the unna boot is removed.  No sign of infection present,.   Assessment: interdigital maceration right, resolved bullae dorsum right foot at base of toes  Plan: cleansed well between toes.  Applied castellani's paint.  Recommended topical ketoconazole and a rx was written.  Re-wrapped a new unna boot on the right foot and leg.  Recommended continued follow up a the wound healing center for the leg wounds.

## 2014-04-14 ENCOUNTER — Encounter: Payer: Self-pay | Admitting: Cardiovascular Disease

## 2014-04-14 ENCOUNTER — Ambulatory Visit (INDEPENDENT_AMBULATORY_CARE_PROVIDER_SITE_OTHER): Payer: Medicare Other | Admitting: Cardiovascular Disease

## 2014-04-14 VITALS — BP 150/60 | HR 60 | Ht 66.0 in | Wt 168.9 lb

## 2014-04-14 DIAGNOSIS — I5033 Acute on chronic diastolic (congestive) heart failure: Secondary | ICD-10-CM

## 2014-04-14 DIAGNOSIS — I10 Essential (primary) hypertension: Secondary | ICD-10-CM

## 2014-04-14 DIAGNOSIS — I350 Nonrheumatic aortic (valve) stenosis: Secondary | ICD-10-CM

## 2014-04-14 NOTE — Assessment & Plan Note (Signed)
History of hypertension with blood pressure today measured 150/60. She is on hydralazine and amlodipine. Continue current meds at current dosing

## 2014-04-14 NOTE — Assessment & Plan Note (Signed)
The patient was recently admitted to [facility] for approximately a week from 12/15 through 03/22/14 for shortness of breath, volume overload and acute on chronic diastolic heart failure. She was diuresed promptly 17 pounds. Her dry weight is approximately 171 pounds. She weighs a 168. She is aware of salt restriction. She is on furosemide 80 mg a day. I have told her family that should she start to gain weight the car office. She can increase her Lasix to twice a day for several days in that event.

## 2014-04-14 NOTE — Patient Instructions (Signed)
Please follow up with an extender in 3 months from today's visit and then again in 6 months from today's visit.  Your physician wants you to follow-up in 1 year with Dr. Allyson SabalBerry. You will receive a reminder letter in the mail 2 months in advance. If you do not receive a letter, please call our office to schedule the follow-up appointment.

## 2014-04-14 NOTE — Assessment & Plan Note (Signed)
Despite does have moderate aortic stenosis with a recent 2-D echo performed 01/19/14 revealing an aortic valve area of 1 cm patient has normal LV function, severe TR with pulmonary hypertension. At this point no interventional therapy is been contemplated

## 2014-04-14 NOTE — Progress Notes (Signed)
04/14/2014 Meghan Russo   1921-01-19  [id number]  Primary Physician Meghan Aliment, MD Primary Cardiologist: Meghan Gess MD Meghan Russo   HPI:  The patient is a 81 year old, thin and frail-appearing widowed [place] American female, mother of 3, grandmother to 2 grandchildren accompanied by one of her daughters today. I saw her 9 months ago. She has a history of labile hypertension, negative renal Dopplers and valvular heart disease with mild AI, mild to moderate AS, moderate MR and severe TR with severe pulmonary hypertension. She was admitted for acute on chronic diastolic heart failure on 03/16/14 for one week. She gained 30 pounds and diuresed approximately 20 at discharge with a dry weight of 171 pounds. Today her weight is 168 pounds on furosemide 80 mg a day. She denies shortness of breath today.   Current Outpatient Prescriptions  Medication Sig Dispense Refill  . acetaminophen (TYLENOL) 500 MG tablet Take 500 mg by mouth every morning.    Marland Kitchen amLODipine (NORVASC) 5 MG tablet Take 5 mg by mouth daily.     Marland Kitchen aspirin 81 MG tablet Take 81 mg by mouth daily.    . furosemide (LASIX) 80 MG tablet Take 1 tablet (80 mg total) by mouth 2 (two) times daily. 60 tablet 5  . hydrALAZINE (APRESOLINE) 50 MG tablet Take 1 tablet (50 mg total) by mouth 3 (three) times daily. 270 tablet 3  . isosorbide dinitrate (ISORDIL) 20 MG tablet Take 1 tablet (20 mg total) by mouth 3 (three) times daily. 90 tablet 5  . ketoconazole (NIZORAL) 2 % cream Apply 1 application topically daily. 60 g 2  . nystatin (MYCOSTATIN/NYSTOP) 100000 UNIT/GM POWD Apply 1 gram topically between toes bid 60 g 3   No current facility-administered medications for this visit.    Allergies  Allergen Reactions  . Asa [Aspirin] Other (See Comments)    GI Upset    History   Social History  . Marital Status: Widowed    Spouse Name: N/A    Number of Children: N/A  . Years of Education: N/A   Occupational  History  . Not on file.   Social History Main Topics  . Smoking status: Never Smoker   . Smokeless tobacco: Never Used  . Alcohol Use: No  . Drug Use: No  . Sexual Activity: Not on file   Other Topics Concern  . Not on file   Social History Narrative     Review of Systems: General: negative for chills, fever, night sweats or weight changes.  Cardiovascular: negative for chest pain, dyspnea on exertion, edema, orthopnea, palpitations, paroxysmal nocturnal dyspnea or shortness of breath Dermatological: negative for rash Respiratory: negative for cough or wheezing Urologic: negative for hematuria Abdominal: negative for nausea, vomiting, diarrhea, bright red blood per rectum, melena, or hematemesis Neurologic: negative for visual changes, syncope, or dizziness All other systems reviewed and are otherwise negative except as noted above.    Blood pressure 150/60, pulse 60, height  (1.676 m), weight 168 lb 14.4 oz (76.613 kg).  General appearance: alert and no distress Neck: no adenopathy, no carotid bruit, no JVD, supple, symmetrical, trachea midline and thyroid not enlarged, symmetric, no tenderness/mass/nodules Lungs: clear to auscultation bilaterally Heart: soft outflow tract murmur Extremities: extremities normal, atraumatic, no cyanosis or edema  EKG not performed today  ASSESSMENT AND PLAN:   Moderate aortic stenosis Despite does have moderate aortic stenosis with a recent 2-D echo performed 01/19/14 revealing an aortic valve area of 1  cm patient has normal LV function, severe TR with pulmonary hypertension. At this point no interventional therapy is been contemplated   Hypertension History of hypertension with blood pressure today measured 150/60. She is on hydralazine and amlodipine. Continue current meds at current dosing   Acute on chronic diastolic heart failure The patient [facility] from 12/15  through 03/22/14 for shortness of breath, volume overload and acute on chronic diastolic heart failure. She was diuresed promptly 17 pounds. Her dry weight is approximately 171 pounds. She weighs a 168. She is aware of salt restriction. She is on furosemide 80 mg a day. I have told her family that should she start to gain weight the car office. She can increase her Lasix to twice a day for several days in that event.       Meghan GessJonathan J. Gurdeep Keesey MD FACP,FACC,FAHA, [facility] 04/14/2014 12:54 PM

## 2014-04-16 ENCOUNTER — Encounter (HOSPITAL_BASED_OUTPATIENT_CLINIC_OR_DEPARTMENT_OTHER): Payer: Medicare Other | Attending: Internal Medicine

## 2014-04-16 DIAGNOSIS — L97311 Non-pressure chronic ulcer of right ankle limited to breakdown of skin: Secondary | ICD-10-CM | POA: Diagnosis not present

## 2014-04-16 DIAGNOSIS — I87331 Chronic venous hypertension (idiopathic) with ulcer and inflammation of right lower extremity: Secondary | ICD-10-CM | POA: Insufficient documentation

## 2014-04-16 DIAGNOSIS — L97911 Non-pressure chronic ulcer of unspecified part of right lower leg limited to breakdown of skin: Secondary | ICD-10-CM | POA: Diagnosis not present

## 2014-04-19 ENCOUNTER — Telehealth: Payer: Self-pay | Admitting: Cardiovascular Disease

## 2014-04-19 NOTE — Telephone Encounter (Signed)
Okey RegalCarol Genevieve Norlander( [facility]) is calling today because [name] has CHF and has had a weight gain of 9lbs in the last week . All vitals were stable today ... Please call    Thanks

## 2014-04-19 NOTE — Telephone Encounter (Signed)
Okey RegalCarol is calling back to speak with a nurse about pt's current condition

## 2014-04-28 NOTE — Telephone Encounter (Signed)
Received this note this afternoon, [facility] for [facility] w/ no symptoms.   She says this issue has already been addressed, "weight gain" was artificial, had to do with an uncalibrated home scale for this patient. Patient had been followed by home health about this and weight has been reported as stable.

## 2014-04-30 DIAGNOSIS — L97911 Non-pressure chronic ulcer of unspecified part of right lower leg limited to breakdown of skin: Secondary | ICD-10-CM | POA: Diagnosis not present

## 2014-04-30 DIAGNOSIS — L97311 Non-pressure chronic ulcer of right ankle limited to breakdown of skin: Secondary | ICD-10-CM | POA: Diagnosis not present

## 2014-04-30 DIAGNOSIS — I87331 Chronic venous hypertension (idiopathic) with ulcer and inflammation of right lower extremity: Secondary | ICD-10-CM | POA: Diagnosis not present

## 2014-05-04 ENCOUNTER — Telehealth: Payer: Self-pay | Admitting: Cardiovascular Disease

## 2014-05-04 NOTE — Telephone Encounter (Signed)
Left message for Meghan Russo, okay to extend PT.

## 2014-05-04 NOTE — Telephone Encounter (Signed)
Rolm Galarik called in requesting an extention on the pt's home physical therapy. Please call  Thanks

## 2014-05-07 ENCOUNTER — Encounter (HOSPITAL_BASED_OUTPATIENT_CLINIC_OR_DEPARTMENT_OTHER): Payer: Medicare Other | Attending: Internal Medicine

## 2014-05-07 DIAGNOSIS — L97311 Non-pressure chronic ulcer of right ankle limited to breakdown of skin: Secondary | ICD-10-CM | POA: Diagnosis not present

## 2014-05-07 DIAGNOSIS — I87333 Chronic venous hypertension (idiopathic) with ulcer and inflammation of bilateral lower extremity: Secondary | ICD-10-CM | POA: Insufficient documentation

## 2014-05-07 DIAGNOSIS — L97919 Non-pressure chronic ulcer of unspecified part of right lower leg with unspecified severity: Secondary | ICD-10-CM | POA: Diagnosis not present

## 2014-05-14 DIAGNOSIS — L97311 Non-pressure chronic ulcer of right ankle limited to breakdown of skin: Secondary | ICD-10-CM | POA: Diagnosis not present

## 2014-05-14 DIAGNOSIS — I87333 Chronic venous hypertension (idiopathic) with ulcer and inflammation of bilateral lower extremity: Secondary | ICD-10-CM | POA: Diagnosis not present

## 2014-05-14 DIAGNOSIS — L97919 Non-pressure chronic ulcer of unspecified part of right lower leg with unspecified severity: Secondary | ICD-10-CM | POA: Diagnosis not present

## 2014-05-21 DIAGNOSIS — I87333 Chronic venous hypertension (idiopathic) with ulcer and inflammation of bilateral lower extremity: Secondary | ICD-10-CM | POA: Diagnosis not present

## 2014-05-21 DIAGNOSIS — L97919 Non-pressure chronic ulcer of unspecified part of right lower leg with unspecified severity: Secondary | ICD-10-CM | POA: Diagnosis not present

## 2014-05-21 DIAGNOSIS — L97311 Non-pressure chronic ulcer of right ankle limited to breakdown of skin: Secondary | ICD-10-CM | POA: Diagnosis not present

## 2014-05-24 ENCOUNTER — Telehealth: Payer: Self-pay | Admitting: Cardiovascular Disease

## 2014-05-24 NOTE — Telephone Encounter (Signed)
Communicated Dr. Erin Hearingroitoru's advice to caller. She indicated understanding, verbalized instructions given. Will update us Friday w/ patient's weight. Advised to call for any concerns in the meantime.

## 2014-05-24 NOTE — Telephone Encounter (Signed)
LMTCB

## 2014-05-24 NOTE — Telephone Encounter (Signed)
Please take an additional 40 mg of furosemide with the usual 80 mg dose every morning for 3 days and call back with weight on Friday morning

## 2014-05-24 NOTE — Telephone Encounter (Signed)
Pt have gained over 5lbs,this is fluid. She need something called in for this,Dr Allyson SabalBerry said when this happen to call.

## 2014-05-24 NOTE — Telephone Encounter (Signed)
Daughter reports 5lb weight gain for pt over course of 1 week.   Pt has no reported symptoms. States "feels good", denies dyspnea.  She is "about the same" as she has been. She does have ongoing LE edema, caller unsure if this is more severe than usual.  Reports daily weighing, pt was 172 last Monday, 174 Friday, 177 this AM.  Pt is on Lasix 80mg  BID.

## 2014-05-28 DIAGNOSIS — I87333 Chronic venous hypertension (idiopathic) with ulcer and inflammation of bilateral lower extremity: Secondary | ICD-10-CM | POA: Diagnosis not present

## 2014-05-28 DIAGNOSIS — L97919 Non-pressure chronic ulcer of unspecified part of right lower leg with unspecified severity: Secondary | ICD-10-CM | POA: Diagnosis not present

## 2014-05-28 DIAGNOSIS — L97311 Non-pressure chronic ulcer of right ankle limited to breakdown of skin: Secondary | ICD-10-CM | POA: Diagnosis not present

## 2014-05-31 ENCOUNTER — Telehealth: Payer: Self-pay | Admitting: Cardiovascular Disease

## 2014-05-31 NOTE — Telephone Encounter (Signed)
Returned call to [name] (daughter - DPR). She states her mother's weight has been going up. Confirmed that medication adjustment that was recommended last week was taken correctly (add 40mg  lasix to 80mg  AM dose x3 days). She states her mother "feels pretty good" and she has NO SOB, NO orthopnea, NO abdominal swelling, NO lower extremity edema, although her knees are puffy but the arthritis in them is bothering her.   Weight Friday 2/26  180 lbs   Saturday 2/27 181 lbs  Sunday 2/29  182 lbs  Will defer to DOD, Dr. Royann Shiversroitoru to review and advise on adjustment of diuretics as appropriate.

## 2014-05-31 NOTE — Telephone Encounter (Signed)
Per the Answering Service:Pt fluid is going up, and she is gaining weight.

## 2014-05-31 NOTE — Telephone Encounter (Signed)
If she is asymptomatic I would not rush to change her diuretics any further. Will defer to Dr. Allyson SabalBerry whether we need to become more aggressive with diuretic therapy. She should definite call us back if Meghan Russo develops dyspnea or edema

## 2014-05-31 NOTE — Telephone Encounter (Signed)
Claris Chealled Barbara, daughter, and informed her that no med changes are directed at this time. Advised to monitor for SOB or edema and contact office should this develop. Informed her that this information will be sent to Dr. Allyson SabalBerry for review as well. Patient has appointment 4/14 with [place], [place]

## 2014-06-04 ENCOUNTER — Encounter (HOSPITAL_BASED_OUTPATIENT_CLINIC_OR_DEPARTMENT_OTHER): Payer: Medicare Other | Attending: Internal Medicine

## 2014-06-04 DIAGNOSIS — L97211 Non-pressure chronic ulcer of right calf limited to breakdown of skin: Secondary | ICD-10-CM | POA: Diagnosis not present

## 2014-06-04 DIAGNOSIS — L97321 Non-pressure chronic ulcer of left ankle limited to breakdown of skin: Secondary | ICD-10-CM | POA: Diagnosis not present

## 2014-06-04 DIAGNOSIS — I87333 Chronic venous hypertension (idiopathic) with ulcer and inflammation of bilateral lower extremity: Secondary | ICD-10-CM | POA: Insufficient documentation

## 2014-06-07 ENCOUNTER — Other Ambulatory Visit: Payer: Self-pay | Admitting: Cardiovascular Disease

## 2014-06-11 DIAGNOSIS — L97211 Non-pressure chronic ulcer of right calf limited to breakdown of skin: Secondary | ICD-10-CM | POA: Diagnosis not present

## 2014-06-11 DIAGNOSIS — L97321 Non-pressure chronic ulcer of left ankle limited to breakdown of skin: Secondary | ICD-10-CM | POA: Diagnosis not present

## 2014-06-11 DIAGNOSIS — I87333 Chronic venous hypertension (idiopathic) with ulcer and inflammation of bilateral lower extremity: Secondary | ICD-10-CM | POA: Diagnosis not present

## 2014-06-16 ENCOUNTER — Telehealth: Payer: Self-pay | Admitting: Cardiovascular Disease

## 2014-06-16 NOTE — Telephone Encounter (Signed)
[name] is calling because Mrs. Weinel has gained weight and has some swelling . Marland Kitchen. Please call    Thanks

## 2014-06-16 NOTE — Telephone Encounter (Signed)
Pt.s care giver states and just wanted you to know that Meghan Russo has gained 14 lbs in the past 10 days

## 2014-06-18 DIAGNOSIS — L97211 Non-pressure chronic ulcer of right calf limited to breakdown of skin: Secondary | ICD-10-CM | POA: Diagnosis not present

## 2014-06-18 DIAGNOSIS — I87333 Chronic venous hypertension (idiopathic) with ulcer and inflammation of bilateral lower extremity: Secondary | ICD-10-CM | POA: Diagnosis not present

## 2014-06-18 DIAGNOSIS — L97321 Non-pressure chronic ulcer of left ankle limited to breakdown of skin: Secondary | ICD-10-CM | POA: Diagnosis not present

## 2014-06-18 NOTE — Telephone Encounter (Signed)
Have her come in to see mid-level provider first of next week

## 2014-06-21 NOTE — Telephone Encounter (Signed)
Spoke with patient.  She is not sob, but does says that her abdomen and ankles are swollen. Office visit made for 8:45 06/22/14.

## 2014-06-22 ENCOUNTER — Ambulatory Visit (INDEPENDENT_AMBULATORY_CARE_PROVIDER_SITE_OTHER): Payer: Medicare Other | Admitting: Cardiology

## 2014-06-22 ENCOUNTER — Encounter: Payer: Self-pay | Admitting: Cardiology

## 2014-06-22 ENCOUNTER — Ambulatory Visit: Payer: Medicare Other | Admitting: Cardiology

## 2014-06-22 VITALS — BP 118/55 | HR 54 | Ht 64.0 in | Wt 203.2 lb

## 2014-06-22 DIAGNOSIS — I10 Essential (primary) hypertension: Secondary | ICD-10-CM

## 2014-06-22 DIAGNOSIS — Z79899 Other long term (current) drug therapy: Secondary | ICD-10-CM

## 2014-06-22 LAB — BASIC METABOLIC PANEL
BUN: 80 mg/dL — ABNORMAL HIGH (ref 6–23)
CO2: 21 mEq/L (ref 19–32)
Calcium: 8.6 mg/dL (ref 8.4–10.5)
Chloride: 105 mEq/L (ref 96–112)
Creat: 2.47 mg/dL — ABNORMAL HIGH (ref 0.50–1.10)
Glucose, Bld: 93 mg/dL (ref 70–99)
Potassium: 4.6 mEq/L (ref 3.5–5.3)
SODIUM: 137 meq/L (ref 135–145)

## 2014-06-22 MED ORDER — METOLAZONE 2.5 MG PO TABS: 2.5000 mg | ORAL_TABLET | Freq: Every day | ORAL | Status: DC

## 2014-06-22 NOTE — Patient Instructions (Signed)
Your physician has recommended you make the following change in your medication: start new prescription for metolazone 2.5 mg as directed on the bottle. This has already been sent to [facility].  Your physician recommends that you return for lab work today.  Your physician recommends that you schedule a follow-up appointment on Monday with Vernona RiegerLaura.

## 2014-06-22 NOTE — Progress Notes (Signed)
Cardiology Office Note   Date:  06/22/2014   ID:  Meghan Russo, DOB [birth year], MRN [id number]  PCP:  Meghan Aliment, MD  Cardiologist:    Dr. Allyson Russo  Chief Complaint  Patient presents with  . weight gain    patient complains of gaining weight for about 1-2 weeks. will sometime have SOB      History of Present Illness: Meghan Russo is a 81 y.o. female who presents for weight gain.  35 lbs since 04/14/14.  She is now mildly dyspneic on exertion. She has no problems sleeping at night no shortness of breath that wakes her from sleep and she is not propping herself up to sleep at night.  She denies any chest pain. She recently was seen at [facility] by Dr. Micael Russo weight was 192 on that day.  Also her labs for that visit creatinine was 1.8 with a BUN of 34 he would see her back in one year.  She has been taking 60 mg of Lasix in the morning and 40 in the afternoon. This has not helped her diuresing.   She has a history of labile hypertension, negative renal Dopplers and valvular heart disease last echo 01/19/14 EF 55-60% with severe LVH,  with mild AI, mild to moderate AS, moderate MR and severe TR with severe pulmonary hypertension. She was admitted for acute on chronic diastolic heart failure on 03/16/14 for one week. She had gained t that time 30 pounds and diuresed approximately 20 at discharge with a dry weight of 171 pounds.      Past Medical History  Diagnosis Date  . Hypertension   . Knee pain   . Edema   . Chronic kidney disease, stage III (moderate)   . Moderate aortic stenosis 12/2013  . Moderate to severe pulmonary hypertension   . Arthritis   . Cataract   . History of stress test 01/2001    Negative adequate Bruce protocol exercise stress test with scintigrahic evidence of breast attenuation artifact with the possibility of mild anteroapical ischemia and an EF of 61% calculated by QGS.  . Bradycardia     a. 03/2014: HR 30's, sinus pauses and Wenkebach - BB  discontinued.  . Wenckebach second degree AV block   . Severe tricuspid regurgitation 12/2013  . Moderate aortic insufficiency 12/2013  . Mild mitral regurgitation 12/2013  . Chronic diastolic CHF (congestive heart failure)   . Anemia     Past Surgical History  Procedure Laterality Date  . Vericose vein surgery    . Cataract extraction    . Inguinal lymph node biopsy Right 08/2011     Current Outpatient Prescriptions  Medication Sig Dispense Refill  . acetaminophen (TYLENOL) 500 MG tablet Take 500 mg by mouth every morning.    Marland Kitchen amLODipine (NORVASC) 5 MG tablet Take 5 mg by mouth daily.     Marland Kitchen aspirin 81 MG tablet Take 81 mg by mouth daily.    . carvedilol (COREG) 6.25 MG tablet TAKE 1 TABLET (6.25 MG TOTAL) BY MOUTH 2 (TWO) TIMES DAILY WITH A MEAL. 180 tablet 2  . furosemide (LASIX) 40 MG tablet TAKE 1 TABLET (40 MG TOTAL) BY MOUTH 2 (TWO) TIMES DAILY. (Patient taking differently: TAKES 60 MG IN THE MORNING AND 40 MG IN THE EVENING) 180 tablet 2  . hydrALAZINE (APRESOLINE) 50 MG tablet TAKE 1 TABLET (50 MG TOTAL) BY MOUTH 3 (THREE) TIMES DAILY. 270 tablet 2  . isosorbide dinitrate (ISORDIL) 20 MG tablet  Take 1 tablet (20 mg total) by mouth 3 (three) times daily. 90 tablet 5  . ketoconazole (NIZORAL) 2 % cream Apply 1 application topically daily. 60 g 2  . nystatin (MYCOSTATIN/NYSTOP) 100000 UNIT/GM POWD Apply 1 gram topically between toes bid 60 g 3  . metolazone (ZAROXOLYN) 2.5 MG tablet Take 1 tablet (2.5 mg total) by mouth daily. 30 minutes  prior to morning furosemide dose. 30 tablet 0   No current facility-administered medications for this visit.    Allergies:   Asa    Social History:  The patient  reports that she has never smoked. She has never used smokeless tobacco. She reports that she does not drink alcohol or use illicit drugs.   Family History:  The patient's family history includes Hypertension in an other family member.    ROS:  General:no colds or fevers,  increase of weight  Skin:no rashes or ulcers HEENT:no blurred vision, no congestion CV:see HPI PUL:see HPI GI:no diarrhea constipation or melena, no indigestion GU:no hematuria, no dysuria MS:no joint pain, no claudication, ++ edema Neuro:no syncope, no lightheadedness Endo:no diabetes, no thyroid disease  Wt Readings from Last 3 Encounters:  06/22/14 203 lb 3.2 oz (92.171 kg)  04/14/14 168 lb 14.4 oz (76.613 kg)  04/08/14 162 lb (73.483 kg)     PHYSICAL EXAM: VS:  BP 118/55 mmHg  Pulse 54  Ht  (1.626 m)  Wt 203 lb 3.2 oz (92.171 kg)  BMI 34.86 kg/m2 , BMI Body mass index is 34.86 kg/(m^2). General:Pleasant affect, NAD Skin:Warm and dry, brisk capillary refill HEENT:normocephalic, sclera clear, mucus membranes moist Neck:supple, + JVD, no bruits  Heart:S1S2 RRR with 1-2/6 systolic murmur, gallup, rub or click Lungs:clear without rales, rhonchi, or wheezes ZOX:WRUE, non tender, + BS, do not palpate liver spleen or masses AVW:UJWJXB lower ext edema to lower back.  2+ radial pulses Neuro:alert and oriented X 3, MAE, follows commands, + facial symmetry    EKG:  EKG is ordered today. The ekg ordered today demonstrates SR with PACs and junct escape beats   Recent Labs: 01/20/2014: Magnesium 2.4 03/16/2014: ALT 6; Hemoglobin 9.7*; Platelets 141* 03/18/2014: Pro B Natriuretic peptide (BNP) 20357.0* 06/22/2014: BUN 80*; Creatinine 2.47*; Potassium 4.6; Sodium 137    Lipid Panel No results found for: CHOL, TRIG, HDL, CHOLHDL, VLDL, LDLCALC, LDLDIRECT     Other studies Reviewed: Additional studies/ records that were reviewed today include: echo, labs from March with renal. And renal OV note    ASSESSMENT AND PLAN:  Acute on chronic diastolic heart failure The patient admitted to [facility] in Dec 2015 for approximately a week from 12/15 through 03/22/14 for shortness of breath, volume overload and acute on chronic diastolic heart failure. She was diuresed  promptly 17 pounds. Her dry weight is approximately 168-171 pounds. She is aware of salt restriction. She is on furosemide 80 mg a day. 40 BID is how she takes. Though they tell me she has been taking 60 mg in AM and 40 in pm.    Wt up 35 lbs. Today and she has difficulty ambulating.   No SOB add zaroxolyn 2.5 mg prior to am lasix-- if no increase in urine outpt she will call.  I will see her back on Monday.   I did dis cuss possible need for hospitalization with her renal dysfunction. If she does not improve over the next several days we may need to admit before I see her back on Monday.  We will  draw basic metabolic panel today   Moderate aortic stenosis Despite does have moderate aortic stenosis with a recent 2-D echo performed 01/19/14 revealing an aortic valve area of 1 cm patient has normal LV function, severe TR with pulmonary hypertension. At this point no interventional therapy is been contemplated   Hypertension History of hypertension with blood pressure today stable. She is on hydralazine and amlodipine and coreg . Continue current meds at current dosing  CKD stage III recently seen by renal physician and stable we'll monitor basic metabolic panel with her diuresing.     Current medicines are reviewed with the patient today.  The patient Has  concerns regarding medicines.  The following changes have been made:  See above Labs/ tests ordered today include:see above  Disposition:   FU:  see above  Nyoka LintSigned, INGOLD,LAURA R, NP  06/22/2014 6:44 PM    [facility] [address], [place], [place]  [address]/ [address] [place], [place] Phone: [phone]; Fax: [phone]  [phone]

## 2014-06-25 DIAGNOSIS — I87333 Chronic venous hypertension (idiopathic) with ulcer and inflammation of bilateral lower extremity: Secondary | ICD-10-CM | POA: Diagnosis not present

## 2014-06-25 DIAGNOSIS — L97211 Non-pressure chronic ulcer of right calf limited to breakdown of skin: Secondary | ICD-10-CM | POA: Diagnosis not present

## 2014-06-25 DIAGNOSIS — L97321 Non-pressure chronic ulcer of left ankle limited to breakdown of skin: Secondary | ICD-10-CM | POA: Diagnosis not present

## 2014-06-28 ENCOUNTER — Encounter: Payer: Self-pay | Admitting: Cardiology

## 2014-06-28 ENCOUNTER — Ambulatory Visit (INDEPENDENT_AMBULATORY_CARE_PROVIDER_SITE_OTHER): Payer: Medicare Other | Admitting: Cardiology

## 2014-06-28 ENCOUNTER — Inpatient Hospital Stay (HOSPITAL_COMMUNITY)
Admission: AD | Admit: 2014-06-28 | Discharge: 2014-07-12 | DRG: 291 | Disposition: A | Discharge status: Skilled Nursing Facility | Payer: Medicare Other | Source: Ambulatory Visit | Attending: Cardiovascular Disease | Admitting: Cardiovascular Disease

## 2014-06-28 VITALS — BP 118/80 | HR 51 | Ht 64.0 in | Wt 203.1 lb

## 2014-06-28 DIAGNOSIS — R001 Bradycardia, unspecified: Secondary | ICD-10-CM | POA: Diagnosis present

## 2014-06-28 DIAGNOSIS — I351 Nonrheumatic aortic (valve) insufficiency: Secondary | ICD-10-CM | POA: Diagnosis not present

## 2014-06-28 DIAGNOSIS — I5031 Acute diastolic (congestive) heart failure: Secondary | ICD-10-CM | POA: Diagnosis present

## 2014-06-28 DIAGNOSIS — I441 Atrioventricular block, second degree: Secondary | ICD-10-CM | POA: Diagnosis present

## 2014-06-28 DIAGNOSIS — I350 Nonrheumatic aortic (valve) stenosis: Secondary | ICD-10-CM | POA: Diagnosis not present

## 2014-06-28 DIAGNOSIS — I272 Other secondary pulmonary hypertension: Secondary | ICD-10-CM | POA: Diagnosis present

## 2014-06-28 DIAGNOSIS — I130 Hypertensive heart and chronic kidney disease with heart failure and stage 1 through stage 4 chronic kidney disease, or unspecified chronic kidney disease: Secondary | ICD-10-CM | POA: Diagnosis not present

## 2014-06-28 DIAGNOSIS — Z7982 Long term (current) use of aspirin: Secondary | ICD-10-CM | POA: Diagnosis not present

## 2014-06-28 DIAGNOSIS — I509 Heart failure, unspecified: Secondary | ICD-10-CM | POA: Diagnosis not present

## 2014-06-28 DIAGNOSIS — I352 Nonrheumatic aortic (valve) stenosis with insufficiency: Secondary | ICD-10-CM | POA: Diagnosis present

## 2014-06-28 DIAGNOSIS — I5033 Acute on chronic diastolic (congestive) heart failure: Secondary | ICD-10-CM | POA: Diagnosis present

## 2014-06-28 DIAGNOSIS — N179 Acute kidney failure, unspecified: Secondary | ICD-10-CM | POA: Diagnosis not present

## 2014-06-28 DIAGNOSIS — N183 Chronic kidney disease, stage 3 (moderate): Secondary | ICD-10-CM | POA: Diagnosis not present

## 2014-06-28 DIAGNOSIS — Z8249 Family history of ischemic heart disease and other diseases of the circulatory system: Secondary | ICD-10-CM

## 2014-06-28 DIAGNOSIS — N184 Chronic kidney disease, stage 4 (severe): Secondary | ICD-10-CM | POA: Diagnosis present

## 2014-06-28 DIAGNOSIS — R0600 Dyspnea, unspecified: Secondary | ICD-10-CM | POA: Diagnosis present

## 2014-06-28 DIAGNOSIS — I10 Essential (primary) hypertension: Secondary | ICD-10-CM | POA: Diagnosis not present

## 2014-06-28 DIAGNOSIS — R32 Unspecified urinary incontinence: Secondary | ICD-10-CM | POA: Diagnosis present

## 2014-06-28 DIAGNOSIS — R601 Generalized edema: Secondary | ICD-10-CM

## 2014-06-28 DIAGNOSIS — I119 Hypertensive heart disease without heart failure: Secondary | ICD-10-CM | POA: Diagnosis present

## 2014-06-28 DIAGNOSIS — Z79899 Other long term (current) drug therapy: Secondary | ICD-10-CM | POA: Diagnosis not present

## 2014-06-28 DIAGNOSIS — N189 Chronic kidney disease, unspecified: Secondary | ICD-10-CM

## 2014-06-28 DIAGNOSIS — R0602 Shortness of breath: Secondary | ICD-10-CM

## 2014-06-28 DIAGNOSIS — I110 Hypertensive heart disease with heart failure: Secondary | ICD-10-CM | POA: Diagnosis not present

## 2014-06-28 LAB — COMPREHENSIVE METABOLIC PANEL
ALT: 9 U/L (ref 0–35)
AST: 16 U/L (ref 0–37)
Albumin: 3.1 g/dL — ABNORMAL LOW (ref 3.5–5.2)
Alkaline Phosphatase: 73 U/L (ref 39–117)
Anion gap: 6 (ref 5–15)
BUN: 88 mg/dL — ABNORMAL HIGH (ref 6–23)
CALCIUM: 8.6 mg/dL (ref 8.4–10.5)
CO2: 26 mmol/L (ref 19–32)
CREATININE: 2.81 mg/dL — AB (ref 0.50–1.10)
Chloride: 105 mmol/L (ref 96–112)
GFR calc Af Amer: 16 mL/min — ABNORMAL LOW (ref >90)
GFR, EST NON AFRICAN AMERICAN: 14 mL/min — AB (ref >90)
Glucose, Bld: 145 mg/dL — ABNORMAL HIGH (ref 70–99)
Potassium: 4.3 mmol/L (ref 3.5–5.1)
Sodium: 137 mmol/L (ref 135–145)
Total Bilirubin: 1 mg/dL (ref 0.3–1.2)
Total Protein: 7.1 g/dL (ref 6.0–8.3)

## 2014-06-28 LAB — CBC WITH DIFFERENTIAL/PLATELET
BASOS PCT: 1 % (ref 0–1)
Basophils Absolute: 0 10*3/uL (ref 0.0–0.1)
EOS PCT: 4 % (ref 0–5)
Eosinophils Absolute: 0.2 10*3/uL (ref 0.0–0.7)
HEMATOCRIT: 24.7 % — AB (ref 36.0–46.0)
Hemoglobin: 8.1 g/dL — ABNORMAL LOW (ref 12.0–15.0)
LYMPHS PCT: 20 % (ref 12–46)
Lymphs Abs: 0.9 10*3/uL (ref 0.7–4.0)
MCH: 31.6 pg (ref 26.0–34.0)
MCHC: 32.8 g/dL (ref 30.0–36.0)
MCV: 96.5 fL (ref 78.0–100.0)
MONO ABS: 0.5 10*3/uL (ref 0.1–1.0)
Monocytes Relative: 11 % (ref 3–12)
Neutro Abs: 2.8 10*3/uL (ref 1.7–7.7)
Neutrophils Relative %: 64 % (ref 43–77)
Platelets: 153 10*3/uL (ref 150–400)
RBC: 2.56 MIL/uL — ABNORMAL LOW (ref 3.87–5.11)
RDW: 15.5 % (ref 11.5–15.5)
WBC: 4.3 10*3/uL (ref 4.0–10.5)

## 2014-06-28 LAB — TSH: TSH: 2.543 u[IU]/mL (ref 0.350–4.500)

## 2014-06-28 LAB — TROPONIN I: Troponin I: 0.13 ng/mL — ABNORMAL HIGH (ref <0.031)

## 2014-06-28 LAB — BRAIN NATRIURETIC PEPTIDE: B NATRIURETIC PEPTIDE 5: 1593.3 pg/mL — AB (ref 0.0–100.0)

## 2014-06-28 LAB — MAGNESIUM: MAGNESIUM: 2.6 mg/dL — AB (ref 1.5–2.5)

## 2014-06-28 LAB — APTT: APTT: 54 s — AB (ref 24–37)

## 2014-06-28 LAB — MRSA PCR SCREENING: MRSA by PCR: POSITIVE — AB

## 2014-06-28 LAB — PROTIME-INR
INR: 1.29 (ref 0.00–1.49)
Prothrombin Time: 16.2 seconds — ABNORMAL HIGH (ref 11.6–15.2)

## 2014-06-28 MED ORDER — ACETAMINOPHEN 500 MG PO TABS
500.0000 mg | ORAL_TABLET | ORAL | Status: DC
  Administered 2014-06-29 – 2014-07-12 (×15): 500 mg via ORAL
  Filled 2014-06-28 (×19): qty 1

## 2014-06-28 MED ORDER — ASPIRIN EC 81 MG PO TBEC
81.0000 mg | DELAYED_RELEASE_TABLET | Freq: Every day | ORAL | Status: DC
Start: 2014-06-28 — End: 2014-07-12
  Administered 2014-06-28 – 2014-07-12 (×15): 81 mg via ORAL
  Filled 2014-06-28 (×15): qty 1

## 2014-06-28 MED ORDER — SODIUM CHLORIDE 0.9 % IJ SOLN: 3.0000 mL | INTRAMUSCULAR | Status: DC | PRN

## 2014-06-28 MED ORDER — HEPARIN SODIUM (PORCINE) 5000 UNIT/ML IJ SOLN
5000.0000 [IU] | Freq: Three times a day (TID) | INTRAMUSCULAR | Status: DC
  Administered 2014-06-28 – 2014-07-12 (×40): 5000 [IU] via SUBCUTANEOUS
  Filled 2014-06-28 (×45): qty 1

## 2014-06-28 MED ORDER — ONDANSETRON HCL 4 MG/2ML IJ SOLN: 4.0000 mg | Freq: Four times a day (QID) | INTRAMUSCULAR | Status: DC | PRN

## 2014-06-28 MED ORDER — CARVEDILOL 3.125 MG PO TABS
3.1250 mg | ORAL_TABLET | Freq: Two times a day (BID) | ORAL | Status: DC
  Administered 2014-06-28: 3.125 mg via ORAL
  Filled 2014-06-28 (×4): qty 1

## 2014-06-28 MED ORDER — ISOSORBIDE DINITRATE 20 MG PO TABS
20.0000 mg | ORAL_TABLET | Freq: Three times a day (TID) | ORAL | Status: DC
  Administered 2014-06-28 – 2014-07-12 (×39): 20 mg via ORAL
  Filled 2014-06-28 (×44): qty 1

## 2014-06-28 MED ORDER — POTASSIUM CHLORIDE CRYS ER 20 MEQ PO TBCR
20.0000 meq | EXTENDED_RELEASE_TABLET | Freq: Two times a day (BID) | ORAL | Status: DC
  Administered 2014-06-28 – 2014-07-12 (×28): 20 meq via ORAL
  Filled 2014-06-28 (×30): qty 1

## 2014-06-28 MED ORDER — ALPRAZOLAM 0.25 MG PO TABS: 0.2500 mg | ORAL_TABLET | Freq: Two times a day (BID) | ORAL | Status: DC | PRN

## 2014-06-28 MED ORDER — SODIUM CHLORIDE 0.9 % IV SOLN: 250.0000 mL | INTRAVENOUS | Status: DC | PRN

## 2014-06-28 MED ORDER — FUROSEMIDE 10 MG/ML IJ SOLN
80.0000 mg | Freq: Two times a day (BID) | INTRAMUSCULAR | Status: DC
  Administered 2014-06-28 – 2014-06-30 (×5): 80 mg via INTRAVENOUS
  Filled 2014-06-28 (×9): qty 8

## 2014-06-28 MED ORDER — ACETAMINOPHEN 325 MG PO TABS
650.0000 mg | ORAL_TABLET | ORAL | Status: DC | PRN
  Administered 2014-07-04 – 2014-07-07 (×2): 650 mg via ORAL
  Filled 2014-06-28 (×2): qty 2

## 2014-06-28 MED ORDER — SODIUM CHLORIDE 0.9 % IJ SOLN
3.0000 mL | Freq: Two times a day (BID) | INTRAMUSCULAR | Status: DC
  Administered 2014-06-28 – 2014-07-12 (×28): 3 mL via INTRAVENOUS

## 2014-06-28 MED ORDER — HYDRALAZINE HCL 50 MG PO TABS
50.0000 mg | ORAL_TABLET | Freq: Three times a day (TID) | ORAL | Status: DC
  Administered 2014-06-28 – 2014-07-12 (×34): 50 mg via ORAL
  Filled 2014-06-28 (×45): qty 1

## 2014-06-28 MED ORDER — AMLODIPINE BESYLATE 5 MG PO TABS
5.0000 mg | ORAL_TABLET | Freq: Every day | ORAL | Status: DC
  Administered 2014-06-28 – 2014-06-29 (×2): 5 mg via ORAL
  Filled 2014-06-28 (×3): qty 1

## 2014-06-28 NOTE — Patient Instructions (Addendum)
You are being admitted [facility] for Acute Diastolic Heart Failure. [facility]. Once you check in let them know you are going to be in Room- There is valet parking at this area.

## 2014-06-28 NOTE — Progress Notes (Signed)
Cardiology Office Note   Date:  06/28/2014   ID:  Meghan ChanceJanie H Dulak, DOB 06/29/1920, MRN [id number]  PCP:  Gwynneth AlimentSANDERS,ROBYN N, MD  Cardiologist:  Dr. Allyson SabalBerry    Chief Complaint  Patient presents with  . Congestive Heart Failure    1 week. dyspnea, continues to have swelling legs, feet, abdomen      History of Present Illness: Meghan Russo is a 81 y.o. female who presents for Acute diastolic HF.  She was seen last week with edema and zaroxolyn was added.  She had gained 35 lbs since 04/14/14. She continues to be mildly dyspneic on exertion. She has no problems sleeping at night no shortness of breath that wakes her from sleep and she is not propping herself up to sleep at night. She denies any chest pain. She recently was seen [facility] by Dr. Micael [facility]-her weight was 192 on that day. Also her labs for that visit creatinine was 1.8 with a BUN of 34 he would see her back in one year. She has been taking 60 mg of Lasix in the morning and 40 in the afternoon. This has not helped her diuresing. Last week Cr. 2.47 most likely to volume overload.  She has a history of labile hypertension, negative renal Dopplers and valvular heart disease last echo 01/19/14 EF 55-60% with severe LVH, with mild AI, mild to moderate AS, moderate MR and severe TR with severe pulmonary hypertension. She was admitted for acute on chronic diastolic heart failure on 03/16/14 for one week. She had gained t that time 30 pounds and diuresed approximately 20 at discharge with a dry weight of 171 pounds.   In last week no change in wt.  Same symptoms.       Past Medical History  Diagnosis Date  . Hypertension   . Knee pain   . Edema   . Chronic kidney disease, stage III (moderate)   . Moderate aortic stenosis 12/2013  . Moderate to severe pulmonary hypertension   . Arthritis   . Cataract   . History of stress test 01/2001    Negative adequate Bruce protocol exercise stress test with scintigrahic evidence of  breast attenuation artifact with the possibility of mild anteroapical ischemia and an EF of 61% calculated by QGS.  . Bradycardia     a. 03/2014: HR 30's, sinus pauses and Wenkebach - BB discontinued.  . Wenckebach second degree AV block   . Severe tricuspid regurgitation 12/2013  . Moderate aortic insufficiency 12/2013  . Mild mitral regurgitation 12/2013  . Chronic diastolic CHF (congestive heart failure)   . Anemia     Past Surgical History  Procedure Laterality Date  . Vericose vein surgery    . Cataract extraction    . Inguinal lymph node biopsy Right 08/2011     Current Outpatient Prescriptions  Medication Sig Dispense Refill  . acetaminophen (TYLENOL) 500 MG tablet Take 500 mg by mouth every morning.    Marland Kitchen. amLODipine (NORVASC) 5 MG tablet Take 5 mg by mouth daily.     Marland Kitchen. aspirin 81 MG tablet Take 81 mg by mouth daily.    . carvedilol (COREG) 6.25 MG tablet TAKE 1 TABLET (6.25 MG TOTAL) BY MOUTH 2 (TWO) TIMES DAILY WITH A MEAL. 180 tablet 2  . furosemide (LASIX) 40 MG tablet TAKE 1 TABLET (40 MG TOTAL) BY MOUTH 2 (TWO) TIMES DAILY. (Patient taking differently: TAKES 60 MG IN THE MORNING AND 40 MG IN THE EVENING) 180 tablet  2  . hydrALAZINE (APRESOLINE) 50 MG tablet TAKE 1 TABLET (50 MG TOTAL) BY MOUTH 3 (THREE) TIMES DAILY. 270 tablet 2  . isosorbide dinitrate (ISORDIL) 20 MG tablet Take 1 tablet (20 mg total) by mouth 3 (three) times daily. 90 tablet 5  . ketoconazole (NIZORAL) 2 % cream Apply 1 application topically daily. 60 g 2  . metolazone (ZAROXOLYN) 2.5 MG tablet Take 1 tablet (2.5 mg total) by mouth daily. 30 minutes  prior to morning furosemide dose. 30 tablet 0  . nystatin (MYCOSTATIN/NYSTOP) 100000 UNIT/GM POWD Apply 1 gram topically between toes bid 60 g 3   No current facility-administered medications for this visit.    Allergies:   Asa    Social History:  The patient  reports that she has never smoked. She has never used smokeless tobacco. She reports that she  does not drink alcohol or use illicit drugs.   Family History:  The patient's family history includes Hypertension in an other family member.    ROS:  General:no colds or fevers, no weight changes with increased diuretics Skin:no rashes or ulcers HEENT:no blurred vision, no congestion CV:see HPI PUL:see HPI GI:no diarrhea constipation or melena, no indigestion GU:no hematuria, no dysuria MS:no joint pain, no claudication Neuro:no syncope, no lightheadedness Endo:no diabetes, no thyroid disease  Wt Readings from Last 3 Encounters:  06/28/14 203 lb 1.6 oz (92.126 kg)  06/22/14 203 lb 3.2 oz (92.171 kg)  04/14/14 168 lb 14.4 oz (76.613 kg)     PHYSICAL EXAM: VS:  BP 118/80 mmHg  Pulse 51  Ht  (1.626 m)  Wt 203 lb 1.6 oz (92.126 kg)  BMI 34.85 kg/m2 , BMI Body mass index is 34.85 kg/(m^2). General:Pleasant affect, NAD Skin:Warm and dry, brisk capillary refill HEENT:normocephalic, sclera clear, mucus membranes moist Neck:supple, +JVD to jaw, no bruits, no adenopathy  Heart:S1S2 RRR with premature beats with 1-2/6 systolic murmur, no gallup, rub or click Lungs:diminished breath sounds, without rales, rhonchi, or wheezes ZOX:WRUE, non tender, + BS, do not palpate liver spleen or masses Ext:4+ lower ext edema up into back,  2+ radial pulses Neuro:alert and oriented X 3, MAE, follows commands, + facial symmetry    EKG:  EKG is ordered today. The ekg ordered today demonstrates somewhat irregular but not irregularly so- SR with PACs.  HR is slow will decrease coreg.   Recent Labs: 01/20/2014: Magnesium 2.4 03/16/2014: ALT 6; Hemoglobin 9.7*; Platelets 141* 03/18/2014: Pro B Natriuretic peptide (BNP) 20357.0* 06/22/2014: BUN 80*; Creatinine 2.47*; Potassium 4.6; Sodium 137    Lipid Panel No results found for: CHOL, TRIG, HDL, CHOLHDL, VLDL, LDLCALC, LDLDIRECT     Other studies Reviewed: Additional studies/ records that were reviewed today include: labs  weight.   ASSESSMENT AND PLAN:  Acute on chronic diastolic heart failure The patient admitted to [facility] in Dec 2015 for approximately a week from 12/15 through 03/22/14 for shortness of breath, volume overload and acute on chronic diastolic heart failure. She was diuresed promptly 17 pounds. Her dry weight is approximately 168-171 pounds. She is aware of salt restriction. She is on furosemide 80 mg a day. 40 BID is how she takes. Though they tell me she has been taking 60 mg in AM and 40 in pm. Wt up 35 lbs. Today and she has difficulty ambulating. No SOB- we added zaroxolyn 2.5 mg prior to am lasix--some increase in urinary output.   I did discuss possible need for hospitalization last week with her renal  dysfunction and we will admit today without any improvement.   IV lasix and decrease her coreg.  Recheck labs. Dr. Judie Petit. Croitoru evaluated her as well.    Moderate aortic stenosis Despite does have moderate aortic stenosis with a recent 2-D echo performed 01/19/14 revealing an aortic valve area of 1 cm patient has normal LV function, severe TR with pulmonary hypertension. At this point no interventional therapy is been contemplated   Hypertension History of hypertension with blood pressure today stable. She is on hydralazine and amlodipine and coreg . Continue current meds at current dosing  CKD stage III recently seen by renal physician and stable we'll monitor basic metabolic panel with her diuresing. Most recent Cr. Is 2.47.  Bradycardia, believe this is S R with arrthymia, Dr. Judie Petit. Croitoru agrees.  Will decrease coreg to half the dose.   No OV charge will charge for admit.  Current medicines are reviewed with the patient today.  The patient Has no concerns regarding medicines.  The following changes have been made:  See above Labs/ tests ordered today include:see above  Disposition:   FU:  see above  Signed, Leone Brand, NP  06/28/2014 2:17 PM    [facility] [address] [place], [place], [place]  [address]/ [address] [place], [place] Phone: [phone]; Fax: [phone]  [phone]

## 2014-06-28 NOTE — H&P (Signed)
PCP: [name], MD Cardiologist: Dr. Allyson Sabal   Chief Complaint  Patient presents with  . Congestive Heart Failure    1 week. dyspnea, continues to have swelling legs, feet, abdomen     History of Present Illness: Meghan Russo is a 81 y.o. female who presents for Acute diastolic HF. She was seen last week with edema and zaroxolyn was added. She had gained 35 lbs since 04/14/14. She continues to be mildly dyspneic on exertion. She has no problems sleeping at night no shortness of breath that wakes her from sleep and she is not propping herself up to sleep at night. She denies any chest pain. She recently was seen at [facility] by Dr. Micael Hampshire weight was 192 on that day. Also her labs for that visit creatinine was 1.8 with a BUN of 34 he would see her back in one year. She has been taking 60 mg of Lasix in the morning and 40 in the afternoon. This has not helped her diuresing. Last week Cr. 2.47 most likely to volume overload.  She has a history of labile hypertension, negative renal Dopplers and valvular heart disease last echo 01/19/14 EF 55-60% with severe LVH, with mild AI, mild to moderate AS, moderate MR and severe TR with severe pulmonary hypertension. She was admitted for acute on chronic diastolic heart failure on 03/16/14 for one week. She had gained t that time 30 pounds and diuresed approximately 20 at discharge with a dry weight of 171 pounds.   In last week no change in wt. Same symptoms.      Past Medical History  Diagnosis Date  . Hypertension   . Knee pain   . Edema   . Chronic kidney disease, stage III (moderate)   . Moderate aortic stenosis 12/2013  . Moderate to severe pulmonary hypertension   . Arthritis   . Cataract   . History of stress test 01/2001    Negative adequate Bruce protocol exercise stress test with scintigrahic evidence of breast attenuation artifact with the possibility of mild  anteroapical ischemia and an EF of 61% calculated by QGS.  . Bradycardia     a. 03/2014: HR 30's, sinus pauses and Wenkebach - BB discontinued.  . Wenckebach second degree AV block   . Severe tricuspid regurgitation 12/2013  . Moderate aortic insufficiency 12/2013  . Mild mitral regurgitation 12/2013  . Chronic diastolic CHF (congestive heart failure)   . Anemia     Past Surgical History  Procedure Laterality Date  . Vericose vein surgery    . Cataract extraction    . Inguinal lymph node biopsy Right 08/2011     Current Outpatient Prescriptions  Medication Sig Dispense Refill  . acetaminophen (TYLENOL) 500 MG tablet Take 500 mg by mouth every morning.    Marland Kitchen amLODipine (NORVASC) 5 MG tablet Take 5 mg by mouth daily.     Marland Kitchen aspirin 81 MG tablet Take 81 mg by mouth daily.    . carvedilol (COREG) 6.25 MG tablet TAKE 1 TABLET (6.25 MG TOTAL) BY MOUTH 2 (TWO) TIMES DAILY WITH A MEAL. 180 tablet 2  . furosemide (LASIX) 40 MG tablet TAKE 1 TABLET (40 MG TOTAL) BY MOUTH 2 (TWO) TIMES DAILY. (Patient taking differently: TAKES 60 MG IN THE MORNING AND 40 MG IN THE EVENING) 180 tablet 2  . hydrALAZINE (APRESOLINE) 50 MG tablet TAKE 1 TABLET (50 MG TOTAL) BY MOUTH 3 (THREE) TIMES DAILY. 270 tablet 2  . isosorbide dinitrate (ISORDIL) 20 MG  tablet Take 1 tablet (20 mg total) by mouth 3 (three) times daily. 90 tablet 5  . ketoconazole (NIZORAL) 2 % cream Apply 1 application topically daily. 60 g 2  . metolazone (ZAROXOLYN) 2.5 MG tablet Take 1 tablet (2.5 mg total) by mouth daily. 30 minutes prior to morning furosemide dose. 30 tablet 0  . nystatin (MYCOSTATIN/NYSTOP) 100000 UNIT/GM POWD Apply 1 gram topically between toes bid 60 g 3   No current facility-administered medications for this visit.    Allergies: Asa    Social History: The patient  reports that she has never smoked. She has never  used smokeless tobacco. She reports that she does not drink alcohol or use illicit drugs.   Family History: The patient's family history includes Hypertension in an other family member.    ROS: General:no colds or fevers, no weight changes with increased diuretics Skin:no rashes or ulcers HEENT:no blurred vision, no congestion CV:see HPI PUL:see HPI GI:no diarrhea constipation or melena, no indigestion GU:no hematuria, no dysuria MS:no joint pain, no claudication Neuro:no syncope, no lightheadedness Endo:no diabetes, no thyroid disease  Wt Readings from Last 3 Encounters:  06/28/14 203 lb 1.6 oz (92.126 kg)  06/22/14 203 lb 3.2 oz (92.171 kg)  04/14/14 168 lb 14.4 oz (76.613 kg)     PHYSICAL EXAM: VS: BP 118/80 mmHg  Pulse 51  Ht 5\' 4"  (1.626 m)  Wt 203 lb 1.6 oz (92.126 kg)  BMI 34.85 kg/m2 , BMI Body mass index is 34.85 kg/(m^2). General:Pleasant affect, NAD Skin:Warm and dry, brisk capillary refill HEENT:normocephalic, sclera clear, mucus membranes moist Neck:supple, +JVD to jaw, no bruits, no adenopathy  Heart:S1S2 RRR with premature beats with 1-2/6 systolic murmur, no gallup, rub or click Lungs:diminished breath sounds, without rales, rhonchi, or wheezes ZOX:WRUEAbd:soft, non tender, + BS, do not palpate liver spleen or masses Ext:4+ lower ext edema up into back, 2+ radial pulses Neuro:alert and oriented X 3, MAE, follows commands, + facial symmetry    EKG: EKG is ordered today. The ekg ordered today demonstrates somewhat irregular but not irregularly so- SR with PACs. HR is slow will decrease coreg.   Recent Labs: 01/20/2014: Magnesium 2.4 03/16/2014: ALT 6; Hemoglobin 9.7*; Platelets 141* 03/18/2014: Pro B Natriuretic peptide (BNP) 20357.0* 06/22/2014: BUN 80*; Creatinine 2.47*; Potassium 4.6; Sodium 137    Lipid Panel  Labs (Brief)    No results found for: CHOL, TRIG, HDL, CHOLHDL, VLDL, LDLCALC, LDLDIRECT      Other studies  Reviewed: Additional studies/ records that were reviewed today include: labs weight.   ASSESSMENT AND PLAN:  Acute on chronic diastolic heart failure The patient admitted [facility] in Dec 2015 for approximately a week from 12/15 through 03/22/14 for shortness of breath, volume overload and acute on chronic diastolic heart failure. She was diuresed promptly 17 pounds. Her dry weight is approximately 168-171 pounds. She is aware of salt restriction. She is on furosemide 80 mg a day. 40 BID is how she takes. Though they tell me she has been taking 60 mg in AM and 40 in pm. Wt up 35 lbs. Today and she has difficulty ambulating. No SOB- we added zaroxolyn 2.5 mg prior to am lasix--some increase in urinary output. I did discuss possible need for hospitalization last week with her renal dysfunction and we will admit today without any improvement. IV lasix and decrease her coreg. Recheck labs. Dr. Judie PetitM. Blen Ransome evaluated her as well.    Moderate aortic stenosis Despite does have moderate aortic  stenosis with a recent 2-D echo performed 01/19/14 revealing an aortic valve area of 1 cm patient has normal LV function, severe TR with pulmonary hypertension. At this point no interventional therapy is been contemplated   Hypertension History of hypertension with blood pressure today stable. She is on hydralazine and amlodipine and coreg . Continue current meds at current dosing  CKD stage III recently seen by renal physician and stable we'll monitor basic metabolic panel with her diuresing. Most recent Cr. Is 2.47.  Bradycardia, believe this is S R with arrthymia, Dr. Judie Petit. Kristyna Bradstreet agrees. Will decrease coreg to half      I have seen and examined the patient along with Nada Boozer, NP.  I have reviewed the chart, notes and new data.  I agree with NP's note.  Despite aggressive attempts at treatment of edema with combined oral diuretics, she continues having very severe and symptomatic  fluid overload, primarily right heart failure symptoms. Will require hospitalization for intravenous diuretics with close monitoring of renal function.   Thurmon Fair, MD, [facility] [phone] 06/28/2014, 5:54 PM

## 2014-06-29 ENCOUNTER — Inpatient Hospital Stay (HOSPITAL_COMMUNITY): Payer: Medicare Other

## 2014-06-29 DIAGNOSIS — R001 Bradycardia, unspecified: Secondary | ICD-10-CM

## 2014-06-29 DIAGNOSIS — N183 Chronic kidney disease, stage 3 (moderate): Secondary | ICD-10-CM

## 2014-06-29 DIAGNOSIS — I10 Essential (primary) hypertension: Secondary | ICD-10-CM

## 2014-06-29 LAB — BASIC METABOLIC PANEL
Anion gap: 8 (ref 5–15)
BUN: 89 mg/dL — ABNORMAL HIGH (ref 6–23)
CO2: 22 mmol/L (ref 19–32)
Calcium: 8.4 mg/dL (ref 8.4–10.5)
Chloride: 107 mmol/L (ref 96–112)
Creatinine, Ser: 2.65 mg/dL — ABNORMAL HIGH (ref 0.50–1.10)
GFR calc Af Amer: 17 mL/min — ABNORMAL LOW (ref >90)
GFR calc non Af Amer: 15 mL/min — ABNORMAL LOW (ref >90)
GLUCOSE: 87 mg/dL (ref 70–99)
Potassium: 4.4 mmol/L (ref 3.5–5.1)
Sodium: 137 mmol/L (ref 135–145)

## 2014-06-29 MED ORDER — CHLORHEXIDINE GLUCONATE CLOTH 2 % EX PADS
6.0000 | MEDICATED_PAD | Freq: Every day | CUTANEOUS | Status: AC
  Administered 2014-07-01 – 2014-07-03 (×3): 6 via TOPICAL

## 2014-06-29 MED ORDER — CHLORHEXIDINE GLUCONATE CLOTH 2 % EX PADS
6.0000 | MEDICATED_PAD | Freq: Every day | CUTANEOUS | Status: DC
  Administered 2014-06-29: 6 via TOPICAL

## 2014-06-29 MED ORDER — MUPIROCIN 2 % EX OINT
1.0000 "application " | TOPICAL_OINTMENT | Freq: Two times a day (BID) | CUTANEOUS | Status: AC
  Administered 2014-06-29 – 2014-07-03 (×10): 1 via NASAL
  Filled 2014-06-29: qty 22

## 2014-06-29 NOTE — Progress Notes (Signed)
Orthopedic Tech Progress Note Patient Details:  Meghan ChanceJanie H Picado 08/13/1920 [id number]  Ortho Devices Type of Ortho Device: Roland RackUnna boot Ortho Device/Splint Location: bilateral Ortho Device/Splint Interventions: Application   Katrena Stehlin 06/29/2014, 12:30 PM

## 2014-06-29 NOTE — Consult Note (Signed)
WOC wound consult note Reason for Consult: Consult requested for bilat Una boots.  Family at bedside states she was having them changed once a week prior to admission and is followed by the outpatient wound care center. Wound type: Few patchy areas of partial thickness wounds; left lower calf .2X.2X.1cm and .2X.1X.1cm, right leg .2X.2X.1cm.  All sites with dry yellow wound beds, no odor or drainage. Periwound: Intact skin surrounding. Dressing procedure/placement/frequency: Ortho tech paged to re-apply bilat Una boots and Coban and they will plan to change Q Tuesday.  Pt can resume follow-up with the outpatient wound care center after discharge.  Discussed plan of care with family members at the bedside and they deny further questions at this time. Please re-consult if further assistance is needed.  Thank-you,  Meghan Mcgeeawn Naheem Mosco MSN, RN, CWOCN, WeldonWCN-AP, CNS [phone]

## 2014-06-29 NOTE — Progress Notes (Signed)
Ms Meghan Russo  Alert oriented, denies pain, no shortness of breath, SB on the monitor, hold am coreg, attempt to insert  foley cath x3,  unable to insert foley. MD notified. Will continue to monitor patient and I&O.

## 2014-06-29 NOTE — Consult Note (Signed)
Urology Consult   Physician requesting consult: Dr. Royann Shiversroitoru  Reason for consult: Foley placement  History of Present Illness: Meghan Russo is a 81 y.o. admitted with acute diastolic heart failure and acute on chronic renal failure, likely secondary to a pre-renal cause. No evidence of outlet obstruction or difficulty emptying, rather patient is requesting a foley due to frequent transfers to bedside commode and incontinence during diuresis for volume overload. RN unable to place catheter so Urology was called. However, when urology arrived, patient had foley in place.  She denies a history of voiding or storage urinary symptoms, hematuria, UTIs, STDs, urolithiasis, GU malignancy/trauma/surgery.  Past Medical History  Diagnosis Date  . Hypertension   . Knee pain   . Edema   . Chronic kidney disease, stage III (moderate)   . Moderate aortic stenosis 12/2013  . Moderate to severe pulmonary hypertension   . Arthritis   . Cataract   . History of stress test 01/2001    Negative adequate Bruce protocol exercise stress test with scintigrahic evidence of breast attenuation artifact with the possibility of mild anteroapical ischemia and an EF of 61% calculated by QGS.  . Bradycardia     a. 03/2014: HR 30's, sinus pauses and Wenkebach - BB discontinued.  . Wenckebach second degree AV block   . Severe tricuspid regurgitation 12/2013  . Moderate aortic insufficiency 12/2013  . Mild mitral regurgitation 12/2013  . Chronic diastolic CHF (congestive heart failure)   . Anemia     Past Surgical History  Procedure Laterality Date  . Vericose vein surgery    . Cataract extraction    . Inguinal lymph node biopsy Right 08/2011     Current Hospital Medications:  Home meds:    Medication List    ASK your doctor about these medications        acetaminophen 500 MG tablet  Commonly known as:  TYLENOL  Take 500 mg by mouth every morning.     amLODipine 5 MG tablet  Commonly known as:   NORVASC  Take 5 mg by mouth daily.     aspirin 81 MG tablet  Take 81 mg by mouth daily.     carvedilol 6.25 MG tablet  Commonly known as:  COREG  TAKE 1 TABLET (6.25 MG TOTAL) BY MOUTH 2 (TWO) TIMES DAILY WITH A MEAL.     furosemide 40 MG tablet  Commonly known as:  LASIX  TAKE 1 TABLET (40 MG TOTAL) BY MOUTH 2 (TWO) TIMES DAILY.     hydrALAZINE 50 MG tablet  Commonly known as:  APRESOLINE  TAKE 1 TABLET (50 MG TOTAL) BY MOUTH 3 (THREE) TIMES DAILY.     isosorbide dinitrate 20 MG tablet  Commonly known as:  ISORDIL  Take 1 tablet (20 mg total) by mouth 3 (three) times daily.     ketoconazole 2 % cream  Commonly known as:  NIZORAL  Apply 1 application topically daily.     metolazone 2.5 MG tablet  Commonly known as:  ZAROXOLYN  Take 1 tablet (2.5 mg total) by mouth daily. 30 minutes  prior to morning furosemide dose.     nystatin 100000 UNIT/GM Powd  Apply 1 gram topically between toes bid        Scheduled Meds: . acetaminophen  500 mg Oral BH-q7a  . amLODipine  5 mg Oral Daily  . aspirin EC  81 mg Oral Daily  . Chlorhexidine Gluconate Cloth  6 each Topical Q0600  . Chlorhexidine Gluconate Cloth  6 each Topical Q0600  . furosemide  80 mg Intravenous Q12H  . heparin  5,000 Units Subcutaneous 3 times per day  . hydrALAZINE  50 mg Oral 3 times per day  . isosorbide dinitrate  20 mg Oral TID  . mupirocin ointment  1 application Nasal BID  . potassium chloride  20 mEq Oral BID  . sodium chloride  3 mL Intravenous Q12H   Continuous Infusions:  PRN Meds:.sodium chloride, acetaminophen, ALPRAZolam, ondansetron (ZOFRAN) IV, sodium chloride  Allergies:  Allergies  Allergen Reactions  . Asa [Aspirin] Other (See Comments)    GI Upset    Family History  Problem Relation Age of Onset  . Hypertension      Social History:  reports that she has never smoked. She has never used smokeless tobacco. She reports that she does not drink alcohol or use illicit  drugs.  ROS: A complete review of systems was performed.  All systems are negative except for pertinent findings as noted.  Physical Exam:  Vital signs in last 24 hours: Temp:  [97.5 F (36.4 C)-97.9 F (36.6 C)] 97.6 F (36.4 C) (03/29 1359) Pulse Rate:  [49-76] 62 (03/29 1359) Resp:  [18] 18 (03/29 1359) BP: (107-126)/(33-45) 108/33 mmHg (03/29 1359) SpO2:  [95 %-100 %] 100 % (03/29 1359) Weight:  [90.13 kg (198 lb 11.2 oz)-90.4 kg (199 lb 4.7 oz)] 90.4 kg (199 lb 4.7 oz) (03/29 0401) Constitutional:  Alert and oriented, No acute distress Cardiovascular: Regular rate and rhythm, No JVD Respiratory: Normal respiratory effort, Lungs clear bilaterally GI: Abdomen is soft, nontender, nondistended, no abdominal masses GU: No CVA tenderness Lymphatic: No lymphadenopathy Neurologic: Grossly intact, no focal deficits Psychiatric: Normal mood and affect  Laboratory Data:   Recent Labs  06/28/14 1840  WBC 4.3  HGB 8.1*  HCT 24.7*  PLT 153     Recent Labs  06/28/14 1840 06/29/14 0500  NA 137 137  K 4.3 4.4  CL 105 107  GLUCOSE 145* 87  BUN 88* 89*  CALCIUM 8.6 8.4  CREATININE 2.81* 2.65*   I/O last 3 completed shifts: In: 270 [P.O.:270] Out: 750 [Urine:750] Total I/O In: 123 [P.O.:120; I.V.:3] Out: -      Renal Function:  Recent Labs  06/28/14 1840 06/29/14 0500  CREATININE 2.81* 2.65*   Estimated Creatinine Clearance: 14.8 mL/min (by C-G formula based on Cr of 2.65).  Radiologic Imaging: Dg Chest Port 1 View  06/29/2014   CLINICAL DATA:  81 year old female with acute shortness of Breath. Initial encounter.  EXAM: PORTABLE CHEST - 1 VIEW  COMPARISON:  03/17/2014.  FINDINGS: Portable AP semi upright view at 0521 hrs. The patient is now rotated to the right. Stable cardiomegaly and mediastinal contours. Mildly regressed bilateral veiling opacity. Pulmonary vascularity is stable with suggestion of interstitial edema. No pneumothorax. No air bronchograms  identified.  IMPRESSION: Stable cardiomegaly. Stable pulmonary interstitial edema. Right greater than left pleural effusions appear regressed since December.   Electronically Signed   By: Odessa Fleming M.D.   On: 06/29/2014 07:36    I independently reviewed the above imaging studies.  Impression/Recommendation: 44F requesting foley for bladder management during diuresis given frequent need to urinate. Initially RN unable to place catheter so Urology was called, however catheter was placed at some point prior to urology arriving.   If any further issues, please contact urology.

## 2014-06-29 NOTE — Progress Notes (Signed)
  Consult placed for urology to place foley for incontinence. RN unsuccessful after multiple attempts. [facility]. Will see later today.   Purl Claytor, Avon GullyBRITTAINY 06/29/2014

## 2014-06-29 NOTE — Progress Notes (Addendum)
Patient Profile: 81 y/o female with h/o diastolic CHF, moderate aortic stenosis by echo, HTN and CKD, admitted from the office 06/28/14 for dyspnea and volume overload secondary to acute on chronic diastolic CHF.  Dry Weight: 168-171 lb Admission Weight: 198 lb   Subjective: No major complaints. No significant dyspnea. No supplemental O2 requirements. Denies CP. She reports good UOP. She is asking for foley placement to prevent having to do frequent transfers to bedside commode/ problems with incontinence.    Objective: Vital signs in last 24 hours: Temp:  [97.5 F (36.4 C)-97.9 F (36.6 C)] 97.7 F (36.5 C) (03/29 0409) Pulse Rate:  [49-76] 76 (03/29 0409) Resp:  [18] 18 (03/29 0409) BP: (107-124)/(33-80) 107/36 mmHg (03/29 0409) SpO2:  [95 %-98 %] 98 % (03/29 0409) Weight:  [198 lb 11.2 oz (90.13 kg)-203 lb 1.6 oz (92.126 kg)] 199 lb 4.7 oz (90.4 kg) (03/29 0401) Last BM Date: 06/27/14  Intake/Output from previous day: 03/28 0701 - 03/29 0700 In: 270 [P.O.:270] Out: 750 [Urine:750] Intake/Output this shift:    Medications Current Facility-Administered Medications  Medication Dose Route Frequency Provider Last Rate Last Dose  . 0.9 %  sodium chloride infusion  250 mL Intravenous PRN Leone BrandLaura R Ingold, NP      . acetaminophen (TYLENOL) tablet 500 mg  500 mg Oral BH-q7a Leone BrandLaura R Ingold, NP   500 mg at 06/29/14 0608  . acetaminophen (TYLENOL) tablet 650 mg  650 mg Oral Q4H PRN Leone BrandLaura R Ingold, NP      . ALPRAZolam Prudy Feeler(XANAX) tablet 0.25 mg  0.25 mg Oral BID PRN Leone BrandLaura R Ingold, NP      . amLODipine (NORVASC) tablet 5 mg  5 mg Oral Daily Leone BrandLaura R Ingold, NP   5 mg at 06/28/14 1752  . aspirin EC tablet 81 mg  81 mg Oral Daily Leone BrandLaura R Ingold, NP   81 mg at 06/28/14 1753  . carvedilol (COREG) tablet 3.125 mg  3.125 mg Oral BID WC Leone BrandLaura R Ingold, NP   3.125 mg at 06/28/14 1752  . Chlorhexidine Gluconate Cloth 2 % PADS 6 each  6 each Topical Q0600 Thurmon FairMihai Croitoru, MD   6 each at 06/29/14 0524    . Chlorhexidine Gluconate Cloth 2 % PADS 6 each  6 each Topical Q0600 Mihai Croitoru, MD      . furosemide (LASIX) injection 80 mg  80 mg Intravenous Q12H Leone BrandLaura R Ingold, NP   80 mg at 06/29/14 0524  . heparin injection 5,000 Units  5,000 Units Subcutaneous 3 times per day Leone BrandLaura R Ingold, NP   5,000 Units at 06/29/14 [id number]  . hydrALAZINE (APRESOLINE) tablet 50 mg  50 mg Oral 3 times per day Leone BrandLaura R Ingold, NP   Stopped at 06/29/14 [phone]  . isosorbide dinitrate (ISORDIL) tablet 20 mg  20 mg Oral TID Leone BrandLaura R Ingold, NP   20 mg at 06/28/14 2119  . mupirocin ointment (BACTROBAN) 2 % 1 application  1 application Nasal BID Mihai Croitoru, MD      . ondansetron (ZOFRAN) injection 4 mg  4 mg Intravenous Q6H PRN Leone BrandLaura R Ingold, NP      . potassium chloride SA (K-DUR,KLOR-CON) CR tablet 20 mEq  20 mEq Oral BID Leone BrandLaura R Ingold, NP   20 mEq at 06/28/14 2119  . sodium chloride 0.9 % injection 3 mL  3 mL Intravenous Q12H Leone BrandLaura R Ingold, NP   3 mL at 06/28/14 2120  . sodium chloride 0.9 % injection 3 mL  3 mL Intravenous PRN Leone Brand, NP        PE: General appearance: alert, cooperative and no distress Neck: mildly elevated JVD Lungs: decreased BS at the bases Heart: regular rate and rhythm and 3/6 SM Extremities: 2+ bilateral LEE Pulses: 2+ and symmetric Skin: warm and dry Neurologic: Grossly normal   Filed Weights   06/28/14 1624 06/29/14 0401  Weight: 198 lb 11.2 oz (90.13 kg) 199 lb 4.7 oz (90.4 kg)   Lab Results:   Recent Labs  06/28/14 1840  WBC 4.3  HGB 8.1*  HCT 24.7*  PLT 153   BMET  Recent Labs  06/28/14 1840 06/29/14 0500  NA 137 137  K 4.3 4.4  CL 105 107  CO2 26 22  GLUCOSE 145* 87  BUN 88* 89*  CREATININE 2.81* 2.65*  CALCIUM 8.6 8.4   PT/INR  Recent Labs  06/28/14 1840  LABPROT 16.2*  INR 1.29   Cardiac Panel (last 3 results)  Recent Labs  06/28/14 1840  TROPONINI 0.13*    Assessment/Plan  Active Problems:   Acute diastolic HF (heart  failure), NYHA class 4   1. Acute on Chronic Diastolic CHF: still with peripheral edema. CXR shows stable cardiomegaly. Stable pulmonary interstitial edema. Right greater than left pleural effusions appear regressed since December. Weight is up ~30 lb beyond dry weight. I/Os not accurate secondary to issues with incontinence/ difficulty getting to beside commode in a reasonable amount of time. Patient requesting foley placement, which is reasonable given her age and will held guide diuresis. Continue IV Lasix. Will continue to monitor renal function closely. Continue daily weights, strict I/Os and low sodium diet.   2. Acute on Chronic Renal Insufficiency: Scr remains elevated but improved since admission. Baseline Scr ~2.0. Admission SCr was 2.81. Scr today is 2.65. Continue to monitor closely with daily BMPs as we are diuresing. She is on no nephrotoxic meds.   3. Moderate Aortic Stenosis: Despite does have moderate aortic stenosis with a recent 2-D echo performed 01/19/14 revealing an aortic valve area of 1 cm patient has normal LV function, severe TR with pulmonary hypertension. At this point no interventional therapy is been contemplated  4. HTN: controlled. Continue amlodipine, Coreg, Lasix, hydralazine and Imdur  5. Bradycardia: HR in the low 50s. Asymptomatic. On low dose Coreg, 3.125 mg BID. Continue to monitor on telemetry.     LOS: 1 day    Brittainy M. Delmer Islam 06/29/2014 7:50 AM  Patient seen and examined and history reviewed. Agree with above findings and plan. Patient comfortable. Denies dyspnea at rest. Reviewed telemetry. She has marked sinus brady with very long PR and I suspect some Wenkebach block. Will hold Coreg. Difficult to assess response to diuretics with incontinence. She is 30 lbs volume overloaded and will need IV lasix for some time. Nursing reports inability to pass foley x 3. Will ask Urology to place foley. Indicated to reduce patient discomfort and reduce  risk of skin breakdown.   Peter [place], MDFACC 06/29/2014 1:09 PM

## 2014-06-30 DIAGNOSIS — I350 Nonrheumatic aortic (valve) stenosis: Secondary | ICD-10-CM

## 2014-06-30 DIAGNOSIS — N184 Chronic kidney disease, stage 4 (severe): Secondary | ICD-10-CM

## 2014-06-30 LAB — BASIC METABOLIC PANEL
Anion gap: 11 (ref 5–15)
Anion gap: 12 (ref 5–15)
BUN: 89 mg/dL — ABNORMAL HIGH (ref 6–23)
BUN: 89 mg/dL — AB (ref 6–23)
CHLORIDE: 105 mmol/L (ref 96–112)
CO2: 22 mmol/L (ref 19–32)
CO2: 21 mmol/L (ref 19–32)
Calcium: 8.7 mg/dL (ref 8.4–10.5)
Calcium: 8.8 mg/dL (ref 8.4–10.5)
Chloride: 105 mmol/L (ref 96–112)
Creatinine, Ser: 2.56 mg/dL — ABNORMAL HIGH (ref 0.50–1.10)
Creatinine, Ser: 2.53 mg/dL — ABNORMAL HIGH (ref 0.50–1.10)
GFR calc Af Amer: 18 mL/min — ABNORMAL LOW (ref >90)
GFR, EST AFRICAN AMERICAN: 18 mL/min — AB (ref >90)
GFR, EST NON AFRICAN AMERICAN: 15 mL/min — AB (ref >90)
GFR, EST NON AFRICAN AMERICAN: 15 mL/min — AB (ref >90)
GLUCOSE: 81 mg/dL (ref 70–99)
Glucose, Bld: 80 mg/dL (ref 70–99)
Potassium: 4.5 mmol/L (ref 3.5–5.1)
Potassium: 4.5 mmol/L (ref 3.5–5.1)
SODIUM: 138 mmol/L (ref 135–145)
Sodium: 138 mmol/L (ref 135–145)

## 2014-06-30 MED ORDER — FUROSEMIDE 10 MG/ML IJ SOLN
80.0000 mg | Freq: Two times a day (BID) | INTRAMUSCULAR | Status: DC
  Administered 2014-07-01 – 2014-07-02 (×4): 80 mg via INTRAVENOUS
  Filled 2014-06-30 (×7): qty 8

## 2014-06-30 NOTE — Care Management Note (Signed)
    Page 1 of 1   07/12/2014     5:00:25 PM CARE MANAGEMENT NOTE 07/12/2014  Patient:  Meghan Russo,Meghan Russo   Account Number:  [id number]  Date Initiated:  06/30/2014  Documentation initiated by:  Kinsleigh Ludolph  Subjective/Objective Assessment:   Pt adm on 06/28/14 with CHF exacerbation.  PTA, pt resides at home with daughter and grandson.     Action/Plan:   Will follow for dc needs as pt progresses.  2+assist, per staff; PT/OT consults pending.   Anticipated DC Date:  07/08/2014   Anticipated DC Plan:  SKILLED NURSING FACILITY  In-house referral  Clinical Social Worker      DC Planning Services  CM consult      Choice offered to / List presented to:             Status of service:  In process, will continue to follow Medicare Important Message given?  YES (If response is "NO", the following Medicare IM given date fields will be blank) Date Medicare IM given:  07/01/2014 Medicare IM given by:  Arzella Rehmann Date Additional Medicare IM given:  07/12/2014 Additional Medicare IM given by:  Keylani Perlstein  Discharge Disposition:  SKILLED NURSING FACILITY  Per UR Regulation:  Reviewed for med. necessity/level of care/duration of stay  If discussed at Long Length of Stay Meetings, dates discussed:    Comments:  07/12/14 Sidney AceJulie Huldah Marin, RN, BSN [phone] Pt discharged to SNF today, per CSW arrangements.  07/01/14 Sidney AceJulie Dionne Rossa, RN,BSN [phone] PT recommending SNF at dc for rehab; CSW consulted to facilitate poss dc to SNF when medically stable for dc. Will follow progress.

## 2014-06-30 NOTE — Progress Notes (Signed)
Patient Profile: 81 y/o female with h/o diastolic CHF, moderate aortic stenosis by echo, HTN and CKD, admitted from the office 06/28/14 for dyspnea and volume overload secondary to acute on chronic diastolic CHF.  Dry Weight: 168-171 lb Admission Weight: 198 lb   Subjective: No major complaints. No significant dyspnea. No supplemental O2 requirements. Denies CP. Daughter is by her bedside.  Objective: Vital signs in last 24 hours: Temp:  [97.6 F (36.4 C)-98.2 F (36.8 C)] 98.2 F (36.8 C) (03/30 0636) Pulse Rate:  [58-66] 66 (03/30 0636) Resp:  [17-18] 18 (03/30 0636) BP: (108-126)/(33-44) 118/39 mmHg (03/30 0636) SpO2:  [94 %-100 %] 95 % (03/30 0636) Last BM Date: 06/28/14  Intake/Output from previous day: 03/29 0701 - 03/30 0700 In: 363 [P.O.:360; I.V.:3] Out: 1451 [Urine:1450; Stool:1] Intake/Output this shift: Total I/O In: 240 [P.O.:240] Out: -   Medications Current Facility-Administered Medications  Medication Dose Route Frequency Provider Last Rate Last Dose  . 0.9 %  sodium chloride infusion  250 mL Intravenous PRN Leone Brand, NP      . acetaminophen (TYLENOL) tablet 500 mg  500 mg Oral BH-q7a Leone Brand, NP   500 mg at 06/30/14 1914  . acetaminophen (TYLENOL) tablet 650 mg  650 mg Oral Q4H PRN Leone Brand, NP      . ALPRAZolam Prudy Feeler) tablet 0.25 mg  0.25 mg Oral BID PRN Leone Brand, NP      . amLODipine (NORVASC) tablet 5 mg  5 mg Oral Daily Leone Brand, NP   5 mg at 06/29/14 1152  . aspirin EC tablet 81 mg  81 mg Oral Daily Leone Brand, NP   81 mg at 06/29/14 1152  . Chlorhexidine Gluconate Cloth 2 % PADS 6 each  6 each Topical Q0600 Thurmon Fair, MD   6 each at 06/29/14 0524  . Chlorhexidine Gluconate Cloth 2 % PADS 6 each  6 each Topical Q0600 Thurmon Fair, MD   0 each at 06/29/14 1102  . furosemide (LASIX) injection 80 mg  80 mg Intravenous Q12H Leone Brand, NP   80 mg at 06/30/14 0630  . heparin injection 5,000 Units  5,000 Units  Subcutaneous 3 times per day Leone Brand, NP   5,000 Units at 06/29/14 2227  . hydrALAZINE (APRESOLINE) tablet 50 mg  50 mg Oral 3 times per day Leone Brand, NP   50 mg at 06/29/14 2227  . isosorbide dinitrate (ISORDIL) tablet 20 mg  20 mg Oral TID Leone Brand, NP   20 mg at 06/29/14 2227  . mupirocin ointment (BACTROBAN) 2 % 1 application  1 application Nasal BID Thurmon Fair, MD   1 application at 06/29/14 2228  . ondansetron (ZOFRAN) injection 4 mg  4 mg Intravenous Q6H PRN Leone Brand, NP      . potassium chloride SA (K-DUR,KLOR-CON) CR tablet 20 mEq  20 mEq Oral BID Leone Brand, NP   20 mEq at 06/29/14 2227  . sodium chloride 0.9 % injection 3 mL  3 mL Intravenous Q12H Leone Brand, NP   3 mL at 06/29/14 2228  . sodium chloride 0.9 % injection 3 mL  3 mL Intravenous PRN Leone Brand, NP        PE: General appearance: alert, cooperative and no distress Neck: mildly elevated JVD Lungs: decreased BS at the bases Heart: regular rate and rhythm and 3/6 SM Extremities: 2+ bilateral LEE Pulses: 2+ and symmetric Skin: warm  and dry Neurologic: Grossly normal   Filed Weights   06/28/14 1624 06/29/14 0401  Weight: 198 lb 11.2 oz (90.13 kg) 199 lb 4.7 oz (90.4 kg)   Lab Results:   Recent Labs  06/28/14 1840  WBC 4.3  HGB 8.1*  HCT 24.7*  PLT 153   BMET  Recent Labs  06/29/14 0500 06/30/14 0728 06/30/14 0756  NA 137 138 138  K 4.4 4.5 4.5  CL 107 105 105  CO2 22 22 21   GLUCOSE 87 81 80  BUN 89* 89* 89*  CREATININE 2.65* 2.56* 2.53*  CALCIUM 8.4 8.7 8.8   PT/INR  Recent Labs  06/28/14 1840  LABPROT 16.2*  INR 1.29   Cardiac Panel (last 3 results)  Recent Labs  06/28/14 1840  TROPONINI 0.13*    Assessment/Plan  Active Problems:   Acute diastolic HF (heart failure), NYHA class 4   1. Acute on Chronic Diastolic CHF: still with peripheral edema. CXR shows stable cardiomegaly. Stable pulmonary interstitial edema. Right greater than left  pleural effusions appear regressed since December. Weight was up ~30 lb beyond dry weight on admit.  No significant change in weight over the last 24 hrs. ? If bed scale is accurate. Will ask RN to check standing weights to held better guide our endpoint for diuresis. I/Os now being recorded now that foley is in place. -1.4 L in past 24 hrs. Scr remains elevated but stable. Continue IV Lasix. Will continue to monitor renal function closely. Continue daily weights, strict I/Os and low sodium diet.   2. Acute on Chronic Renal Insufficiency: Scr remains elevated but improved since admission. Baseline Scr ~2.0. Admission SCr was 2.81. Scr today is 2.63. Continue to monitor closely with daily BMPs as we are diuresing. She is on no nephrotoxic meds.   3. Moderate Aortic Stenosis: Despite that she does have moderate aortic stenosis with a recent 2-D echo performed 01/19/14 revealing an aortic valve area of 1 cm patient has normal LV function, severe TR with pulmonary hypertension. At this point no interventional therapy is been contemplated  4. HTN: controlled.   5. Bradycardia: HR in the low 50s. Asymptomatic. Coreg discontinued. Continue to monitor on telemetry.   6. Low Diastolic Pressures: recent diastolic pressures recorded in the 30s. RN advised to hold amlodipine and Imdur for now. Systolic pressures are stable in the low 100s.     LOS: 2 days    Brittainy M. Delmer IslamSimmons, PA-C 06/30/2014 10:22 AM   Patient seen and examined and history reviewed. Agree with above findings and plan. Patient feels better. Foley now in place- appreciate urology's help. Fair response to IV lasix. Negative 1.4 liters. Weight little changed. Diastolic BP is quite low. Coreg stopped yesterday due to bradycardia. Will DC amlodipine. Renal indices are stable. Continue IV diuresis. If we are not maintaining progress may need metolazone.   Peter SwazilandJordan, MDFACC 06/30/2014 1:19 PM

## 2014-07-01 LAB — BASIC METABOLIC PANEL
ANION GAP: 10 (ref 5–15)
BUN: 91 mg/dL — AB (ref 6–23)
CHLORIDE: 107 mmol/L (ref 96–112)
CO2: 21 mmol/L (ref 19–32)
CREATININE: 2.63 mg/dL — AB (ref 0.50–1.10)
Calcium: 8.6 mg/dL (ref 8.4–10.5)
GFR, EST AFRICAN AMERICAN: 17 mL/min — AB (ref >90)
GFR, EST NON AFRICAN AMERICAN: 15 mL/min — AB (ref >90)
Glucose, Bld: 97 mg/dL (ref 70–99)
Potassium: 4.8 mmol/L (ref 3.5–5.1)
Sodium: 138 mmol/L (ref 135–145)

## 2014-07-01 MED ORDER — METOLAZONE 2.5 MG PO TABS
2.5000 mg | ORAL_TABLET | Freq: Once | ORAL | Status: AC
  Administered 2014-07-01: 2.5 mg via ORAL
  Filled 2014-07-01 (×2): qty 1

## 2014-07-01 NOTE — Evaluation (Signed)
Physical Therapy Evaluation Patient Details Name: Meghan Russo MRN: [id number] DOB: 05/05/1920 Today's Date: 07/01/2014   History of Present Illness  Pt. was admitted 06/28/14 from MD office for dyspnea, volume overload.due to acute on chronic CHF.  Pt. now with acute on chronic renal insufficiency and low diastolic pressures.  Pt. with h/o labilie HTN, valvular heart diaease, EF 55-60%, severe LVH, aortic stenosis,bradycardia.    Clinical Impression  Pt. Presents to PT with severe weakness and a significant decline in her functional mobility with current acute illness.   She will benefit from acute PT to address these and the below problem areas.  Her daughter is concerned that she cannot manage pt. At her current functional level.  Pt. Needs post acute rehabilitation at SNF level to be able to regain enough strength for return home. Pt. And daughter bothe in agreement with this.   I discussed this with Sidney AceJulie Amerson, CM .    Follow Up Recommendations SNF;Supervision/Assistance - 24 hour    Equipment Recommendations  None recommended by PT    Recommendations for Other Services       Precautions / Restrictions Precautions Precautions: Fall Restrictions Weight Bearing Restrictions: No      Mobility  Bed Mobility Overal bed mobility: Needs Assistance Bed Mobility: Supine to Sit     Supine to sit: Max assist     General bed mobility comments: needed assist to bring LEs to EOB and for elevating trunk to sitting position  Transfers Overall transfer level: Needs assistance Equipment used: Rolling walker (2 wheeled) Transfers: Sit to/from [employer] CorporationStand;Stand Pivot Transfers Sit to Stand: Max assist;+2 physical assistance Stand pivot transfers: Max assist;+2 physical assistance       General transfer comment: Pt. with difficulty keeping LEs underneath her, needed blocking of her feet.  Pt. needed max of 2 assist to rise to stand and to pivot to drop arm recliner  chair  Ambulation/Gait Ambulation/Gait assistance:  (pt. too weak to ambulate)              Stairs            Wheelchair Mobility    Modified Rankin (Stroke Patients Only)       Balance Overall balance assessment: Needs assistance Sitting-balance support: No upper extremity supported;Feet supported Sitting balance-Leahy Scale: Fair Sitting balance - Comments: unable to shift weight in sitting   Standing balance support: Bilateral upper extremity supported;During functional activity Standing balance-Leahy Scale: Zero                               Pertinent Vitals/Pain Pain Assessment: No/denies pain    Home Living Family/patient expects to be discharged to:: Private residence Living Arrangements: Children;Other (Comment) (lives with her daughter Meghan Russo and grandson) Available Help at Discharge: Family;Available 24 hours/day Type of Home: House Home Access: Level entry     Home Layout: One level Home Equipment: Walker - standard;Cane - single point;Bedside commode Additional Comments: Daughter expressing she feels she cannot physically care for her mom as weak as she is currently    Prior Function Level of Independence: Needs assistance;Independent with assistive device(s)   Gait / Transfers Assistance Needed: Gilmer MorCane primarily for ambulation, occasional use of standard walker.  ADL's / Homemaking Assistance Needed: Rarely cooks, can bath/dress herself        Hand Dominance        Extremity/Trunk Assessment   Upper Extremity Assessment: Generalized weakness  Lower Extremity Assessment: Generalized weakness      Cervical / Trunk Assessment: Normal  Communication   Communication: HOH  Cognition Arousal/Alertness: Awake/alert Behavior During Therapy: WFL for tasks assessed/performed Overall Cognitive Status: Within Functional Limits for tasks assessed                      General Comments      Exercises         Assessment/Plan    PT Assessment Patient needs continued PT services  PT Diagnosis Difficulty walking;Generalized weakness   PT Problem List Decreased strength;Decreased activity tolerance;Decreased balance;Decreased mobility;Decreased knowledge of use of DME;Cardiopulmonary status limiting activity  PT Treatment Interventions DME instruction;Gait training;Functional mobility training;Therapeutic activities;Therapeutic exercise;Balance training;Patient/family education   PT Goals (Current goals can be found in the Care Plan section) Acute Rehab PT Goals Patient Stated Goal: get stronger before home PT Goal Formulation: With patient Time For Goal Achievement: 07/15/14 Potential to Achieve Goals: Fair    Frequency Min 3X/week   Barriers to discharge Decreased caregiver support Pt.'s daughter has been caring for pt., however now pt. needs +2 max assist and daughter cannot manage    Co-evaluation               End of Session Equipment Utilized During Treatment: Gait belt Activity Tolerance: Patient limited by fatigue Patient left: in chair;with call bell/phone within reach;with family/visitor present;Other (comment) (daughter Meghan Mccreedy present) Nurse Communication: Mobility status;Need for lift equipment;Other (comment) (at least +2 for back to bed; possibly lift equipment )         Time: 1040-1107 PT Time Calculation (min) (ACUTE ONLY): 27 min   Charges:   PT Evaluation $Initial PT Evaluation Tier I: 1 Procedure PT Treatments $Therapeutic Activity: 8-22 mins   PT G Codes:        Ferman Hamming 07/01/2014, 11:30 AM Weldon Picking PT Acute Rehab Services [phone] Beeper [phone]

## 2014-07-01 NOTE — Progress Notes (Signed)
OT Cancellation Note  Patient Details Name: Loleta ChanceJanie H Stoneman MRN: [id number] DOB: 03/05/1921   Cancelled Treatment:    Reason Eval/Treat Not Completed: Fatigue/lethargy limiting ability to participate. Pt fast asleep and daughter in room with pt stating that it had not been long since they had gotten her back to bed. Will re-attempt eval in AM.  Evette GeorgesLeonard, Janai Maudlin Eva [phone] 07/01/2014, 3:33 PM

## 2014-07-01 NOTE — Progress Notes (Signed)
Subjective: No significant dyspnea. No supplemental O2 requirements. Denies CP. Daughter is by her bedside.  She does have some pain in her knees and new Right arm swelling that started last night.   Objective: Vital signs in last 24 hours: Temp:  [98 F (36.7 C)-99.3 F (37.4 C)] 99.3 F (37.4 C) (03/31 1012) Pulse Rate:  [52-64] 58 (03/31 1012) Resp:  [18] 18 (03/31 1012) BP: (117-146)/(29-41) 122/35 mmHg (03/31 1012) SpO2:  [95 %-96 %] 95 % (03/31 1012) Weight:  [197 lb 8 oz (89.585 kg)-198 lb 10.2 oz (90.1 kg)] 197 lb 8 oz (89.585 kg) (03/31 0529) Last BM Date: 06/28/14  Intake/Output from previous day: 03/30 0701 - 03/31 0700 In: 900 [P.O.:900] Out: 1175 [Urine:1175] Intake/Output this shift:    Medications Current Facility-Administered Medications  Medication Dose Route Frequency Provider Last Rate Last Dose  . 0.9 %  sodium chloride infusion  250 mL Intravenous PRN Leone BrandLaura R Ingold, NP      . acetaminophen (TYLENOL) tablet 500 mg  500 mg Oral BH-q7a Leone BrandLaura R Ingold, NP   500 mg at 07/01/14 [phone]  . acetaminophen (TYLENOL) tablet 650 mg  650 mg Oral Q4H PRN Leone BrandLaura R Ingold, NP      . ALPRAZolam Prudy Feeler(XANAX) tablet 0.25 mg  0.25 mg Oral BID PRN Leone BrandLaura R Ingold, NP      . aspirin EC tablet 81 mg  81 mg Oral Daily Leone BrandLaura R Ingold, NP   81 mg at 07/01/14 1014  . Chlorhexidine Gluconate Cloth 2 % PADS 6 each  6 each Topical Q0600 Mihai Croitoru, MD   6 each at 07/01/14 0547  . furosemide (LASIX) injection 80 mg  80 mg Intravenous BID Mihai Croitoru, MD   80 mg at 07/01/14 0807  . heparin injection 5,000 Units  5,000 Units Subcutaneous 3 times per day Leone BrandLaura R Ingold, NP   5,000 Units at 07/01/14 [phone]  . hydrALAZINE (APRESOLINE) tablet 50 mg  50 mg Oral 3 times per day Leone BrandLaura R Ingold, NP   50 mg at 06/30/14 2200  . isosorbide dinitrate (ISORDIL) tablet 20 mg  20 mg Oral TID Leone BrandLaura R Ingold, NP   20 mg at 07/01/14 1014  . mupirocin ointment (BACTROBAN) 2 % 1 application  1 application  Nasal BID Thurmon FairMihai Croitoru, MD   1 application at 07/01/14 1014  . ondansetron (ZOFRAN) injection 4 mg  4 mg Intravenous Q6H PRN Leone BrandLaura R Ingold, NP      . potassium chloride SA (K-DUR,KLOR-CON) CR tablet 20 mEq  20 mEq Oral BID Leone BrandLaura R Ingold, NP   20 mEq at 07/01/14 1014  . sodium chloride 0.9 % injection 3 mL  3 mL Intravenous Q12H Leone BrandLaura R Ingold, NP   3 mL at 07/01/14 1014  . sodium chloride 0.9 % injection 3 mL  3 mL Intravenous PRN Leone BrandLaura R Ingold, NP        PE: General appearance: alert, cooperative and no distress Neck: mildly elevated JVD Lungs: decreased BS at the bases Heart: regular rate and rhythm and 3/6 SM Extremities: 2+ bilateral LEE. Wrapped in unna boots. Right arm swelling. No erythema but warm to touch.  Pulses: 2+ and symmetric Skin: warm and dry Neurologic: Grossly normal   Filed Weights   06/29/14 0401 06/30/14 1112 07/01/14 0529  Weight: 199 lb 4.7 oz (90.4 kg) 198 lb 10.2 oz (90.1 kg) 197 lb 8 oz (89.585 kg)   Lab Results:   Recent Labs  06/28/14 1840  WBC 4.3  HGB 8.1*  HCT 24.7*  PLT 153   BMET  Recent Labs  06/30/14 0728 06/30/14 0756 07/01/14 0421  NA 138 138 138  K 4.5 4.5 4.8  CL 105 105 107  CO2 GLUCOSE 81 80 97  BUN 89* 89* 91*  CREATININE 2.56* 2.53* 2.63*  CALCIUM 8.7 8.8 8.6   PT/INR  Recent Labs  06/28/14 1840  LABPROT 16.2*  INR 1.29   Cardiac Panel (last 3 results)  Recent Labs  06/28/14 1840  TROPONINI 0.13*    Assessment/Plan  Active Problems:   Acute diastolic HF (heart failure), NYHA class 4  81 y/o female with h/o diastolic CHF, moderate aortic stenosis by echo, HTN and CKD, admitted from the office 06/28/14 for dyspnea and volume overload secondary to acute on chronic diastolic CHF.  Dry Weight: 168-171 lb Admission Weight: 198 lb  1. Acute on Chronic Diastolic CHF: still with peripheral edema. CXR shows stable cardiomegaly. Stable pulmonary interstitial edema. Right greater than left pleural  effusions appear regressed since December. Weight was up ~30 lb beyond dry weight on admit.  No significant change in weight over the last 24 hrs. ? If bed scale is accurate. Her current weight is 197. -- I/Os now being recorded now that foley is in place. -1.8 L since admission. Scr remains elevated but stable. Continue IV Lasix. Will continue to monitor renal function closely.  -- She is currently on IV lasix  BID. We will likely need to add an adjunctive diuretic  like metolazone. Will defer to MD  -- Continue daily weights, strict I/Os and low sodium diet.   2. Acute on Chronic Renal Insufficiency: Scr remains elevated but improved since admission. Baseline Scr ~2.0. Admission SCr was 2.81. Scr today is 2.63. Continue to monitor closely with daily BMPs as we are diuresing. She is on no nephrotoxic meds.   3. Moderate Aortic Stenosis: Despite that she does have moderate aortic stenosis with a recent 2-D echo performed 01/19/14 revealing an aortic valve area of 1 cm patient has normal LV function, severe TR with pulmonary hypertension. At this point no interventional therapy is been contemplated  4. HTN/Low Diastolic Pressures: recent diastolic pressures recorded in the 30s. Amlodipine held. Systolic pressures are stable.  5. Bradycardia: HR in the low 50s. Asymptomatic. Coreg discontinued. Continue to monitor on telemetry.   6. Right arm swelling- new swelling in her R arm that occurred overnight. No pain or erythema. Warm to the touch.  7. Leg pain- patient currently with Unna Boots for chronic LE wounds. Wound care re-wrapped and now she is having pain in her knees. ? Maybe too tight? Will have them see her today.     LOS: 3 days    Cline Crock , PA-C 07/01/2014 11:01 AM   Patient seen, examined. Available data reviewed. Agree with findings, assessment, and plan as outlined by Carlean Jews, PA-C. Exam reveals frail, elderly woman in NAD. She has anasarca with R>L arm edema.  Legs are now wrapped. Heart RRR with 3/6 systolic murmur at LSB, lungs clear.   Still with significant volume overload. Will add a single dose of metolazone 2.5 mg before PM dose of IV lasix today. Have to watch her renal fxn closely - will repeat a BMET in am. May be difficult to make much more progress with limitation of advanced age and renal dysfunction.  Tonny Bollman, M.D. 07/01/2014 2:01 PM

## 2014-07-02 ENCOUNTER — Encounter (HOSPITAL_BASED_OUTPATIENT_CLINIC_OR_DEPARTMENT_OTHER): Payer: Medicare Other | Attending: Internal Medicine

## 2014-07-02 DIAGNOSIS — N184 Chronic kidney disease, stage 4 (severe): Secondary | ICD-10-CM | POA: Diagnosis present

## 2014-07-02 LAB — BASIC METABOLIC PANEL
Anion gap: 6 (ref 5–15)
BUN: 95 mg/dL — AB (ref 6–23)
CHLORIDE: 107 mmol/L (ref 96–112)
CO2: 25 mmol/L (ref 19–32)
Calcium: 8.6 mg/dL (ref 8.4–10.5)
Creatinine, Ser: 2.69 mg/dL — ABNORMAL HIGH (ref 0.50–1.10)
GFR calc Af Amer: 17 mL/min — ABNORMAL LOW (ref >90)
GFR, EST NON AFRICAN AMERICAN: 14 mL/min — AB (ref >90)
Glucose, Bld: 86 mg/dL (ref 70–99)
Potassium: 4.6 mmol/L (ref 3.5–5.1)
Sodium: 138 mmol/L (ref 135–145)

## 2014-07-02 MED ORDER — METOLAZONE 2.5 MG PO TABS
2.5000 mg | ORAL_TABLET | Freq: Once | ORAL | Status: AC
  Administered 2014-07-02: 2.5 mg via ORAL
  Filled 2014-07-02: qty 1

## 2014-07-02 NOTE — Progress Notes (Signed)
Orthopedic Tech Progress Note Patient Details:  Meghan Russo Faison 06/07/1920 [id number] Checked on patient's unna boots due to complaints of wrap being too tight. Loosened wrapping around ankle where patient said she was bothered. Patient reported that wraps felt better. Patient and family instructed to contact nursing should she have any more problems with unna boots. Nursing to contact Ortho tech at that time and unna boot will be taken off and reapplied if need be.  Patient ID: Meghan Russo Jun, female   DOB: 08/15/1920, 81 y.o.   MRN: [id number]   Orie Routsia R Thompson 07/02/2014, 8:12 AM

## 2014-07-02 NOTE — Progress Notes (Signed)
Called and spoke with ortho tech. She is coming up to the floor to check and see if unna boots are to tight for patient. Patient stated that she feels like they are to tight. .Marland Kitchen

## 2014-07-02 NOTE — Clinical Social Work Note (Signed)
CSW attempted to see patient, patient was sleeping and did not want to wake up.  Will attempt to complete assessment later today or have weekend CSW see patient.  Ervin KnackEric R. Blakely Maranan, MSW, Theresia MajorsLCSWA [phone] 07/02/2014 2:56 PM

## 2014-07-02 NOTE — Progress Notes (Signed)
Subjective: No significant dyspnea. No supplemental O2 requirements. Denies CP. Family is by her bedside.  Legs feel better after adjustment in wraps yesterday.  Objective: Vital signs in last 24 hours: Temp:  [97.7 F (36.5 C)-99.3 F (37.4 C)] 97.7 F (36.5 C) (04/01 0617) Pulse Rate:  [58-68] 61 (04/01 0617) Resp:  [16-18] 18 (04/01 0617) BP: (122-136)/(35-39) 133/39 mmHg (04/01 0617) SpO2:  [95 %-100 %] 97 % (04/01 0617) Last BM Date: 07/01/14  Intake/Output from previous day: 03/31 0701 - 04/01 0700 In: 720 [P.O.:720] Out: 1275 [Urine:1275] Intake/Output this shift:    Medications Current Facility-Administered Medications  Medication Dose Route Frequency Provider Last Rate Last Dose  . 0.9 %  sodium chloride infusion  250 mL Intravenous PRN Leone Brand, NP      . acetaminophen (TYLENOL) tablet 500 mg  500 mg Oral BH-q7a Leone Brand, NP   500 mg at 07/02/14 0615  . acetaminophen (TYLENOL) tablet 650 mg  650 mg Oral Q4H PRN Leone Brand, NP      . ALPRAZolam Prudy Feeler) tablet 0.25 mg  0.25 mg Oral BID PRN Leone Brand, NP      . aspirin EC tablet 81 mg  81 mg Oral Daily Leone Brand, NP   81 mg at 07/01/14 1014  . Chlorhexidine Gluconate Cloth 2 % PADS 6 each  6 each Topical Q0600 Mihai Croitoru, MD   6 each at 07/02/14 0616  . furosemide (LASIX) injection 80 mg  80 mg Intravenous BID Mihai Croitoru, MD   80 mg at 07/01/14 1733  . heparin injection 5,000 Units  5,000 Units Subcutaneous 3 times per day Leone Brand, NP   5,000 Units at 07/02/14 4098  . hydrALAZINE (APRESOLINE) tablet 50 mg  50 mg Oral 3 times per day Leone Brand, NP   50 mg at 07/02/14 0725  . isosorbide dinitrate (ISORDIL) tablet 20 mg  20 mg Oral TID Leone Brand, NP   20 mg at 07/01/14 2143  . mupirocin ointment (BACTROBAN) 2 % 1 application  1 application Nasal BID Thurmon Fair, MD   1 application at 07/01/14 2144  . ondansetron (ZOFRAN) injection 4 mg  4 mg Intravenous Q6H PRN Leone Brand, NP      . potassium chloride SA (K-DUR,KLOR-CON) CR tablet 20 mEq  20 mEq Oral BID Leone Brand, NP   20 mEq at 07/01/14 2143  . sodium chloride 0.9 % injection 3 mL  3 mL Intravenous Q12H Leone Brand, NP   3 mL at 07/01/14 2144  . sodium chloride 0.9 % injection 3 mL  3 mL Intravenous PRN Leone Brand, NP        PE: General appearance: alert, cooperative and no distress Neck: mildly elevated JVD Lungs: decreased BS at the bases Heart: regular rate and rhythm and 3/6 SM Extremities: 2+ bilateral LEE. Wrapped in unna boots. Still with brawny edema above the knees.  Pulses: 2+ and symmetric Skin: warm and dry Neurologic: Grossly normal   Filed Weights   06/29/14 0401 06/30/14 1112 07/01/14 0529  Weight: 199 lb 4.7 oz (90.4 kg) 198 lb 10.2 oz (90.1 kg) 197 lb 8 oz (89.585 kg)   Lab Results:  No results for input(s): WBC, HGB, HCT, PLT in the last 72 hours. BMET  Recent Labs  06/30/14 0756 07/01/14 0421 07/02/14 0437  NA 138 138 138  K 4.5 4.8 4.6  CL 105 107 107  CO2 21 21 25   GLUCOSE 80 97 86  BUN 89* 91* 95*  CREATININE 2.53* 2.63* 2.69*  CALCIUM 8.8 8.6 8.6   PT/INR No results for input(s): LABPROT, INR in the last 72 hours. Cardiac Panel (last 3 results) No results for input(s): CKTOTAL, CKMB, TROPONINI, RELINDX in the last 72 hours.  Assessment/Plan  Active Problems:   Acute diastolic HF (heart failure), NYHA class 824  81 y/o female with h/o diastolic CHF, moderate aortic stenosis by echo, HTN and CKD, admitted from the office 06/28/14 for dyspnea and volume overload secondary to acute on chronic diastolic CHF.  Dry Weight: 168-171 lb Admission Weight: 198 lb  1. Acute on Chronic Diastolic CHF: still with  edema. CXR shows stable cardiomegaly. Stable pulmonary interstitial edema. Right greater than left pleural effusions appear regressed since December. Weight was up ~30 lb beyond dry weight on admit.  Weight not yet recorded today.  -- I/Os now  being recorded now that foley is in place. -2.3 L since admission. Scr increased some but fairly stable. Response to diuretics is not very robust. Seems to have a little better response with metolazone yesterday pm.  --Continue lasix IV. Add metolazone 2.5 mg today. -- Continue daily weights, strict I/Os and low sodium diet.  -- progress limited by advanced age and renal dysfunction.   2. Acute on Chronic Renal Insufficiency: Scr remains elevated but improved since admission. Baseline Scr ~2.0. Admission SCr was 2.81. Scr today is 2.69 with BUN 95. Continue to monitor closely with daily BMPs as we are diuresing. She is on no nephrotoxic meds.   3. Moderate Aortic Stenosis: Despite that she does have moderate aortic stenosis with a recent 2-D echo performed 01/19/14 revealing an aortic valve area of 1 cm patient has normal LV function, severe TR with pulmonary hypertension. At this point no interventional therapy is been contemplated  4. HTN/Low Diastolic Pressures: recent diastolic pressures recorded in the 30s. Amlodipine held. Systolic pressures are stable.  5. Bradycardia: HR in the low 50s. Asymptomatic. Coreg discontinued. Continue to monitor on telemetry.   6. Right arm swelling- improved.      LOS: 4 days   Kamari Bilek [place] MD, Co[facility] Basin Medical CenterFACC   07/02/2014 9:27 AM

## 2014-07-02 NOTE — Evaluation (Signed)
Occupational Therapy Evaluation Patient Details Name: Meghan Russo MRN: [id number] DOB: July 09, 1920 Today's Date: 07/02/2014    History of Present Illness Pt. was admitted 06/28/14 from MD office for dyspnea, volume overload.due to acute on chronic CHF.  Pt. now with acute on chronic renal insufficiency and low diastolic pressures.  Pt. with h/o labilie HTN, valvular heart diaease, EF 55-60%, severe LVH, aortic stenosis,bradycardia.     Clinical Impression   Pt demonstrates decline in function and safety with ADLs and ADL mobility with decreased strength, balance and endurance. Pt would benefit from acute OT services to address impairments to increase level of function and safety    Follow Up Recommendations  SNF;Supervision/Assistance - 24 hour    Equipment Recommendations  None recommended by OT (TBD at next venue of care)    Recommendations for Other Services       Precautions / Restrictions Precautions Precautions: Fall Restrictions Weight Bearing Restrictions: No      Mobility Bed Mobility Overal bed mobility: Needs Assistance Bed Mobility: Supine to Sit     Supine to sit: Max assist     General bed mobility comments: needed assist to bring LEs to EOB and for elevating trunk to sitting position  Transfers Overall transfer level: Needs assistance Equipment used: Rolling walker (2 wheeled) Transfers: Sit to/from [employer] Sit to Stand: Max assist;+2 physical assistance Stand pivot transfers: Max assist;+2 physical assistance            Balance   Sitting-balance support: No upper extremity supported;Feet supported Sitting balance-Leahy Scale: Fair     Standing balance support: Bilateral upper extremity supported;During functional activity Standing balance-Leahy Scale: Poor                              ADL Overall ADL's : Needs assistance/impaired     Grooming: Wash/dry hands;Wash/dry face;Sitting;Min guard   Upper Body  Bathing: Minimal assitance;Sitting   Lower Body Bathing: Total assistance   Upper Body Dressing : Minimal assistance;Sitting   Lower Body Dressing: Total assistance   Toilet Transfer: Maximal assistance;+2 for physical assistance   Toileting- Clothing Manipulation and Hygiene: Total assistance;Sit to/from stand;+2 for physical assistance       Functional mobility during ADLs: +2 for physical assistance;Maximal assistance       Vision  wears reading glasses   Perception Perception Perception Tested?: No   Praxis Praxis Praxis tested?: Not tested    Pertinent Vitals/Pain Pain Assessment: No/denies pain     Hand Dominance Right   Extremity/Trunk Assessment     Lower Extremity Assessment Lower Extremity Assessment: Defer to PT evaluation   Cervical / Trunk Assessment Cervical / Trunk Assessment: Normal   Communication Communication Communication: HOH   Cognition Arousal/Alertness: Awake/alert Behavior During Therapy: WFL for tasks assessed/performed Overall Cognitive Status: Within Functional Limits for tasks assessed                     General Comments   pt pleasant and cooperative, family supportive                 Home Living Family/patient expects to be discharged to:: Private residence Living Arrangements: Children Available Help at Discharge: Family;Available 24 hours/day Type of Home: House Home Access: Level entry     Home Layout: One level     Bathroom Shower/Tub: Chief Strategy Officer: Standard Bathroom Accessibility: Yes   Home Equipment: Walker - standard;Cane -  single point;Bedside commode   Additional Comments: Daughter expressing she feels she cannot physically care for her mom as weak as she is currently      Prior Functioning/Environment Level of Independence: Needs assistance;Independent with assistive device(s)  Gait / Transfers Assistance Needed: Gilmer MorCane primarily for ambulation, occasional use of  standard walker. ADL's / Homemaking Assistance Needed: Rarely cooks, can bath/dress herself        OT Diagnosis: Generalized weakness   OT Problem List: Decreased strength;Decreased knowledge of use of DME or AE;Decreased activity tolerance;Impaired balance (sitting and/or standing)   OT Treatment/Interventions: Self-care/ADL training;Therapeutic exercise;Patient/family education;Therapeutic activities;DME and/or AE instruction    OT Goals(Current goals can be found in the care plan section) Acute Rehab OT Goals Patient Stated Goal: get stronger before home OT Goal Formulation: With patient/family Time For Goal Achievement: 07/09/14 Potential to Achieve Goals: Good ADL Goals Pt Will Perform Grooming: with supervision;with set-up;sitting Pt Will Perform Upper Body Bathing: with min guard assist;sitting Pt Will Perform Lower Body Bathing: with max assist;sitting/lateral leans Pt Will Perform Upper Body Dressing: with min guard assist;sitting Pt Will Transfer to Toilet: with mod assist;with max assist;bedside commode Additional ADL Goal #1: Pt will comlpete bed mobility with mod A to sit EOB in prep for ADLs  OT Frequency: Min 2X/week   Barriers to D/C: Decreased caregiver support                        End of Session Equipment Utilized During Treatment: Rolling walker;Gait belt  Activity Tolerance: Patient limited by fatigue Patient left: in chair;with call bell/phone within reach;with nursing/sitter in room;with family/visitor present   Time: 0920-0954 OT Time Calculation (min): 34 min Charges:  OT General Charges $OT Visit: 1 Procedure OT Evaluation $Initial OT Evaluation Tier I: 1 Procedure OT Treatments $Self Care/Home Management : 8-22 mins $Therapeutic Activity: 8-22 mins G-Codes:    Galen ManilaSpencer, Deray Dawes Jeanette 07/02/2014, 1:07 PM

## 2014-07-03 ENCOUNTER — Encounter (HOSPITAL_COMMUNITY): Payer: Self-pay | Admitting: *Deleted

## 2014-07-03 DIAGNOSIS — I351 Nonrheumatic aortic (valve) insufficiency: Secondary | ICD-10-CM

## 2014-07-03 DIAGNOSIS — I5033 Acute on chronic diastolic (congestive) heart failure: Secondary | ICD-10-CM

## 2014-07-03 DIAGNOSIS — I509 Heart failure, unspecified: Secondary | ICD-10-CM

## 2014-07-03 DIAGNOSIS — I110 Hypertensive heart disease with heart failure: Secondary | ICD-10-CM

## 2014-07-03 DIAGNOSIS — M79601 Pain in right arm: Secondary | ICD-10-CM

## 2014-07-03 LAB — BASIC METABOLIC PANEL
ANION GAP: 10 (ref 5–15)
BUN: 95 mg/dL — AB (ref 6–23)
CO2: 23 mmol/L (ref 19–32)
Calcium: 8.6 mg/dL (ref 8.4–10.5)
Chloride: 106 mmol/L (ref 96–112)
Creatinine, Ser: 2.56 mg/dL — ABNORMAL HIGH (ref 0.50–1.10)
GFR, EST AFRICAN AMERICAN: 18 mL/min — AB (ref >90)
GFR, EST NON AFRICAN AMERICAN: 15 mL/min — AB (ref >90)
GLUCOSE: 111 mg/dL — AB (ref 70–99)
Potassium: 5 mmol/L (ref 3.5–5.1)
Sodium: 139 mmol/L (ref 135–145)

## 2014-07-03 MED ORDER — METOLAZONE 2.5 MG PO TABS
2.5000 mg | ORAL_TABLET | Freq: Once | ORAL | Status: AC
  Administered 2014-07-03: 2.5 mg via ORAL
  Filled 2014-07-03: qty 1

## 2014-07-03 MED ORDER — FUROSEMIDE 10 MG/ML IJ SOLN
60.0000 mg | Freq: Three times a day (TID) | INTRAMUSCULAR | Status: DC
  Administered 2014-07-03 – 2014-07-05 (×7): 60 mg via INTRAVENOUS
  Filled 2014-07-03 (×6): qty 6

## 2014-07-03 NOTE — Progress Notes (Addendum)
Patient stated her back was hurting from lying in the bed.  3x assist to chair.  Patient resting comfortably in chair.  Will continue to monitor.   0600-- patient moved back to bed as chair got uncomfortable.  Patient has a lot of pain in legs and arms.  Does not like to move and resists.  3x assist.

## 2014-07-03 NOTE — Progress Notes (Signed)
SUBJECTIVE: Denies chest pain and shortness of breath. Has diffuse extremity pain in arms and legs.     Intake/Output Summary (Last 24 hours) at 07/03/14 1007 Last data filed at 07/03/14 0914  Gross per 24 hour  Intake    700 ml  Output   3025 ml  Net  -2325 ml    Current Facility-Administered Medications  Medication Dose Route Frequency Provider Last Rate Last Dose  . 0.9 %  sodium chloride infusion  250 mL Intravenous PRN Leone Brand, NP      . acetaminophen (TYLENOL) tablet 500 mg  500 mg Oral BH-q7a Leone Brand, NP   500 mg at 07/03/14 0559  . acetaminophen (TYLENOL) tablet 650 mg  650 mg Oral Q4H PRN Leone Brand, NP      . ALPRAZolam Prudy Feeler) tablet 0.25 mg  0.25 mg Oral BID PRN Leone Brand, NP      . aspirin EC tablet 81 mg  81 mg Oral Daily Leone Brand, NP   81 mg at 07/02/14 1032  . Chlorhexidine Gluconate Cloth 2 % PADS 6 each  6 each Topical Q0600 Thurmon Fair, MD   6 each at 07/03/14 0558  . furosemide (LASIX) injection 80 mg  80 mg Intravenous BID Mihai Croitoru, MD   80 mg at 07/02/14 1800  . heparin injection 5,000 Units  5,000 Units Subcutaneous 3 times per day Leone Brand, NP   5,000 Units at 07/03/14 0558  . hydrALAZINE (APRESOLINE) tablet 50 mg  50 mg Oral 3 times per day Leone Brand, NP   50 mg at 07/03/14 0558  . isosorbide dinitrate (ISORDIL) tablet 20 mg  20 mg Oral TID Leone Brand, NP   20 mg at 07/02/14 2157  . mupirocin ointment (BACTROBAN) 2 % 1 application  1 application Nasal BID Thurmon Fair, MD   1 application at 07/02/14 2158  . ondansetron (ZOFRAN) injection 4 mg  4 mg Intravenous Q6H PRN Leone Brand, NP      . potassium chloride SA (K-DUR,KLOR-CON) CR tablet 20 mEq  20 mEq Oral BID Leone Brand, NP   20 mEq at 07/02/14 2157  . sodium chloride 0.9 % injection 3 mL  3 mL Intravenous Q12H Leone Brand, NP   3 mL at 07/02/14 2158  . sodium chloride 0.9 % injection 3 mL  3 mL Intravenous PRN Leone Brand, NP          Filed Vitals:   07/02/14 1340 07/02/14 1353 07/02/14 2324 07/03/14 0619  BP: 130/43  114/37 141/43  Pulse: 74  58 62  Temp: 98 F (36.7 C)  98.4 F (36.9 C) 97.7 F (36.5 C)  TempSrc: Oral  Oral Oral  Resp: Height:      Weight:  195 lb 12.3 oz (88.8 kg)  189 lb 8 oz (85.957 kg)  SpO2: 98%  96% 97%    PHYSICAL EXAM General: NAD, elderly. HEENT: Normal. Neck: +JVD.  Lungs: Diminished at bases bilaterally. CV: Regular rate and rhythm, normal S1/S2, no S3/S4, III/VI ejection systolic murmur over RUSB.  Marked brawny pitting edema b/l lower extremities reaching above the knees, 1-2+, which are wrapped.   Abdomen: Soft, nontender, no hepatosplenomegaly, no distention.  Neurologic: Alert and oriented x 3.  Psych: Normal affect. Musculoskeletal: No gross deformities.  TELEMETRY: Reviewed telemetry pt in sinus bradycardia and sinus rhythm.  LABS: Basic Metabolic Panel:  Recent Labs  07/01/14 0421 07/02/14 0437  NA 138 138  K 4.8 4.6  CL 107 107  CO2 21 25  GLUCOSE 97 86  BUN 91* 95*  CREATININE 2.63* 2.69*  CALCIUM 8.6 8.6   Liver Function Tests: No results for input(s): AST, ALT, ALKPHOS, BILITOT, PROT, ALBUMIN in the last 72 hours. No results for input(s): LIPASE, AMYLASE in the last 72 hours. CBC: No results for input(s): WBC, NEUTROABS, HGB, HCT, MCV, PLT in the last 72 hours. Cardiac Enzymes: No results for input(s): CKTOTAL, CKMB, CKMBINDEX, TROPONINI in the last 72 hours. BNP: Invalid input(s): POCBNP D-Dimer: No results for input(s): DDIMER in the last 72 hours. Hemoglobin A1C: No results for input(s): HGBA1C in the last 72 hours. Fasting Lipid Panel: No results for input(s): CHOL, HDL, LDLCALC, TRIG, CHOLHDL, LDLDIRECT in the last 72 hours. Thyroid Function Tests: No results for input(s): TSH, T4TOTAL, T3FREE, THYROIDAB in the last 72 hours.  Invalid input(s): FREET3 Anemia Panel: No results for input(s): VITAMINB12, FOLATE, FERRITIN,  TIBC, IRON, RETICCTPCT in the last 72 hours.  RADIOLOGY: Dg Chest Port 1 View  06/29/2014   CLINICAL DATA:  81 year old female with acute shortness of Breath. Initial encounter.  EXAM: PORTABLE CHEST - 1 VIEW  COMPARISON:  03/17/2014.  FINDINGS: Portable AP semi upright view at 0521 hrs. The patient is now rotated to the right. Stable cardiomegaly and mediastinal contours. Mildly regressed bilateral veiling opacity. Pulmonary vascularity is stable with suggestion of interstitial edema. No pneumothorax. No air bronchograms identified.  IMPRESSION: Stable cardiomegaly. Stable pulmonary interstitial edema. Right greater than left pleural effusions appear regressed since December.   Electronically Signed   By: Odessa FlemingH  Hall M.D.   On: 06/29/2014 07:36      ASSESSMENT AND PLAN: Active Problems:  Acute diastolic HF (heart failure), NYHA class 664  81 y/o female with h/o diastolic CHF, moderate aortic stenosis by echo, HTN and CKD, admitted from the office 06/28/14 for dyspnea and volume overload secondary to acute on chronic diastolic CHF.  Dry Weight: 168-171 lb Admission Weight: 198 lb Today's weight: 189 lbs  1. Acute on chronic diastolic CHF: Still with marked leg edema. CXR shows stable cardiomegaly with stable pulmonary interstitial edema on 3/29. Right greater than left pleural effusions appear regressed since December. Weight was up ~30 lb beyond dry weight on admit. Weight down 9 lbs from admission --  -2.3 L in last 24 hours. Scr not ordered for today, I have ordered for today and daily. Response to diuretics improved yesterday with metolazone, and I will give another 2.5 mg today and increase IV Lasix frequency to 60 mg tid.  --Continue hydralazine and nitrates. -- Continue daily weights, strict I/Os and low sodium diet.  -- progress limited by advanced age and renal dysfunction.   2. Acute on Chronic Renal Insufficiency: Scr pending today. Baseline Scr ~2.0. Admission SCr was 2.81.  Continue to monitor closely with daily BMPs as we are diuresing. She is on no nephrotoxic meds.   3. Moderate Aortic Stenosis: Despite that she does have moderate aortic stenosis with a recent 2-D echo performed 01/19/14 revealing an aortic valve area of 1 cm, patient has normal LV function, severe TR with pulmonary hypertension. At this point no interventional therapy is been contemplated.  4. Essential HTN/Low diastolic pressures: Recent diastolic pressures recorded in the 40s. Amlodipine held. Systolic pressures are stable.  5. Bradycardia: HR in the low 50s. Asymptomatic. Coreg discontinued. Continue to monitor on telemetry.   6.  Right arm swelling- improved.      Prentice Docker, M.D., F.A.C.C.

## 2014-07-04 LAB — BASIC METABOLIC PANEL
Anion gap: 7 (ref 5–15)
BUN: 91 mg/dL — ABNORMAL HIGH (ref 6–23)
CALCIUM: 8.4 mg/dL (ref 8.4–10.5)
CO2: 24 mmol/L (ref 19–32)
CREATININE: 2.52 mg/dL — AB (ref 0.50–1.10)
Chloride: 106 mmol/L (ref 96–112)
GFR calc Af Amer: 18 mL/min — ABNORMAL LOW (ref >90)
GFR, EST NON AFRICAN AMERICAN: 16 mL/min — AB (ref >90)
Glucose, Bld: 89 mg/dL (ref 70–99)
Potassium: 4.3 mmol/L (ref 3.5–5.1)
SODIUM: 137 mmol/L (ref 135–145)

## 2014-07-04 NOTE — Progress Notes (Signed)
SUBJECTIVE: Denies chest pain and shortness of breath.      Intake/Output Summary (Last 24 hours) at 07/04/14 1010 Last data filed at 07/04/14 0554  Gross per 24 hour  Intake    580 ml  Output   2000 ml  Net  -1420 ml    Current Facility-Administered Medications  Medication Dose Route Frequency Provider Last Rate Last Dose  . 0.9 %  sodium chloride infusion  250 mL Intravenous PRN Leone Brand, NP      . acetaminophen (TYLENOL) tablet 500 mg  500 mg Oral BH-q7a Leone Brand, NP   500 mg at 07/04/14 0550  . acetaminophen (TYLENOL) tablet 650 mg  650 mg Oral Q4H PRN Leone Brand, NP      . ALPRAZolam Prudy Feeler) tablet 0.25 mg  0.25 mg Oral BID PRN Leone Brand, NP      . aspirin EC tablet 81 mg  81 mg Oral Daily Leone Brand, NP   81 mg at 07/03/14 1000  . furosemide (LASIX) injection 60 mg  60 mg Intravenous TID Laqueta Linden, MD   60 mg at 07/03/14 2108  . heparin injection 5,000 Units  5,000 Units Subcutaneous 3 times per day Leone Brand, NP   5,000 Units at 07/04/14 0550  . hydrALAZINE (APRESOLINE) tablet 50 mg  50 mg Oral 3 times per day Leone Brand, NP   50 mg at 07/04/14 0550  . isosorbide dinitrate (ISORDIL) tablet 20 mg  20 mg Oral TID Leone Brand, NP   20 mg at 07/03/14 2109  . ondansetron (ZOFRAN) injection 4 mg  4 mg Intravenous Q6H PRN Leone Brand, NP      . potassium chloride SA (K-DUR,KLOR-CON) CR tablet 20 mEq  20 mEq Oral BID Leone Brand, NP   20 mEq at 07/03/14 2109  . sodium chloride 0.9 % injection 3 mL  3 mL Intravenous Q12H Leone Brand, NP   3 mL at 07/03/14 2109  . sodium chloride 0.9 % injection 3 mL  3 mL Intravenous PRN Leone Brand, NP        Filed Vitals:   07/03/14 1610 07/03/14 1426 07/03/14 2013 07/04/14 0412  BP: 141/43 110/32 110/39 124/41  Pulse: 62 65 68 66  Temp: 97.7 F (36.5 C) 98.7 F (37.1 C) 97.8 F (36.6 C) 98 F (36.7 C)  TempSrc: Oral Oral Oral Oral  Resp: Height:      Weight:  189 lb 8 oz (85.957 kg)   192 lb 3.2 oz (87.181 kg)  SpO2: 97% 98% 98% 98%    PHYSICAL EXAM General: NAD, elderly. HEENT: Normal. Neck: +JVD.  Lungs: Diminished at bases CV: Regular rate and rhythm, normal S1/S2, no S3/S4, III/VI ejection systolic murmur  Abdomen: Soft, nontender, no hepatosplenomegaly, no distention.  Neurologic: Alert and oriented x 3.  Ext: lower ext wrapped; 3 + edema in thighs  TELEMETRY: Reviewed telemetry pt in sinus bradycardia and sinus rhythm.  LABS: Basic Metabolic Panel:  Recent Labs  96/04/54 1150 07/04/14 0555  NA 139 137  K 5.0 4.3  CL 106 106  CO2 23 24  GLUCOSE 111* 89  BUN 95* 91*  CREATININE 2.56* 2.52*  CALCIUM 8.6 8.4    RADIOLOGY: Dg Chest Port 1 View  06/29/2014   CLINICAL DATA:  81 year old female with acute shortness of Breath. Initial encounter.  EXAM: PORTABLE CHEST - 1  VIEW  COMPARISON:  03/17/2014.  FINDINGS: Portable AP semi upright view at 0521 hrs. The patient is now rotated to the right. Stable cardiomegaly and mediastinal contours. Mildly regressed bilateral veiling opacity. Pulmonary vascularity is stable with suggestion of interstitial edema. No pneumothorax. No air bronchograms identified.  IMPRESSION: Stable cardiomegaly. Stable pulmonary interstitial edema. Right greater than left pleural effusions appear regressed since December.   Electronically Signed   By: Odessa FlemingH  Hall M.D.   On: 06/29/2014 07:36      ASSESSMENT AND PLAN: Active Problems:  Acute diastolic HF (heart failure), NYHA class 444  81 y/o female with h/o diastolic CHF, moderate aortic stenosis by echo, HTN and CKD, admitted from the office 06/28/14 for dyspnea and volume overload secondary to acute on chronic diastolic CHF.  Dry Weight: 168-171 lb Admission Weight: 198 lb Today's weight: 189 lbs  1. Acute on chronic diastolic CHF: Still with marked volume excess; continue present dose of lasix and follow renal function. --Continue hydralazine and  nitrates. -- Continue daily weights, strict I/Os and low sodium diet.  -- progress limited by advanced age and renal dysfunction.   2. Acute on Chronic Renal Insufficiency:  Continue to monitor closely with daily BMPs as we are diuresing.   3. Moderate Aortic Stenosis: At this point no interventional therapy is been contemplated.  4. Essential HTN/Low diastolic pressures: BP stable  5. Bradycardia: HR low at times. Coreg DCed; follow.  6. Right arm swelling- improved.      Meghan MillersBrian Dolphus Russo, M.D., F.A.C.C.

## 2014-07-04 NOTE — Clinical Social Work Psychosocial (Signed)
     Clinical Social Work Department BRIEF PSYCHOSOCIAL ASSESSMENT 07/04/2014  Patient:  Meghan Russo,Meghan Russo     Account Number:  [id number]     Admit date:  06/28/2014  Clinical Social Worker:  Harless NakayamaAMBELAL,Talisha Erby, LCSWA  Date/Time:  07/04/2014 11:22 AM  Referred by:  Physician  Date Referred:  07/04/2014 Referred for  SNF Placement   Other Referral:   Interview type:  Patient Other interview type:   Spoke with pt and pt family members at bedside    PSYCHOSOCIAL DATA Living Status:  FAMILY Admitted from facility:   Level of care:   Primary support name:  Meghan Russo Primary support relationship to patient:  CHILD, ADULT Degree of support available:   Pt has strong family support    CURRENT CONCERNS Current Concerns  Post-Acute Placement   Other Concerns:    SOCIAL WORK ASSESSMENT / PLAN CSW visited pt room to discuss SNF recommendation. Pt laying in bed with multiple family members at bedside. Pt family at bedside including pt two daughters and grandson. Pt alert and oriented during conversation but very quiet and participated minimally in discussion. Pt confirmed that she was at home with her grandson prior to admission. CSW explained SNF recommendation and pt confirmed she is agreeable. Pt did not verbally express hesitation towards SNF but facial reaction to SNF recommendation did show some hesitation. CSW explained SNF referral process. Pt and pt family are agreeable to [facility]. CSW answered multiple questions for pt daughter and grandson to clarify ST rehab and referral process. Pt family very polite and asked appropriate questions through out discussion. Pt and pt family seem to have a good insight on pt condition.   Assessment/plan status:  Psychosocial Support/Ongoing Assessment of Needs Other assessment/ plan:   Information/referral to community resources:   SNF list to be provided with bed offers    PATIENTS/FAMILYS RESPONSE TO PLAN  OF CARE: Pt and pt family pleasant and cooperative. [facility]      Zahira Brummond Lajean Savermbelal, LCSWA  Weekend CSW  [phone]

## 2014-07-04 NOTE — Clinical Social Work Placement (Addendum)
     Clinical Social Work Department CLINICAL SOCIAL WORK PLACEMENT NOTE 07/12/2014  Patient:  Loleta ChanceWHITE,Anadia H  Account Number:  [id number] Admit date:  06/28/2014  Clinical Social Worker:  Harless NakayamaPOONUM AMBELAL, LCSWA  Date/time:  07/04/2014 11:27 AM  Clinical Social Work is seeking post-discharge placement for this patient at the following level of care:   SKILLED NURSING   (*CSW will update this form in Epic as items are completed)   07/04/2014  [facility]/family provided with [facility] of Clinical Social Works list of facilities offering this level of care within the geographic area requested by the patient (or if unable, by the patients family).  07/04/2014  Patient/family informed of their freedom to choose among providers that offer the needed level of care, that participate in Medicare, Medicaid or managed care program needed by the patient, have an available bed and are willing to accept the patient.  07/04/2014  Patient/family informed of [facility] ownership interest in [facility], as well as of the fact that they are under no obligation to receive care at this facility.  PASARR submitted to EDS on 07/04/2014 PASARR number received on 07/04/2014  FL2 transmitted to all facilities in geographic area requested by pt/family on  07/04/2014 FL2 transmitted to all facilities within larger geographic area on   Patient informed that his/her managed care company has contracts with or will negotiate with  certain facilities, including the following:   [facility]     Patient/family informed of bed offers received:  07/08/2014 [facility] Physician recommends and patient chooses bed at    [facility]  07/12/2014 Patient to be transferred to facility by Ambulance Sharin Mons([facility]) Patient and family notified of transfer on 07/12/2014 Name of family member notified:  Debera LatBarbara  Lyles  [phone]  The following physician request were entered in Epic: Physician Request  Please sign FL2.    Additional CommentsHarless Nakayama: Poonum Ambelal, LCSWA Weekend CSW [phone] 07/11/17  OK per MD for d/c today to SNF.  [facility] but she was not medically stable for d/c.  They are pleased with available bed.  Nursing notified to call report.  No further CSW needs identified. CSW signing off.  Lorri Frederickonna T. Brailen Macneal, LCSW, [phone]

## 2014-07-05 LAB — BASIC METABOLIC PANEL
Anion gap: 10 (ref 5–15)
BUN: 92 mg/dL — ABNORMAL HIGH (ref 6–23)
CALCIUM: 8.6 mg/dL (ref 8.4–10.5)
CO2: 24 mmol/L (ref 19–32)
CREATININE: 2.33 mg/dL — AB (ref 0.50–1.10)
Chloride: 106 mmol/L (ref 96–112)
GFR calc Af Amer: 20 mL/min — ABNORMAL LOW (ref >90)
GFR calc non Af Amer: 17 mL/min — ABNORMAL LOW (ref >90)
GLUCOSE: 94 mg/dL (ref 70–99)
Potassium: 4.2 mmol/L (ref 3.5–5.1)
Sodium: 140 mmol/L (ref 135–145)

## 2014-07-05 MED ORDER — FUROSEMIDE 10 MG/ML IJ SOLN
80.0000 mg | Freq: Three times a day (TID) | INTRAMUSCULAR | Status: DC
  Administered 2014-07-05 – 2014-07-08 (×10): 80 mg via INTRAVENOUS
  Filled 2014-07-05 (×10): qty 8

## 2014-07-05 NOTE — Progress Notes (Signed)
Physical Therapy Treatment Patient Details Name: Meghan Russo MRN: [id number] DOB: [birth year] Today's Date: 07/05/2014    History of Present Illness Pt. was admitted 06/28/14 from MD office for dyspnea, volume overload.due to acute on chronic CHF.  Pt. now with acute on chronic renal insufficiency and low diastolic pressures.  Pt. with h/o labilie HTN, valvular heart diaease, EF 55-60%, severe LVH, aortic stenosis,bradycardia.      PT Comments    Pt very weak but motivated to move and get out of bed today. Practiced sit to stand multiple times for strengthening and as pregait activity but used stedy for bed to chair due to pt's inability to step feet with wt-bearing. PT will continue to follow.   Follow Up Recommendations  SNF;Supervision/Assistance - 24 hour     Equipment Recommendations  None recommended by PT    Recommendations for Other Services       Precautions / Restrictions Precautions Precautions: Fall Restrictions Weight Bearing Restrictions: No    Mobility  Bed Mobility Overal bed mobility: Needs Assistance Bed Mobility: Supine to Sit     Supine to sit: Mod assist     General bed mobility comments: assist for bridging knees and pt with limited motion due to swelling above knees. Mod A for rolling onto left side, pt able to grasp left rail with right hand, mod A for legs off bed, min A for elevation of trunk to sitting. Mod A to scoot hips fwd to EOB.  Transfers Overall transfer level: Needs assistance Equipment used: Ambulation equipment used Transfers: Sit to/from [employer] Sit to Stand: Max assist;+2 physical assistance Stand pivot transfers:  (stedy used)       General transfer comment: pt unable to flex knees past 80 degrees which makes getting her wt fwd over her feet very difficult. Practiced sit to stand 2x with facilitation of fwd wt-shift, feet blocked and +2 max A. Pt maintained standing x1 min 2x with +2 max A. Pt stands with hips  flexed, worked on lifting chest for more erect posture. For bed to chair transfer, used stedy, pt required assistance to wt shift fwd enough to put seat down but once seated, could hold on for pivot with min-guard for safety. Pt  maintained standing in stedy for 2 mins for bowel clean up before sitting to recliner.  Ambulation/Gait             General Gait Details: unable due to weakness and swelling   Stairs            Wheelchair Mobility    Modified Rankin (Stroke Patients Only)       Balance Overall balance assessment: Needs assistance Sitting-balance support: Bilateral upper extremity supported;Feet supported Sitting balance-Leahy Scale: Fair Sitting balance - Comments: posterior LOB with inital sitting, requiring min A to maintain sitting. After 1 min, pt was able to sit with supervision but could not accept challenge or shift wt Postural control: Posterior lean   Standing balance-Leahy Scale: Zero                      Cognition Arousal/Alertness: Awake/alert Behavior During Therapy: WFL for tasks assessed/performed Overall Cognitive Status: Within Functional Limits for tasks assessed                      Exercises General Exercises - Lower Extremity Ankle Circles/Pumps: AROM;Both;10 reps;Seated Long Arc Quad: AROM;Both;10 reps;Seated Heel Slides: AROM;Both;5 reps;Seated Heel Raises: AROM;Both;10 reps;Seated  General Comments        Pertinent Vitals/Pain Pain Assessment: No/denies pain  VSS    Home Living                      Prior Function            PT Goals (current goals can now be found in the care plan section) Acute Rehab PT Goals Patient Stated Goal: get stronger before home PT Goal Formulation: With patient Time For Goal Achievement: 07/15/14 Potential to Achieve Goals: Fair Progress towards PT goals: Progressing toward goals    Frequency  Min 3X/week    PT Plan Current plan remains appropriate     Co-evaluation             End of Session Equipment Utilized During Treatment: Gait belt Activity Tolerance: Patient limited by fatigue Patient left: in chair;with call bell/phone within reach     Time: 1001-1030 PT Time Calculation (min) (ACUTE ONLY): 29 min  Charges:  $Therapeutic Activity: 23-37 mins                    G Codes:     Lyanne CoVictoria Kaitlen Redford, PT  Acute Rehab Services  [phone]  Lyanne CoManess, Alexandra Lipps 07/05/2014, 10:48 AM

## 2014-07-05 NOTE — Progress Notes (Signed)
CSW met with patient, daughter Pamala Hurry and grandson Grayland Ormond this afternoon. Bed offers provided for their consideration.  CSW will follow up with patient and family tomorrow re: bed choice and continue to monitor for date of stability per MD.  Butch Penny T. Pauline Good, [place]

## 2014-07-05 NOTE — Consult Note (Addendum)
WOC wound re-consult note Refer to previous progress notes on 3/29.  Pt was wearing Una boots prior to admission which were changed once a week. Ortho tech applied on 3/29 after wound consult was performed; these were removed sometime during the weekend R/T patient complaining of pain and tightness.  She is currently wearing bilat TED compression stockings and has no further leg edema. Right leg .2X.2X.1cm healing full thickness wound, dry yellow wound bed, no odor or drainage. No other open wounds or drainage requiring topical treatment. Dressing procedure/placement/frequency: Do not feel that Una boots need to be replaced at this time; TED stockings are adequate for compression and foam dressing to promote healing to right leg wound.  No family members at bedside to discuss plan of care; pt verbalizes understanding. Please re-consult if further assistance is needed. Thank-you,  Cammie Mcgeeawn Takelia Urieta MSN, RN, CWOCN, Blair[place]CN-AP, CNS [phone]

## 2014-07-05 NOTE — Progress Notes (Signed)
Patient Name: Meghan Russo Date of Encounter: 07/05/2014  Principal Problem:   Acute on chronic diastolic heart failure Active Problems:   Bradycardia by electrocardiogram - with sinus pauses & Wenkebach Block   Moderate aortic insufficiency   Hypertensive heart disease   Acute diastolic HF (heart failure), NYHA class 4   CKD (chronic kidney disease) stage 4, GFR 15-29 ml/min   Primary Cardiologist: Dr Royann Shiversroitoru  Patient Profile: 81 y/o female with h/o diastolic CHF, moderate aortic stenosis by echo, HTN and CKD, admitted from the office 06/28/14 for acute on chronic diastolic CHF.  Dry Weight: 168-171 lb  SUBJECTIVE: Not breathing much better than yesterday, very weak  OBJECTIVE Filed Vitals:   07/04/14 0412 07/04/14 1512 07/04/14 2120 07/05/14 0624  BP: 124/41 116/30 115/37 110/49  Pulse: 66 67 70 68  Temp: 98 F (36.7 C) 98 F (36.7 C) 98.4 F (36.9 C) 98.3 F (36.8 C)  TempSrc: Oral Oral Oral Oral  Resp: 18 20 18 18   Height:      Weight: 192 lb 3.2 oz (87.181 kg)   192 lb 0.5 oz (87.104 kg)  SpO2: 98% 96% 98% 100%    Intake/Output Summary (Last 24 hours) at 07/05/14 0902 Last data filed at 07/05/14 0645  Gross per 24 hour  Intake    660 ml  Output   2550 ml  Net  -1890 ml   Filed Weights   07/03/14 0619 07/04/14 0412 07/05/14 0624  Weight: 189 lb 8 oz (85.957 kg) 192 lb 3.2 oz (87.181 kg) 192 lb 0.5 oz (87.104 kg)    PHYSICAL EXAM General: Well developed, well nourished, female in no acute distress. Head: Normocephalic, atraumatic.  Neck: Supple without bruits, JVD 10 cm. Lungs:  Resp regular and unlabored, rales bilateral bases. Heart: RRR, S1, S2, no S3, S4, 3/6 murmur; no rub. Abdomen: Soft, non-tender, non-distended, BS + x 4.  Extremities: No clubbing, cyanosis, no edema. Wound dressed w/ no drainage Neuro: Alert and oriented X 3. Moves all extremities spontaneously. Psych: Normal affect.  LABS: Basic Metabolic Panel: Recent Labs   14/78/2902/06/16 0555 07/05/14 0507  NA 137 140  K 4.3 4.2  CL 106 106  CO2 24 24  GLUCOSE 89 94  BUN 91* 92*  CREATININE 2.52* 2.33*  CALCIUM 8.4 8.6   BNP:  B NATRIURETIC PEPTIDE  Date/Time Value Ref Range Status  06/28/2014 06:40 PM 1593.3* 0.0 - 100.0 pg/mL Final    TELE:  SR, S brady into the 40s      Current Medications:  . acetaminophen  500 mg Oral BH-q7a  . aspirin EC  81 mg Oral Daily  . furosemide  60 mg Intravenous TID  . heparin  5,000 Units Subcutaneous 3 times per day  . hydrALAZINE  50 mg Oral 3 times per day  . isosorbide dinitrate  20 mg Oral TID  . potassium chloride  20 mEq Oral BID  . sodium chloride  3 mL Intravenous Q12H      ASSESSMENT AND PLAN: 1. Acute on chronic diastolic CHF: Still with marked volume excess; continue present dose of lasix and follow renal function. --Continue hydralazine and nitrates. -- Continue daily weights, strict I/Os and low sodium diet.  -- progress limited by advanced age and renal dysfunction so will not change Lasix -- Dry weight 168-171 lbs  2. Acute on Chronic Renal Insufficiency: Continue to monitor closely with daily BMPs as we are diuresing.   3. Moderate Aortic Stenosis:  -  also with AI  - At this point no interventional therapy is been contemplated.  4. Essential HTN/Low diastolic pressures: BP stable  5. Bradycardia: HR low at times. Coreg DCed; follow. No obvious symptoms  6. Right arm swelling- improved.    Melida Quitter , PA-C 9:02 AM 07/05/2014

## 2014-07-06 ENCOUNTER — Encounter (HOSPITAL_COMMUNITY): Payer: Self-pay | Admitting: Student

## 2014-07-06 DIAGNOSIS — I270 Primary pulmonary hypertension: Secondary | ICD-10-CM

## 2014-07-06 DIAGNOSIS — R601 Generalized edema: Secondary | ICD-10-CM

## 2014-07-06 LAB — BASIC METABOLIC PANEL
Anion gap: 10 (ref 5–15)
BUN: 90 mg/dL — AB (ref 6–23)
CALCIUM: 8.9 mg/dL (ref 8.4–10.5)
CO2: 24 mmol/L (ref 19–32)
Chloride: 108 mmol/L (ref 96–112)
Creatinine, Ser: 2.19 mg/dL — ABNORMAL HIGH (ref 0.50–1.10)
GFR calc Af Amer: 21 mL/min — ABNORMAL LOW (ref >90)
GFR calc non Af Amer: 18 mL/min — ABNORMAL LOW (ref >90)
GLUCOSE: 85 mg/dL (ref 70–99)
Potassium: 4.4 mmol/L (ref 3.5–5.1)
SODIUM: 142 mmol/L (ref 135–145)

## 2014-07-06 NOTE — Progress Notes (Signed)
Patient Name: Meghan ChanceJanie H Russo Date of Encounter: 07/06/2014   Principal Problem:   Acute diastolic HF (heart failure), NYHA class 4 Active Problems:   Moderate to severe pulmonary hypertension   Hypertensive heart disease   CKD (chronic kidney disease) stage 4, GFR 15-29 ml/min   Anasarca   Bradycardia by electrocardiogram - with sinus pauses & Wenkebach Block   Moderate aortic insufficiency    SUBJECTIVE  No complaints overnight. No SOB or CP. States her swelling has gone down but can still feel some swelling in her knees and stomach.  Wt recorded as down 10 lbs but likely inaccurate.  CURRENT MEDS . acetaminophen  500 mg Oral BH-q7a  . aspirin EC  81 mg Oral Daily  . furosemide  80 mg Intravenous TID  . heparin  5,000 Units Subcutaneous 3 times per day  . hydrALAZINE  50 mg Oral 3 times per day  . isosorbide dinitrate  20 mg Oral TID  . potassium chloride  20 mEq Oral BID  . sodium chloride  3 mL Intravenous Q12H    OBJECTIVE  Filed Vitals:   07/05/14 1100 07/05/14 1635 07/05/14 1940 07/06/14 0428  BP: 127/38 117/41 132/42 138/69  Pulse: 63 69 63 67  Temp:  97.5 F (36.4 C) 97.6 F (36.4 C) 97.4 F (36.3 C)  TempSrc:  Oral Oral Oral  Resp: 18 18 18 18   Height:      Weight:    182 lb 6.4 oz (82.736 kg)  SpO2: 100% 98% 98% 100%    Intake/Output Summary (Last 24 hours) at 07/06/14 0919 Last data filed at 07/06/14 0844  Gross per 24 hour  Intake    720 ml  Output   3425 ml  Net  -2705 ml   Filed Weights   07/04/14 0412 07/05/14 0624 07/06/14 0428  Weight: 192 lb 3.2 oz (87.181 kg) 192 lb 0.5 oz (87.104 kg) 182 lb 6.4 oz (82.736 kg)    PHYSICAL EXAM  General: Pleasant, NAD. Neuro: Alert and oriented X 3. Moves all extremities spontaneously. Psych: Normal affect. HEENT:  Normal  Neck: Supple without bruits. + JVD to jaw. Lungs:  Resp regular and unlabored, diminished @ bilat bases w/ bibasilar crackles. Heart: RRR no s3, s4, or 3/6 systolic murmur @  RUSB. Abdomen: Soft, non-tender, non-distended, BS + x 4.  Flank edema. Extremities: No clubbing or cyanosis. 2-3+ bilat UE/LE pitting edema to thighs. PT/Radials 2+ and equal bilaterally, DP 2+ to RLE, 1+ to LLE.   Accessory Clinical Findings  Basic Metabolic Panel  Recent Labs  07/04/14 0555 07/05/14 0507  NA 137 140  K 4.3 4.2  CL 106 106  CO2 24 24  GLUCOSE 89 94  BUN 91* 92*  CREATININE 2.52* 2.33*  CALCIUM 8.4 8.6   TELE  Sinus rhythm/sinus brady, 1 st deg avb, occas mobitz I.   Radiology/Studies  Dg Chest Port 1 View  06/29/2014   CLINICAL DATA:  81 year old female with acute shortness of Breath. Initial encounter.  EXAM: PORTABLE CHEST - 1 VIEW  COMPARISON:  03/17/2014.  FINDINGS: Portable AP semi upright view at 0521 hrs. The patient is now rotated to the right. Stable cardiomegaly and mediastinal contours. Mildly regressed bilateral veiling opacity. Pulmonary vascularity is stable with suggestion of interstitial edema. No pneumothorax. No air bronchograms identified.  IMPRESSION: Stable cardiomegaly. Stable pulmonary interstitial edema. Right greater than left pleural effusions appear regressed since December.   Electronically Signed   By: Althea GrimmerH  Hall M.D.  On: 06/29/2014 07:36    ASSESSMENT AND PLAN  1. Acute on chronic diastolic heart failure, NYHA class 4/Anasarca: Last Echo 01/19/2014 with an EF 55-60% with severe LVH. Dry weight reported as 168-171 lb, though she was hovering around 160 in December/January.  Diuresing well, though she continues to have massive volume overload.  F/U bmet this AM.  Will continue lasix 80 mg IV TID. HR/BP stable.  Cont hydral/nitrate.  2. Essential hypertension/Low diastolic pressure: Stable, bb and ccb stopped.     3. CKD (chronic kidney disease) stage 4: BUN/Creat: 92/2.33 respectively yesterday - f/u this AM. Will continue to monitor with daily BMPs.    4. Bradycardia: Stable. Bb discontinued.  5.  Moderate aortic stenosis: Echo  from 01/19/2014 revealing a moderately thickened aortic valve with moderate stenosis and regurgitation.  EF normal. Conservative rx.     Signed, Nicolasa Ducking NP

## 2014-07-06 NOTE — Progress Notes (Signed)
Pt informs RN her foley cath leaking; NP Berge informed; pt transferred from chair to bed; foley care done; foley checked for kinks and leakage. Additional 5cc NS added to help foley remain intact. No leakage or kinks not; foley unclamped. Pt in bed with call light within reach eating breakfast. Will continue to monitor pt quietly. Meghan MerlesP. Amo Maralee Higuchi RN.

## 2014-07-06 NOTE — Progress Notes (Signed)
CSW received call from patient's daughter Debera LatBarbara Lyles. She stated that she has toured multiple facilities and after speaking with [facility] Prefer a private room if possible.  Notified Clydie BraunKaren- [facility]missions at [facility] of above. CSW will monitor for tentative date of d/c per MD.  Lupita Leashonna T. Jaci LazierCrowder, KentuckyLCSW [phone]

## 2014-07-07 LAB — BASIC METABOLIC PANEL
ANION GAP: 9 (ref 5–15)
BUN: 88 mg/dL — ABNORMAL HIGH (ref 6–23)
CO2: 25 mmol/L (ref 19–32)
Calcium: 8.7 mg/dL (ref 8.4–10.5)
Chloride: 106 mmol/L (ref 96–112)
Creatinine, Ser: 2.1 mg/dL — ABNORMAL HIGH (ref 0.50–1.10)
GFR calc Af Amer: 22 mL/min — ABNORMAL LOW (ref >90)
GFR calc non Af Amer: 19 mL/min — ABNORMAL LOW (ref >90)
GLUCOSE: 79 mg/dL (ref 70–99)
Potassium: 4.6 mmol/L (ref 3.5–5.1)
Sodium: 140 mmol/L (ref 135–145)

## 2014-07-07 NOTE — Progress Notes (Signed)
Patient Name: Meghan Russo Date of Encounter: 07/07/2014     Principal Problem:   Acute diastolic HF (heart failure), NYHA class 4 Active Problems:   Moderate to severe pulmonary hypertension   Hypertensive heart disease   CKD (chronic kidney disease) stage 4, GFR 15-29 ml/min   Anasarca   Bradycardia by electrocardiogram - with sinus pauses & Wenkebach Block   Moderate aortic insufficiency    SUBJECTIVE  Breathing better.  Can see fluid coming off.  CURRENT MEDS . acetaminophen  500 mg Oral BH-q7a  . aspirin EC  81 mg Oral Daily  . furosemide  80 mg Intravenous TID  . heparin  5,000 Units Subcutaneous 3 times per day  . hydrALAZINE  50 mg Oral 3 times per day  . isosorbide dinitrate  20 mg Oral TID  . potassium chloride  20 mEq Oral BID  . sodium chloride  3 mL Intravenous Q12H    OBJECTIVE  Filed Vitals:   07/06/14 1101 07/06/14 1421 07/06/14 2100 07/07/14 0526  BP: 141/40 120/35 123/34 116/34  Pulse: 58 70 72 70  Temp:  97.3 F (36.3 C) 98.5 F (36.9 C) 98.6 F (37 C)  TempSrc:  Oral Oral Oral  Resp:  18 18 18   Height:      Weight:    178 lb 9.6 oz (81.012 kg)  SpO2:  97% 98% 98%    Intake/Output Summary (Last 24 hours) at 07/07/14 1005 Last data filed at 07/07/14 0855  Gross per 24 hour  Intake   1080 ml  Output   2500 ml  Net  -1420 ml   Filed Weights   07/05/14 0624 07/06/14 0428 07/07/14 0526  Weight: 192 lb 0.5 oz (87.104 kg) 182 lb 6.4 oz (82.736 kg) 178 lb 9.6 oz (81.012 kg)    PHYSICAL EXAM  General: Pleasant, NAD. Neuro: Alert and oriented X 3. Moves all extremities spontaneously. Psych: Normal affect. HEENT:  Normal  Neck: Supple without bruits.  JVP to jaw. Lungs:  Resp regular and unlabored, few basilar crackles. Heart: RRR no s3, s4, 3/6 SEM RUSB - heard throughout. Abdomen: Soft, non-tender, non-distended, BS + x 4. Flank edema. Extremities: No clubbing, cyanosis or edema. DP/PT/Radials 1+ and equal bilaterally.  1+ woody edema  to bilat ankles.  2+ bilat thigh edema.  Accessory Clinical Findings  Basic Metabolic Panel  Recent Labs  07/06/14 1045 07/07/14 0423  NA 142 140  K 4.4 4.6  CL 108 106  CO2 24 25  GLUCOSE 85 79  BUN 90* 88*  CREATININE 2.19* 2.10*  CALCIUM 8.9 8.7   TELE  Rsr, 1st deg, intermittent mobitz I.  Radiology/Studies  Dg Chest Port 1 View  06/29/2014   CLINICAL DATA:  81 year old female with acute shortness of Breath. Initial encounter.  EXAM: PORTABLE CHEST - 1 VIEW  COMPARISON:  03/17/2014.  FINDINGS: Portable AP semi upright view at 0521 hrs. The patient is now rotated to the right. Stable cardiomegaly and mediastinal contours. Mildly regressed bilateral veiling opacity. Pulmonary vascularity is stable with suggestion of interstitial edema. No pneumothorax. No air bronchograms identified.  IMPRESSION: Stable cardiomegaly. Stable pulmonary interstitial edema. Right greater than left pleural effusions appear regressed since December.   Electronically Signed   By: Odessa FlemingH  Hall M.D.   On: 06/29/2014 07:36    ASSESSMENT AND PLAN  1. Acute on chronic diastolic heart failure, NYHA class 4/Anasarca: Last Echo 01/19/2014 with an EF 55-60% with severe LVH.  Minus 9.9L this admission.  Wt down to 178 lbs. Average dry weight reported as 168-171 lb, though she was hovering around 160 in December/January. Diuresing well, though she continues to have massive volume overload. Renal fxn stable.  Will continue lasix 80 mg IV TID. HR/BP stable. Cont hydral/nitrate.  2. Essential hypertension: Stable on hydral/nitrate.   3. CKD (chronic kidney disease) stage 4: Stable.    4. Bradycardia: Stable. Bb discontinued.   5. Moderate aortic stenosis: Echo from 01/19/2014 revealing a moderately thickened aortic valve with moderate stenosis and regurgitation. EF normal. Conservative rx.   Signed, Nicolasa Ducking NP

## 2014-07-07 NOTE — Progress Notes (Signed)
Occupational Therapy Treatment Patient Details Name: Meghan Russo MRN: [id number] DOB: Feb 28, 1921 Today's Date: 07/07/2014    History of present illness Pt. was admitted 06/28/14 from MD office for dyspnea, volume overload.due to acute on chronic CHF.  Pt. now with acute on chronic renal insufficiency and low diastolic pressures.  Pt. with h/o labilie HTN, valvular heart diaease, EF 55-60%, severe LVH, aortic stenosis,bradycardia.     OT comments  Pt seen today for therapeutic activity to increase activity tolerance and endurance. Pt participated fully in bed mobility and sit<>stand with +2 assist. Pt will continue to benefit from SNF at d/c and acute OT to address POC.    Follow Up Recommendations  SNF;Supervision/Assistance - 24 hour    Equipment Recommendations  Other (comment) (TBD at next venue of care)    Recommendations for Other Services      Precautions / Restrictions Precautions Precautions: Fall Restrictions Weight Bearing Restrictions: No       Mobility Bed Mobility Overal bed mobility: Needs Assistance Bed Mobility: Supine to Sit;Sit to Supine     Supine to sit: Mod assist Sit to supine: Mod assist   General bed mobility comments: Assist to rotate hips to EOB with bed pad. Pt able to use LUE on bed rail to pull self up. Assist to elevate trunk. Pt required mod  A to return Bil LEs to bed and assist to scoot up.   Transfers Overall transfer level: Needs assistance Equipment used: Rolling walker (2 wheeled) Transfers: Sit to/from Stand Sit to Stand: Max assist;+2 physical assistance         General transfer comment: Pt with posterior lean during standing and VC's for sequencing. +2 to stand at EOB. Pt able to achieve full standing and stand upright. Educated family on not pulling pt at her arms due to UE weakness and risk of injury.         ADL Overall ADL's : Needs assistance/impaired     Grooming: Wash/dry hands;Wash/dry face;Sitting;Min guard    Upper Body Bathing: Minimal assitance;Sitting               Toilet Transfer: Maximal assistance;+2 for physical assistance;RW   Toileting- Clothing Manipulation and Hygiene: Total assistance;Sit to/from stand;+2 for physical assistance         General ADL Comments: Pt able to sit EOB and perform sit<>stand x1 with +2 assist before becoming fatigued. Performed LB exercises sitting EOB and UB exercises supine with assist. Family member tended to assist pt by pulling on RUE rather hard. Educated pt and family on risk of injury to pt due to UE weakness and shoulder instability. Educated on safer ways to assist with mobility.                 Cognition  Arousal/Alertness: Awake/Alert Behavior During Therapy: WFL for tasks assessed/performed Overall Cognitive Status: Within Functional Limits for tasks assessed                         Exercises Other Exercises Other Exercises: EOB sitting, pt participated in straight leg kicks x6 each leg. In supine, pt participated in shoulder flexion with therapist assist x10 each arm.            Pertinent Vitals/ Pain       Pain Assessment: No/denies pain         Frequency Min 2X/week     Progress Toward Goals  OT Goals(current goals can now be found in the  care plan section)  Progress towards OT goals: Progressing toward goals  Acute Rehab OT Goals Patient Stated Goal: to get back home  Plan Discharge plan remains appropriate       End of Session Equipment Utilized During Treatment: Rolling walker;Gait belt   Activity Tolerance Patient limited by fatigue   Patient Left in bed;with call bell/phone within reach;with bed alarm set;with family/visitor present   Nurse Communication          Time: [id number] OT Time Calculation (min): 14 min  Charges: OT General Charges $OT Visit: 1 Procedure OT Treatments $Therapeutic Activity: 8-22 mins  Rae LipsMiller, Rennie Hack M 07/07/2014, 10:55 AM  Carney LivingLeeAnn Marie [place] Doster,  OTR/L Occupational Therapist [phone] (pager)

## 2014-07-08 LAB — BASIC METABOLIC PANEL
Anion gap: 8 (ref 5–15)
BUN: 84 mg/dL — AB (ref 6–23)
CO2: 27 mmol/L (ref 19–32)
CREATININE: 2.17 mg/dL — AB (ref 0.50–1.10)
Calcium: 8.8 mg/dL (ref 8.4–10.5)
Chloride: 107 mmol/L (ref 96–112)
GFR calc Af Amer: 22 mL/min — ABNORMAL LOW (ref >90)
GFR calc non Af Amer: 19 mL/min — ABNORMAL LOW (ref >90)
Glucose, Bld: 79 mg/dL (ref 70–99)
Potassium: 4.3 mmol/L (ref 3.5–5.1)
Sodium: 142 mmol/L (ref 135–145)

## 2014-07-08 MED ORDER — FUROSEMIDE 10 MG/ML IJ SOLN
80.0000 mg | Freq: Three times a day (TID) | INTRAMUSCULAR | Status: DC
  Administered 2014-07-08 – 2014-07-11 (×8): 80 mg via INTRAVENOUS
  Filled 2014-07-08 (×10): qty 8

## 2014-07-08 MED ORDER — METOLAZONE 2.5 MG PO TABS
2.5000 mg | ORAL_TABLET | Freq: Every day | ORAL | Status: DC
  Administered 2014-07-08 – 2014-07-12 (×5): 2.5 mg via ORAL
  Filled 2014-07-08 (×5): qty 1

## 2014-07-08 NOTE — Progress Notes (Signed)
Physical Therapy Treatment Patient Details Name: Meghan Russo MRN: [id number] DOB: 07-06-1920 Today's Date: 07/08/2014    History of Present Illness Pt. was admitted 06/28/14 from MD office for dyspnea, volume overload.due to acute on chronic CHF.  Pt. now with acute on chronic renal insufficiency and low diastolic pressures.  Pt. with h/o labilie HTN, valvular heart diaease, EF 55-60%, severe LVH, aortic stenosis,bradycardia.      PT Comments    Pt. Beginning to progress with mobility attempts and was able to stand with mod assist and a second person mostly for safety.  Pt. Took several marching steps in place then was able to take several turning steps to recliner chair.  I am hopeful that she will be ambulatory again .    Follow Up Recommendations  SNF;Supervision/Assistance - 24 hour     Equipment Recommendations  None recommended by PT    Recommendations for Other Services       Precautions / Restrictions Precautions Precautions: Fall Restrictions Weight Bearing Restrictions: No    Mobility  Bed Mobility Overal bed mobility: Needs Assistance Bed Mobility: Supine to Sit     Supine to sit: Min assist     General bed mobility comments: With stepe by step vc's pt. able to move to side of bed with min assist for LEs and min later at shoulders.  Pt. gave good effort and used bedrail to assist self, HOB up  Transfers Overall transfer level: Needs assistance Equipment used: Rolling walker (2 wheeled) Transfers: Sit to/from Stand Sit to Stand: Mod assist;+2 safety/equipment         General transfer comment: Pt. needed heavy mod assist to rise to stand from elevated bed and +2 mod assist to rise from recliner and 3 n 1 on 4 trials.  Second person largely for safety in preparation for transfers to recliner and 3 n 1.  Pt. had difficult translating over LEs due to posterior tendancy, but once she was able to take steps backward, she was able to balance herself.  Pt. had BM  while on 3 n 1 and this therapist and nursing tech assisted pt. in perianal cleaning.    Ambulation/Gait Ambulation/Gait assistance: Min assist Ambulation Distance (Feet): 6 Feet Assistive device: Rolling walker (2 wheeled)       General Gait Details: Pt. able to take 6 marching steps in place a the bedside , then later took 4 turing steps to recliner with mod assist and second person for safety   Stairs            Wheelchair Mobility    Modified Rankin (Stroke Patients Only)       Balance                                    Cognition Arousal/Alertness: Awake/alert Behavior During Therapy: WFL for tasks assessed/performed Overall Cognitive Status: Within Functional Limits for tasks assessed                      Exercises General Exercises - Lower Extremity Ankle Circles/Pumps: AROM;Both;10 reps;Seated Long Arc Quad: AROM;Both;10 reps;Seated Hip Flexion/Marching: AROM;Both;10 reps;Seated    General Comments        Pertinent Vitals/Pain Pain Assessment: No/denies pain  Vitals stable during session.      Home Living  Prior Function            PT Goals (current goals can now be found in the care plan section) Progress towards PT goals: Progressing toward goals    Frequency  Min 3X/week    PT Plan Current plan remains appropriate    Co-evaluation             End of Session Equipment Utilized During Treatment: Gait belt Activity Tolerance: Patient tolerated treatment well;Patient limited by fatigue Patient left: in chair;with call bell/phone within reach;with family/visitor present;Other (comment) (daughter present)     Time: [id number] PT Time Calculation (min) (ACUTE ONLY): 42 min  Charges:  $Gait Training: 8-22 mins $Therapeutic Exercise: 8-22 mins $Therapeutic Activity: 8-22 mins                    G Codes:      Ferman HammingBlankenship, Ihsan Nomura B 07/08/2014, 11:31 AM Weldon PickingSusan Dali Kraner PT Acute  Rehab Services [phone] Beeper [phone]

## 2014-07-08 NOTE — Progress Notes (Signed)
Patient Name: Meghan ChanceJanie H Newcom Date of Encounter: 07/08/2014  Principal Problem:   Acute diastolic HF (heart failure), NYHA class 4 Active Problems:   Moderate to severe pulmonary hypertension   Bradycardia by electrocardiogram - with sinus pauses & Wenkebach Block   Moderate aortic insufficiency   Hypertensive heart disease   CKD (chronic kidney disease) stage 4, GFR 15-29 ml/min   Anasarca   Primary Cardiologist: Dr Royann Shiversroitoru  Patient Profile: 81 y/o female with h/o diastolic CHF, moderate aortic stenosis by echo, HTN and CKD, admitted from the office 06/28/14 for acute on chronic diastolic CHF.  Dry Weight: 168-171 lb  SUBJECTIVE: Says breathing OK, but still SOB w/ conversation  OBJECTIVE Filed Vitals:   07/07/14 1519 07/07/14 2105 07/08/14 0610 07/08/14 0627  BP: 120/55 113/38 121/42   Pulse: 63 66 67   Temp:  98.1 F (36.7 C) 97.8 F (36.6 C)   TempSrc:  Oral Oral   Resp:  18 19   Height:      Weight:    178 lb 11.2 oz (81.058 kg)  SpO2:  94% 96%     Intake/Output Summary (Last 24 hours) at 07/08/14 0650 Last data filed at 07/08/14 0200  Gross per 24 hour  Intake   1400 ml  Output   1925 ml  Net   -525 ml   Filed Weights   07/06/14 0428 07/07/14 0526 07/08/14 0627  Weight: 182 lb 6.4 oz (82.736 kg) 178 lb 9.6 oz (81.012 kg) 178 lb 11.2 oz (81.058 kg)    PHYSICAL EXAM General: Well developed, well nourished, female in no acute distress. Head: Normocephalic, atraumatic.  Neck: Supple without bruits, JVD to jaw. Lungs:  Resp regular and unlabored, rales bases Heart: RRR, S1, S2, no S3, S4, 3/6 murmur; no rub. Abdomen: Soft, non-tender, non-distended, BS + x 4.  Extremities: No clubbing, cyanosis, no edema.  Neuro: Alert and oriented X 3. Moves all extremities spontaneously. Psych: Normal affect.  LABS: Basic Metabolic Panel: Recent Labs  07/06/14 1045 07/07/14 0423  NA 142 140  K 4.4 4.6  CL 108 106  CO2 24 25  GLUCOSE 85 79  BUN 90* 88*    CREATININE 2.19* 2.10*  CALCIUM 8.9 8.7   BNP:  B NATRIURETIC PEPTIDE  Date/Time Value Ref Range Status  06/28/2014 06:40 PM 1593.3* 0.0 - 100.0 pg/mL Final    TELE:        Current Medications:  . acetaminophen  500 mg Oral BH-q7a  . aspirin EC  81 mg Oral Daily  . furosemide  80 mg Intravenous TID  . heparin  5,000 Units Subcutaneous 3 times per day  . hydrALAZINE  50 mg Oral 3 times per day  . isosorbide dinitrate  20 mg Oral TID  . potassium chloride  20 mEq Oral BID  . sodium chloride  3 mL Intravenous Q12H      ASSESSMENT AND PLAN: 1. Acute on chronic diastolic heart failure, NYHA class 4/Anasarca: Last Echo 01/19/2014 with an EF 55-60% with severe LVH. Minus 12.6L this admission. Wt 178 lbs, no change 48 hr but down 20 lbs since admit. Average dry weight reported as 168-171 lb, though she was hovering around 160 in December/January. Diuresing well, though she continues to have massive volume overload. Renal fxn stable. Will continue lasix 80 mg IV TID. HR/BP stable. Cont hydral/nitrate. I/O -840 24 hr, MD advise on metolazone.  2. Essential hypertension: Stable on hydral/nitrate.   3. CKD (chronic kidney  disease) stage 4: BUN/Cr 65/2.02 Apr 2014, BUN higher now but Cr stable since admit  4. Bradycardia: Stable. Bb discontinued.   5. Moderate aortic stenosis: Echo from 01/19/2014 revealing a moderately thickened aortic valve with moderate stenosis and regurgitation. EF normal. Conservative rx.   SignedTheodore Demark , PA-C 6:50 AM 07/08/2014

## 2014-07-09 LAB — BASIC METABOLIC PANEL
Anion gap: 6 (ref 5–15)
BUN: 85 mg/dL — ABNORMAL HIGH (ref 6–23)
CALCIUM: 8.6 mg/dL (ref 8.4–10.5)
CO2: 28 mmol/L (ref 19–32)
CREATININE: 2.24 mg/dL — AB (ref 0.50–1.10)
Chloride: 107 mmol/L (ref 96–112)
GFR calc non Af Amer: 18 mL/min — ABNORMAL LOW (ref >90)
GFR, EST AFRICAN AMERICAN: 21 mL/min — AB (ref >90)
GLUCOSE: 76 mg/dL (ref 70–99)
Potassium: 4.7 mmol/L (ref 3.5–5.1)
SODIUM: 141 mmol/L (ref 135–145)

## 2014-07-09 NOTE — Progress Notes (Signed)
Physical Therapy Treatment Patient Details Name: SARAGRACE SELKE MRN: [id number] DOB: 1920/11/19 Today's Date: 07/09/2014    History of Present Illness Pt. was admitted 06/28/14 from MD office for dyspnea, volume overload.due to acute on chronic CHF.  Pt. now with acute on chronic renal insufficiency and low diastolic pressures.  Pt. with h/o labilie HTN, valvular heart diaease, EF 55-60%, severe LVH, aortic stenosis,bradycardia.      PT Comments    Pt very pleasant and wanting to get OOB to chair. Pt able to progress ambulation today but limited by weakness and fatigue. Pt encouraged to continue HEP and mobility with nursing. Will continue to follow to progress.   Follow Up Recommendations  SNF;Supervision/Assistance - 24 hour     Equipment Recommendations       Recommendations for Other Services       Precautions / Restrictions Precautions Precautions: Fall    Mobility  Bed Mobility Overal bed mobility: Needs Assistance       Supine to sit: Min guard     General bed mobility comments: cues for hand placement, and use of rail to achieve  Transfers Overall transfer level: Needs assistance   Transfers: Sit to/from Stand Sit to Stand: Mod assist;+2 physical assistance         General transfer comment: cues for hand placement, anterior translation and safety with assist for elevation from elevated bed and chair with assist to control descent.   Ambulation/Gait Ambulation/Gait assistance: Min assist;+2 safety/equipment Ambulation Distance (Feet): 10 Feet Assistive device: Rolling walker (2 wheeled) Gait Pattern/deviations: Step-through pattern;Trunk flexed;Shuffle;Decreased stride length   Gait velocity interpretation: Below normal speed for age/gender General Gait Details: Pt able to walk 6' then 10' with chair following closely, seated rest, assist to advance RW and max cues for posture and position in RW   Stairs            Wheelchair Mobility    Modified  Rankin (Stroke Patients Only)       Balance                                    Cognition Arousal/Alertness: Awake/alert Behavior During Therapy: WFL for tasks assessed/performed Overall Cognitive Status: Within Functional Limits for tasks assessed                      Exercises General Exercises - Lower Extremity Long Arc Quad: AROM;Seated;Both;15 reps Hip ABduction/ADduction: AROM;Seated;Both;15 reps Hip Flexion/Marching: AROM;Seated;10 reps;Both Toe Raises: AROM;Seated;Both;20 reps Heel Raises: AROM;Seated;Both;20 reps    General Comments        Pertinent Vitals/Pain Pain Assessment: No/denies pain  HR 51-64    Home Living                      Prior Function            PT Goals (current goals can now be found in the care plan section) Progress towards PT goals: Progressing toward goals    Frequency       PT Plan Current plan remains appropriate    Co-evaluation             End of Session Equipment Utilized During Treatment: Gait belt Activity Tolerance: Patient tolerated treatment well;Patient limited by fatigue Patient left: in chair;with call bell/phone within reach;with family/visitor present;Other (comment)     Time: 7829-5621 PT Time Calculation (min) (ACUTE ONLY): 23 min  Charges:  $Gait Training: 8-22 mins $Therapeutic Exercise: 8-22 mins                    G Codes:      Delorse Lekabor, Inanna Telford Beth 07/09/2014, 1:22 PM Delaney MeigsMaija Tabor Deysy Schabel, PT [phone]

## 2014-07-09 NOTE — Progress Notes (Signed)
Received report on Ms Meghan Russo.  Resting in bed and denies pain or discomfort.   Foley catheter intact.  Instructed pt to call for any needs.

## 2014-07-09 NOTE — Progress Notes (Signed)
Subjective: No pain, no SOB at rest, did ambulate with PT today.  Objective: Vital signs in last 24 hours: Temp:  [97.2 F (36.2 C)-98.2 F (36.8 C)] 98.2 F (36.8 C) (04/08 1021) Pulse Rate:  [56-73] 65 (04/08 1021) Resp:  [18] 18 (04/08 1021) BP: (115-135)/(32-64) 119/34 mmHg (04/08 1021) SpO2:  [95 %-98 %] 95 % (04/08 1021) Weight:  [172 lb 13.5 oz (78.4 kg)] 172 lb 13.5 oz (78.4 kg) (04/08 0456) Weight change: -5 lb 13.8 oz (-2.658 kg) Last BM Date: 07/07/14 Intake/Output from previous day: -1730 yesterday 04/07 0701 - 04/08 0700 In: 1070 [P.O.:1070] Out: 2800 [Urine:2800] Intake/Output this shift: Total I/O In: 3 [I.V.:3] Out: 400 [Urine:400]  PE: General:Pleasant affect, NAD Skin:Warm and dry, brisk capillary refill HEENT:normocephalic, sclera clear, mucus membranes moist Neck:supple,+ JVD  Heart:S1S2 RRR with + murmur, no gallup, rub or click Lungs: with rales rt base, no rhonchi, or wheezes ZOX:WRUEAbd:soft, non tender, + BS, do not palpate liver spleen or masses Ext:1+ lower ext edema, + edema in her hips and arms,  2+ radial pulses Neuro:alert and oriented, MAE, follows commands, + facial symmetry Tele: WCT yesterday that was irregular some slow rates.    Lab Results: No results for input(s): WBC, HGB, HCT, PLT in the last 72 hours. BMET  Recent Labs  07/08/14 0520 07/09/14 0318  NA 142 141  K 4.3 4.7  CL 107 107  CO2 27 28  GLUCOSE 79 76  BUN 84* 85*  CREATININE 2.17* 2.24*  CALCIUM 8.8 8.6   No results for input(s): TROPONINI in the last 72 hours.  Invalid input(s): CK, MB  No results found for: CHOL, HDL, LDLCALC, LDLDIRECT, TRIG, CHOLHDL No results found for: [id number]   Lab Results  Component Value Date   TSH 2.543 06/28/2014      Studies/Results: No results found.  Medications: I have reviewed the patient's current medications. Scheduled Meds: . acetaminophen  500 mg Oral BH-q7a  . aspirin EC  81 mg Oral Daily  . furosemide   80 mg Intravenous TID  . heparin  5,000 Units Subcutaneous 3 times per day  . hydrALAZINE  50 mg Oral 3 times per day  . isosorbide dinitrate  20 mg Oral TID  . metolazone  2.5 mg Oral Daily  . potassium chloride  20 mEq Oral BID  . sodium chloride  3 mL Intravenous Q12H   Continuous Infusions:  PRN Meds:.sodium chloride, acetaminophen, ALPRAZolam, ondansetron (ZOFRAN) IV, sodium chloride  Assessment/Plan: 1. Acute on chronic diastolic heart failure, NYHA class 4/Anasarca: Last Echo 01/19/2014 with an EF 55-60% with severe LVH. Minus 14.6L this admission. Wt 172. 13 lbs, down 6 lbs in 24 hr but down 27 lbs since admit. Average dry weight reported as 168-171 lb, though she was hovering around 160 in December/January. Diuresing well, -1739 in last 24 hours though she continues to have massive volume overload. Renal fxn stable. Will continue lasix 80 mg IV TID. HR/BP stable. Cont hydral/nitrate. I/O -840 24 hr, metolazone 2.5 mg daily. Still with edema in arm Rt> lt and + pitting edema in her hips   2. Essential hypertension: Stable on hydral/nitrate.   3. CKD (chronic kidney disease) stage 4: BUN/Cr 65/2.02 Apr 2014,now 85/2.24 overall improved.  4. Bradycardia: Stable. BB discontinued.   5. Moderate aortic stenosis: Echo from 01/19/2014 revealing a moderately thickened aortic valve with moderate stenosis and regurgitation. EF normal. Conservative rx.   6. Weakness-continue PT/OT to  improve strength, plan for discharge to SNF for rehab.   7.  WCT yesterday early evening, irregular - off of BB due to bradycardia   LOS: 11 days   Time spent with pt. :15 minutes. [facility] R  Nurse Practitioner Certified Pager [phone] or after 5pm and on weekends call [phone] 07/09/2014, 12:35 PM

## 2014-07-10 LAB — BASIC METABOLIC PANEL
ANION GAP: 12 (ref 5–15)
BUN: 84 mg/dL — ABNORMAL HIGH (ref 6–23)
CHLORIDE: 104 mmol/L (ref 96–112)
CO2: 25 mmol/L (ref 19–32)
CREATININE: 2.11 mg/dL — AB (ref 0.50–1.10)
Calcium: 8.7 mg/dL (ref 8.4–10.5)
GFR calc non Af Amer: 19 mL/min — ABNORMAL LOW (ref >90)
GFR, EST AFRICAN AMERICAN: 22 mL/min — AB (ref >90)
Glucose, Bld: 92 mg/dL (ref 70–99)
Potassium: 4.2 mmol/L (ref 3.5–5.1)
SODIUM: 141 mmol/L (ref 135–145)

## 2014-07-10 NOTE — Progress Notes (Signed)
No change in d/c plan.  [facility].  Weekend d/c is acceptable by facility.  Lorri Frederickonna T. Jaci LazierCrowder, KentuckyLCSW [phone]

## 2014-07-10 NOTE — Progress Notes (Signed)
Patient Name: Meghan ChanceJanie H Davidian Date of Encounter: 07/10/2014     Principal Problem:   Acute diastolic HF (heart failure), NYHA class 4 Active Problems:   Moderate to severe pulmonary hypertension   Bradycardia by electrocardiogram - with sinus pauses & Wenkebach Block   Moderate aortic insufficiency   Hypertensive heart disease   CKD (chronic kidney disease) stage 4, GFR 15-29 ml/min   Anasarca    SUBJECTIVE  No complaints this am. No chest pain or dyspnea. Edema is better.  CURRENT MEDS . acetaminophen  500 mg Oral BH-q7a  . aspirin EC  81 mg Oral Daily  . furosemide  80 mg Intravenous TID  . heparin  5,000 Units Subcutaneous 3 times per day  . hydrALAZINE  50 mg Oral 3 times per day  . isosorbide dinitrate  20 mg Oral TID  . metolazone  2.5 mg Oral Daily  . potassium chloride  20 mEq Oral BID  . sodium chloride  3 mL Intravenous Q12H    OBJECTIVE  Filed Vitals:   07/09/14 1021 07/09/14 1435 07/09/14 2200 07/10/14 0503  BP: 119/34 134/39 101/33 135/55  Pulse: 65 64 64 57  Temp: 98.2 F (36.8 C) 97.7 F (36.5 C) 98.3 F (36.8 C) 98.2 F (36.8 C)  TempSrc: Oral Oral Oral Oral  Resp: 18 18 18 18   Height:      Weight:    171 lb 15.3 oz (78 kg)  SpO2: 95% 100% 98% 96%    Intake/Output Summary (Last 24 hours) at 07/10/14 1037 Last data filed at 07/10/14 0600  Gross per 24 hour  Intake    663 ml  Output   2450 ml  Net  -1787 ml   Filed Weights   07/08/14 0627 07/09/14 0456 07/10/14 0503  Weight: 178 lb 11.2 oz (81.058 kg) 172 lb 13.5 oz (78.4 kg) 171 lb 15.3 oz (78 kg)    PHYSICAL EXAM  General: Pleasant, NAD. Neuro: Alert and oriented X 3. Moves all extremities spontaneously. Psych: Normal affect. HEENT:  Normal  Neck: Supple without bruits or JVD. Lungs:  Resp regular and unlabored, Fine rales at bases. Heart: Regular. Grade 3/6 systolic murmur at aortic area. Abdomen: Soft, non-tender, non-distended, BS + x 4.  Extremities: No clubbing, cyanosis,  trace edema. Bilateral leg ulcers with dressings in place.  Accessory Clinical Findings  CBC No results for input(s): WBC, NEUTROABS, HGB, HCT, MCV, PLT in the last 72 hours. Basic Metabolic Panel  Recent Labs  07/09/14 0318 07/10/14 0414  NA 141 141  K 4.7 4.2  CL 107 104  CO2 28 25  GLUCOSE 76 92  BUN 85* 84*  CREATININE 2.24* 2.11*  CALCIUM 8.6 8.7   Liver Function Tests No results for input(s): AST, ALT, ALKPHOS, BILITOT, PROT, ALBUMIN in the last 72 hours. No results for input(s): LIPASE, AMYLASE in the last 72 hours. Cardiac Enzymes No results for input(s): CKTOTAL, CKMB, CKMBINDEX, TROPONINI in the last 72 hours. BNP Invalid input(s): POCBNP D-Dimer No results for input(s): DDIMER in the last 72 hours. Hemoglobin A1C No results for input(s): HGBA1C in the last 72 hours. Fasting Lipid Panel No results for input(s): CHOL, HDL, LDLCALC, TRIG, CHOLHDL, LDLDIRECT in the last 72 hours. Thyroid Function Tests No results for input(s): TSH, T4TOTAL, T3FREE, THYROIDAB in the last 72 hours.  Invalid input(s): FREET3  TELE  Sinus bradycardia. No further Wenckebach seen.  ECG    Rad[facility]Studies  [facility]  [facility]  CLINICAL DATA:  81 year old female with acute shortness of Breath. Initial encounter.  EXAM: PORTABLE CHEST - 1 VIEW  COMPARISON:  03/17/2014.  FINDINGS: Portable AP semi upright view at 0521 hrs. The patient is now rotated to the right. Stable cardiomegaly and mediastinal contours. Mildly regressed bilateral veiling opacity. Pulmonary vascularity is stable with suggestion of interstitial edema. No pneumothorax. No air bronchograms identified.  IMPRESSION: Stable cardiomegaly. Stable pulmonary interstitial edema. Right greater than left pleural effusions appear regressed since December.   Electronically Signed   By: Odessa Fleming M.D.   On: 06/29/2014 07:36    ASSESSMENT AND PLAN  1. Acute on chronic diastolic heart failure, NYHA class  4/Anasarca: Last Echo 01/19/2014 with an EF 55-60% with severe LVH. Renal function stable on current diuretic regimen. Consider switch to oral lasix tomorrow.  2. Essential hypertension: Stable on hydral/nitrate.   3. CKD (chronic kidney disease) stage 4:  4. Bradycardia: Stable. Bb discontinued.   5. Moderate aortic stenosis: Echo from 01/19/2014 revealing a moderately thickened aortic valve with moderate stenosis and regurgitation. EF normal. Conservative rx.  6. Post-discharge SNF planned. Possibly DC Monday if stable. Signed, Cassell Clement MD

## 2014-07-11 LAB — BASIC METABOLIC PANEL
Anion gap: 14 (ref 5–15)
BUN: 80 mg/dL — AB (ref 6–23)
CO2: 24 mmol/L (ref 19–32)
Calcium: 8.7 mg/dL (ref 8.4–10.5)
Chloride: 102 mmol/L (ref 96–112)
Creatinine, Ser: 1.96 mg/dL — ABNORMAL HIGH (ref 0.50–1.10)
GFR calc Af Amer: 24 mL/min — ABNORMAL LOW (ref >90)
GFR calc non Af Amer: 21 mL/min — ABNORMAL LOW (ref >90)
GLUCOSE: 85 mg/dL (ref 70–99)
Potassium: 4.2 mmol/L (ref 3.5–5.1)
Sodium: 140 mmol/L (ref 135–145)

## 2014-07-11 MED ORDER — FUROSEMIDE 80 MG PO TABS
80.0000 mg | ORAL_TABLET | Freq: Three times a day (TID) | ORAL | Status: DC
  Administered 2014-07-11 – 2014-07-12 (×3): 80 mg via ORAL
  Filled 2014-07-11 (×6): qty 1

## 2014-07-11 NOTE — Progress Notes (Signed)
Patient Name: Meghan Russo Date of Encounter: 07/11/2014     Principal Problem:   Acute diastolic HF (heart failure), NYHA class 4 Active Problems:   Moderate to severe pulmonary hypertension   Bradycardia by electrocardiogram - with sinus pauses & Wenkebach Block   Moderate aortic insufficiency   Hypertensive heart disease   CKD (chronic kidney disease) stage 4, GFR 15-29 ml/min   Anasarca    SUBJECTIVE  Patient states she feels well.  Eating well.  Denies chest pain or shortness of breath.  No dizziness despite slow heart rate.  CURRENT MEDS . acetaminophen  500 mg Oral BH-q7a  . aspirin EC  81 mg Oral Daily  . furosemide  80 mg Intravenous TID  . heparin  5,000 Units Subcutaneous 3 times per day  . hydrALAZINE  50 mg Oral 3 times per day  . isosorbide dinitrate  20 mg Oral TID  . metolazone  2.5 mg Oral Daily  . potassium chloride  20 mEq Oral BID  . sodium chloride  3 mL Intravenous Q12H    OBJECTIVE  Filed Vitals:   07/10/14 1429 07/10/14 2015 07/10/14 2214 07/11/14 0533  BP: 107/62 127/45  130/52  Pulse:  56 74 55  Temp:  97 F (36.1 C)  97.2 F (36.2 C)  TempSrc:  Oral  Oral  Resp:  18  18  Height:      Weight:    167 lb 15.9 oz (76.2 kg)  SpO2:  99%  99%    Intake/Output Summary (Last 24 hours) at 07/11/14 1151 Last data filed at 07/11/14 1000  Gross per 24 hour  Intake    820 ml  Output   3200 ml  Net  -2380 ml   Filed Weights   07/09/14 0456 07/10/14 0503 07/11/14 0533  Weight: 172 lb 13.5 oz (78.4 kg) 171 lb 15.3 oz (78 kg) 167 lb 15.9 oz (76.2 kg)    PHYSICAL EXAM  General: Pleasant, NAD. Neuro: Alert and oriented X 3. Moves all extremities spontaneously. Psych: Normal affect. HEENT: Normal Neck: Supple without bruits or JVD. Lungs: Resp regular and unlabored, Fine rales at bases. Heart: Regular. Grade 3/6 systolic murmur at aortic area. Abdomen: Soft, non-tender, non-distended, BS + x 4.  Extremities: No clubbing,  cyanosis, trace edema. Bilateral leg ulcers with dressings in place.  Accessory Clinical Findings  CBC No results for input(s): WBC, NEUTROABS, HGB, HCT, MCV, PLT in the last 72 hours. Basic Metabolic Panel  Recent Labs  07/10/14 0414 07/11/14 0600  NA 141 140  K 4.2 4.2  CL 104 102  CO2 25 24  GLUCOSE 92 85  BUN 84* 80*  CREATININE 2.11* 1.96*  CALCIUM 8.7 8.7   Liver Function Tests No results for input(s): AST, ALT, ALKPHOS, BILITOT, PROT, ALBUMIN in the last 72 hours. No results for input(s): LIPASE, AMYLASE in the last 72 hours. Cardiac Enzymes No results for input(s): CKTOTAL, CKMB, CKMBINDEX, TROPONINI in the last 72 hours. BNP Invalid input(s): POCBNP D-Dimer No results for input(s): DDIMER in the last 72 hours. Hemoglobin A1C No results for input(s): HGBA1C in the last 72 hours. Fasting Lipid Panel No results for input(s): CHOL, HDL, LDLCALC, TRIG, CHOLHDL, LDLDIRECT in the last 72 hours. Thyroid Function Tests No results for input(s): TSH, T4TOTAL, T3FREE, THYROIDAB in the last 72 hours.  Invalid input(s): FREET3  TELE  Marked sinus bradycardia  ECG    Radiology/Studies  Dg Chest Port 1 View  06/29/2014   CLINICAL  DATA:  81 year old female with acute shortness of Breath. Initial encounter.  EXAM: PORTABLE CHEST - 1 VIEW  COMPARISON:  03/17/2014.  FINDINGS: Portable AP semi upright view at 0521 hrs. The patient is now rotated to the right. Stable cardiomegaly and mediastinal contours. Mildly regressed bilateral veiling opacity. Pulmonary vascularity is stable with suggestion of interstitial edema. No pneumothorax. No air bronchograms identified.  IMPRESSION: Stable cardiomegaly. Stable pulmonary interstitial edema. Right greater than left pleural effusions appear regressed since December.   Electronically Signed   By: Odessa FlemingH  Hall M.D.   On: 06/29/2014 07:36    ASSESSMENT AND PLAN  1. Acute on chronic diastolic heart failure, NYHA class 4/Anasarca: Last Echo  01/19/2014 with an EF 55-60% with severe LVH. Renal function stable on current diuretic regimen.  We will switch her from IV Lasix to oral Lasix today.  Continue metolazone.  Renal function is stable  2. Essential hypertension: Stable on hydral/nitrate.   3. CKD (chronic kidney disease) stage 4:  4. Bradycardia: Stable. BB discontinued.   5. Moderate aortic stenosis: Echo from 01/19/2014 revealing a moderately thickened aortic valve with moderate stenosis and regurgitation. EF normal. Conservative rx.  6. Post-discharge SNF planned.  Anticipate DC Monday if stable.  Signed, Cassell Clementhomas Daisa Stennis MD

## 2014-07-12 ENCOUNTER — Encounter (HOSPITAL_COMMUNITY): Payer: Self-pay | Admitting: Physician Assistant

## 2014-07-12 ENCOUNTER — Telehealth: Payer: Self-pay | Admitting: Cardiovascular Disease

## 2014-07-12 DIAGNOSIS — R001 Bradycardia, unspecified: Secondary | ICD-10-CM | POA: Diagnosis present

## 2014-07-12 MED ORDER — METOLAZONE 2.5 MG PO TABS: 2.5000 mg | ORAL_TABLET | Freq: Every day | ORAL | Status: DC

## 2014-07-12 MED ORDER — FUROSEMIDE 80 MG PO TABS: 80.0000 mg | ORAL_TABLET | Freq: Three times a day (TID) | ORAL | Status: DC

## 2014-07-12 MED ORDER — POTASSIUM CHLORIDE CRYS ER 20 MEQ PO TBCR: 20.0000 meq | EXTENDED_RELEASE_TABLET | Freq: Two times a day (BID) | ORAL | Status: DC

## 2014-07-12 NOTE — Progress Notes (Signed)
Patient Name: Meghan Russo Date of Encounter: 07/12/2014     Principal Problem:   Acute diastolic HF (heart failure), NYHA class 4 Active Problems:   Moderate to severe pulmonary hypertension   Bradycardia by electrocardiogram - with sinus pauses & Wenkebach Block   Moderate aortic insufficiency   Hypertensive heart disease   CKD (chronic kidney disease) stage 4, GFR 15-29 ml/min   Anasarca    SUBJECTIVE  Feeling well. No CP or SOB. Ready to be discharged home to SNF.  CURRENT MEDS . acetaminophen  500 mg Oral BH-q7a  . aspirin EC  81 mg Oral Daily  . furosemide  80 mg Oral TID  . heparin  5,000 Units Subcutaneous 3 times per day  . hydrALAZINE  50 mg Oral 3 times per day  . isosorbide dinitrate  20 mg Oral TID  . metolazone  2.5 mg Oral Daily  . potassium chloride  20 mEq Oral BID  . sodium chloride  3 mL Intravenous Q12H    OBJECTIVE  Filed Vitals:   07/11/14 1800 07/11/14 2102 07/12/14 0622 07/12/14 0713  BP: 114/34 126/65 121/41   Pulse: 63 68 62   Temp: 97.5 F (36.4 C) 97.5 F (36.4 C) 98.3 F (36.8 C)   TempSrc: Oral Oral Oral   Resp: 18 20 18    Height:      Weight:    161 lb 6 oz (73.2 kg)  SpO2: 98% 97% 98%     Intake/Output Summary (Last 24 hours) at 07/12/14 0751 Last data filed at 07/12/14 0655  Gross per 24 hour  Intake    840 ml  Output   2500 ml  Net  -1660 ml   Filed Weights   07/10/14 0503 07/11/14 0533 07/12/14 0713  Weight: 171 lb 15.3 oz (78 kg) 167 lb 15.9 oz (76.2 kg) 161 lb 6 oz (73.2 kg)    PHYSICAL EXAM General: Pleasant, NAD. Neuro: Alert and oriented X 3. Moves all extremities spontaneously. Psych: Normal affect. HEENT: Normal Neck: Supple without bruits or JVD. Lungs: Resp regular and unlabored, Fine rales at bases. Heart: Regular. Grade 3/6 systolic murmur at aortic area. Abdomen: Soft, non-tender, non-distended, BS + x 4.  Extremities: No clubbing, cyanosis, trace edema. Bilateral leg ulcers with  dressings in place. Accessory Clinical Findings  CBC No results for input(s): WBC, NEUTROABS, HGB, HCT, MCV, PLT in the last 72 hours. Basic Metabolic Panel  Recent Labs  07/10/14 0414 07/11/14 0600  NA 141 140  K 4.2 4.2  CL 104 102  CO2 25 24  GLUCOSE 92 85  BUN 84* 80*  CREATININE 2.11* 1.96*  CALCIUM 8.7 8.7   TELE  Prolonged PR interval and some bradycardia  Radiology/Studies  Dg Chest Port 1 View  06/29/2014   CLINICAL DATA:  81 year old female with acute shortness of Breath. Initial encounter.  EXAM: PORTABLE CHEST - 1 VIEW  COMPARISON:  03/17/2014.  FINDINGS: Portable AP semi upright view at 0521 hrs. The patient is now rotated to the right. Stable cardiomegaly and mediastinal contours. Mildly regressed bilateral veiling opacity. Pulmonary vascularity is stable with suggestion of interstitial edema. No pneumothorax. No air bronchograms identified.  IMPRESSION: Stable cardiomegaly. Stable pulmonary interstitial edema. Right greater than left pleural effusions appear regressed since December.   Electronically Signed   By: Odessa FlemingH  Hall M.D.   On: 06/29/2014 07:36    ASSESSMENT AND PLAN  81 y/o female with h/o diastolic CHF, moderate aortic stenosis by echo, HTN and  CKD, admitted from the office 06/28/14 for dyspnea and volume overload secondary to acute on chronic diastolic CHF.  Dry Weight: 168-171 lb Admission Weight: 198 lb  1. Acute on Chronic Diastolic CHF/NYHA class 4/Anasarca:  -- Initially slow to diurese on IV lasix and AKI. Metolazone added 07/01/14. Now Net neg 21.2L since admission and weight down 37 lbs ( 198--> 161 lbs). She was switched from IV lasix to PO yesterday. Current regimen of Lasix  po TID and metolazone 2.5mg  qd.  -- Last Echo 01/19/2014 with an EF 55-60% with severe LVH. -- Continue daily weights, strict I/Os and low sodium diet.   2. Acute on Chronic Renal Insufficiency: Scr improved to baseline. Baseline Scr ~2.0. -- Admission SCr was 2.81.  Scr today is 1.96.   3. Moderate Aortic Stenosis: Echo from 01/19/2014 revealing a moderately thickened aortic valve with moderate stenosis and regurgitation. EF normal. -- Conservative rx.   4. HTN-Stable on hydral/nitrate.  5. Bradycardia: Stable. BB discontinued.    Dispo- Bed available at [facility]    Signed, Janetta Hora PA-C  Pager [phone]   Attending Note:   The patient was seen and examined.  Agree with assessment and plan as noted above.  Changes made to the above note as needed.  Elderly female ,  Still very weak.  Needs help to getting to the bathroom.  Her creatinine is stable .   She will need close follow up .    Vesta Mixer, Montez Hageman., MD, [facility] 07/12/2014, 8:26 AM [address]  [address] Office [phone] Pager [phone]

## 2014-07-12 NOTE — Telephone Encounter (Signed)
7 day TOC fu--appt with Lawson FiscalLori 07-19-14 at 1100am

## 2014-07-12 NOTE — Discharge Summary (Signed)
Discharge Summary   Patient ID: Meghan Russo MRN: [id number], DOB/AGE: [birth year] 81 y.o. Admit date: 06/28/2014 D/C date:     07/12/2014  Primary Cardiologist: Dr. Allyson Sabal   Principal Problem:   Acute diastolic HF (heart failure), NYHA class 4 Active Problems:   Moderate to severe pulmonary hypertension   Bradycardia by electrocardiogram - with sinus pauses & Wenkebach Block   Moderate aortic insufficiency   Hypertensive heart disease   CKD (chronic kidney disease) stage 4, GFR 15-29 ml/min   Anasarca   Bradycardia    Admission Dates: 06/28/14-07/12/14 Discharge Diagnosis: Acute on chronic diastolic CHF and acute on chronic renal failure. Discharge weight 161 lbs. Discharge creat 1.96  HPI: Meghan Russo is a 81 y.o. female with a history of chronic diastolic CHF, moderate AS, HTN and CKD who was admitted from the office 06/28/14 for dyspnea and volume overload secondary to acute on chronic diastolic CHF.  The patient admitted to [facility] in Dec 2015 for approximately a week (12/15-12/21/15) for shortness of breath, volume overload and acute on chronic diastolic heart failure. She was diuresed 17 pounds. She was seen in the office on 06/22/14 by Nada Boozer NP and her weight was up 35 lbs. It was attemped to manage her as an outpatient with more aggressive diuresis. However, despite aggressive attempts at treatment of edema with combined oral diuretics, she continued having very severe and symptomatic fluid overload, primarily right heart failure symptoms. She was seen back in the clinic on 06/28/14 and it was felt that she required hospitalization for IV diuretics with close monitoring of renal function.   Hospital Course  1. Acute on Chronic Diastolic CHF/NYHA class 4/Anasarca: Dry Weight: 168-171 lb. Admission Weight: 198 lb -- Initially slow to diurese on IV lasix and with AKI. Metolazone added 07/01/14. Foley placed. Now Net neg 21.2L since admission and weight down 37 lbs ( 198--> 161  lbs). She was switched from IV lasix to PO on 07/11/14 and has continued to diurese. She will be continued on current regimen of Lasix  po TID and metolazone 2.5mg  qd.  -- Last Echo 01/19/2014 with an EF 55-60% with severe LVH. -- Continue daily weights, strict I/Os and low sodium diet.   2. Acute on Chronic Renal Insufficiency: Scr improved to baseline of ~2.0. -- Admission SCr was 2.81. Scr today is 1.96. She is on no nephrotoxic medications.   3. Moderate Aortic Stenosis: Echo from 01/19/2014 revealing a moderately thickened aortic valve with moderate stenosis and regurgitation. EF normal. -- Conservative rx.   4. HTN- stable on hydral/nitrate. -- She did have some issues with low DBP which improved after CCB and BB were discontinued.   5. Bradycardia: Stable. BB discontinued.   Dispo- Bed available at [facility].   The patient has had an uncomplicated hospital course and is recovering well. She has been seen by Dr. Elease Hashimoto today and deemed ready for discharge to SNF. All follow-up appointments have been scheduled. Discharge medications are listed below.   Discharge Vitals: Blood pressure 121/41, pulse 62, temperature 98.3 F (36.8 C), temperature source Oral, resp. rate 18, height  (1.626 m), weight 161 lb 6 oz (73.2 kg), SpO2 98 %.  Labs: Lab Results  Component Value Date   WBC 4.3 06/28/2014   HGB 8.1* 06/28/2014   HCT 24.7* 06/28/2014   MCV 96.5 06/28/2014   PLT 153 06/28/2014     Recent Labs Lab 07/11/14 0600  NA 140  K 4.2  CL 102  CO2 24  BUN 80*  CREATININE 1.96*  CALCIUM 8.7  GLUCOSE 85    Diagnostic Studies/Procedures   Dg Chest Port 1 View  06/29/2014   CLINICAL DATA:  81 year old female with acute shortness of Breath. Initial encounter.  EXAM: PORTABLE CHEST - 1 VIEW  COMPARISON:  03/17/2014.  FINDINGS: Portable AP semi upright view at 0521 hrs. The patient is now rotated to the right. Stable cardiomegaly and mediastinal  contours. Mildly regressed bilateral veiling opacity. Pulmonary vascularity is stable with suggestion of interstitial edema. No pneumothorax. No air bronchograms identified.  IMPRESSION: Stable cardiomegaly. Stable pulmonary interstitial edema. Right greater than left pleural effusions appear regressed since December.   Electronically Signed   By: Odessa FlemingH  Hall M.D.   On: 06/29/2014 07:36     2D ECHO Study Date: 01/19/2014 LV EF: 55% -   60% Study Conclusions - Left ventricle: The cavity size was normal. Wall thickness was   increased in a pattern of severe LVH. Systolic function was   normal. The estimated ejection fraction was in the range of 55%   to 60%. Wall motion was normal; there were no regional wall   motion abnormalities. Doppler parameters are consistent with   restrictive physiology, indicative of decreased left ventricular   diastolic compliance and/or increased left atrial pressure. - Aortic valve: There was moderate stenosis. There was moderate   regurgitation. Valve area (VTI): 1.1 cm^2. Valve area (Vmax):   1.08 cm^2. Valve area (Vmean): 1.21 cm^2. - Mitral valve: Calcified annulus. Mildly thickened leaflets .   There was mild regurgitation. - Right ventricle: The cavity size was moderately dilated. - Right atrium: The atrium was severely dilated. - Tricuspid valve: There was severe regurgitation. - Pulmonary arteries: PA peak pressure: 39 mm Hg (S).     Discharge Medications     Medication List    STOP taking these medications        amLODipine 5 MG tablet  Commonly known as:  NORVASC     carvedilol 6.25 MG tablet  Commonly known as:  COREG      TAKE these medications        acetaminophen 500 MG tablet  Commonly known as:  TYLENOL  Take 500 mg by mouth every morning.     aspirin 81 MG tablet  Take 81 mg by mouth daily.     furosemide 80 MG tablet  Commonly known as:  LASIX  Take 1 tablet (80 mg total) by mouth 3 (three) times daily.     hydrALAZINE  50 MG tablet  Commonly known as:  APRESOLINE  TAKE 1 TABLET (50 MG TOTAL) BY MOUTH 3 (THREE) TIMES DAILY.     isosorbide dinitrate 20 MG tablet  Commonly known as:  ISORDIL  Take 1 tablet (20 mg total) by mouth 3 (three) times daily.     ketoconazole 2 % cream  Commonly known as:  NIZORAL  Apply 1 application topically daily.     metolazone 2.5 MG tablet  Commonly known as:  ZAROXOLYN  Take 1 tablet (2.5 mg total) by mouth daily. 30 minutes  prior to morning furosemide dose.     nystatin 100000 UNIT/GM Powd  Apply 1 gram topically between toes bid     potassium chloride SA 20 MEQ tablet  Commonly known as:  K-DUR,KLOR-CON  Take 1 tablet (20 mEq total) by mouth 2 (two) times daily.        Disposition   The patient will be discharged  in stable condition to SNF.  Follow-up Information    Follow up with Norma Fredrickson, NP On 07/19/2014.   Specialty:  Nurse Practitioner   Why:  @ 11am   Contact information:   [address] [place] [address] [phone]         Duration of Discharge Encounter: Greater than 30 minutes including physician and PA time.  SignedCline Crock R PA-C 07/12/2014, 8:55 AM  Attending Note:   The patient was seen and examined.  Agree with assessment and plan as noted above.  Changes made to the above note as needed.  See my note from earlier today.  Vesta Mixer, Montez Hageman., MD, [facility] 07/12/2014, 5:33 PM [address],  [address] Office [phone] Pager [phone]

## 2014-07-12 NOTE — Progress Notes (Signed)
Report called to [facility]. [facility] given to Nap[place]-HonokowaiStacy, Charity fundraiserN. Ambulance is scheduled for 11:15. SBAR used to give report. Opportunity to ask questions was given and callback number provided.

## 2014-07-15 ENCOUNTER — Ambulatory Visit: Payer: Medicare Other | Admitting: Cardiology

## 2014-07-19 ENCOUNTER — Encounter: Payer: Self-pay | Admitting: Nurse Practitioner

## 2014-07-19 ENCOUNTER — Ambulatory Visit (INDEPENDENT_AMBULATORY_CARE_PROVIDER_SITE_OTHER): Payer: Medicare Other | Admitting: Nurse Practitioner

## 2014-07-19 VITALS — BP 108/38 | HR 69 | Resp 18 | Ht 66.0 in | Wt 163.0 lb

## 2014-07-19 DIAGNOSIS — I5032 Chronic diastolic (congestive) heart failure: Secondary | ICD-10-CM | POA: Diagnosis not present

## 2014-07-19 DIAGNOSIS — N183 Chronic kidney disease, stage 3 unspecified: Secondary | ICD-10-CM

## 2014-07-19 DIAGNOSIS — I10 Essential (primary) hypertension: Secondary | ICD-10-CM | POA: Diagnosis not present

## 2014-07-19 LAB — BASIC METABOLIC PANEL
BUN: 103 mg/dL (ref 6–23)
CO2: 28 mEq/L (ref 19–32)
Calcium: 9.4 mg/dL (ref 8.4–10.5)
Chloride: 102 mEq/L (ref 96–112)
Creatinine, Ser: 2.33 mg/dL — ABNORMAL HIGH (ref 0.40–1.20)
GFR: 25.11 mL/min — ABNORMAL LOW (ref >60.00)
Glucose, Bld: 89 mg/dL (ref 70–99)
Potassium: 4.3 mEq/L (ref 3.5–5.1)
Sodium: 138 mEq/L (ref 135–145)

## 2014-07-19 LAB — CBC
HCT: 25.6 % — ABNORMAL LOW (ref 36.0–46.0)
Hemoglobin: 8.5 g/dL — ABNORMAL LOW (ref 12.0–15.0)
MCHC: 33.1 g/dL (ref 30.0–36.0)
MCV: 97.7 fl (ref 78.0–100.0)
Platelets: 178 10*3/uL (ref 150.0–400.0)
RBC: 2.62 Mil/uL — ABNORMAL LOW (ref 3.87–5.11)
RDW: 15 % (ref 11.5–15.5)
WBC: 6.3 10*3/uL (ref 4.0–10.5)

## 2014-07-19 LAB — BRAIN NATRIURETIC PEPTIDE: Pro B Natriuretic peptide (BNP): 768 pg/mL — ABNORMAL HIGH (ref 0.0–100.0)

## 2014-07-19 MED ORDER — POTASSIUM CHLORIDE CRYS ER 20 MEQ PO TBCR: 20.0000 meq | EXTENDED_RELEASE_TABLET | Freq: Every day | ORAL | Status: DC

## 2014-07-19 NOTE — Progress Notes (Signed)
CARDIOLOGY OFFICE NOTE  Date:  07/19/2014    Meghan Russo Date of Birth: 07/12/1920 Medical Record #[id number]  PCP:  Gwynneth AlimentSANDERS,ROBYN N, MD  Cardiologist:  Allyson SabalBerry    Chief Complaint  Patient presents with  . Congestive Heart Failure    TOC/post hospital - seen for Dr. Allyson SabalBerry     History of Present Illness: Meghan Russo is a 81 y.o. female who presents today for a post hospital/TOC visit. Seen for Dr. Allyson SabalBerry. She has a history of chronic diastolic CHF, moderate AS, HTN and CKD.   She was admitted from the office 06/28/14 for dyspnea and volume overload secondary to acute on chronic diastolic CHF. [facility] for approximately a week (12/15-12/21/15) for shortness of breath, volume overload and acute on chronic diastolic heart failure. She was diuresed 17 pounds. She was seen in the office on 06/22/14 by Nada BoozerLaura Ingold NP and her weight was up 35 lbs. It was attemped to manage her as an outpatient with more aggressive diuresis. However, despite aggressive attempts at treatment of edema with combined oral diuretics, she continued having very severe and symptomatic fluid overload, primarily right heart failure symptoms. She was seen back in the clinic on 06/28/14 and it was felt that she required hospitalization for IV diuretics with close monitoring of renal function.  She was again diuresed. Dry Weight: 168-171 lb. Admission Weight: 198 lb. Needed metolazone. Meds were cut back due to bradycardia and low BP. Discharged to SNF.   Comes in today. Here with her 2 daughters. Has been out of the hospital one week. Discharged to SNF. Not determined her LOS yet. Weight is staying down. Swelling has improved. Concerned about ankle ulcer. More controlled environment now which may help improve her disposition. No chest pain. Denies being short of breath. No papers sent from SNF including no medicine list.   Past Medical History  Diagnosis Date  . Hypertension   . Knee  pain   . Chronic kidney disease, stage III (moderate)   . Moderate aortic stenosis 12/2013  . Moderate to severe pulmonary hypertension   . Arthritis   . Cataract   . Bradycardia     a. 03/2014: HR 30's, sinus pauses and Wenkebach - BB discontinued.  . Severe tricuspid regurgitation 12/2013  . Moderate aortic insufficiency 12/2013  . Mild mitral regurgitation 12/2013  . Chronic diastolic CHF (congestive heart failure)   . Anemia     Past Surgical History  Procedure Laterality Date  . Vericose vein surgery    . Cataract extraction    . Inguinal lymph node biopsy Right 08/2011     Medications: Current Outpatient Prescriptions  Medication Sig Dispense Refill  . acetaminophen (TYLENOL) 500 MG tablet Take 500 mg by mouth every morning.    Marland Kitchen. aspirin 81 MG tablet Take 81 mg by mouth daily.    . furosemide (LASIX) 80 MG tablet Take 1 tablet (80 mg total) by mouth 3 (three) times daily. 90 tablet 11  . hydrALAZINE (APRESOLINE) 50 MG tablet TAKE 1 TABLET (50 MG TOTAL) BY MOUTH 3 (THREE) TIMES DAILY. 270 tablet 2  . isosorbide dinitrate (ISORDIL) 20 MG tablet Take 1 tablet (20 mg total) by mouth 3 (three) times daily. 90 tablet 5  . ketoconazole (NIZORAL) 2 % cream Apply 1 application topically daily. 60 g 2  . metolazone (ZAROXOLYN) 2.5 MG tablet Take 1 tablet (2.5 mg total) by mouth daily. 30 minutes  prior to morning furosemide dose. 30 tablet 1  . potassium chloride SA (K-DUR,KLOR-CON) 20 MEQ tablet Take 1 tablet (20 mEq total) by mouth daily. 60 tablet 11  . nystatin (MYCOSTATIN/NYSTOP) 100000 UNIT/GM POWD Apply 1 gram topically between toes bid (Patient not taking: Reported on 07/19/2014) 60 g 3   No current facility-administered medications for this visit.    Allergies: Allergies  Allergen Reactions  . Asa [Aspirin] Other (See Comments)    GI Upset    Social History: The patient  reports that she has never smoked. She has never used smokeless tobacco. She reports that she  does not drink alcohol or use illicit drugs.   Family History: The patient's family history includes Hypertension in an other family member.   Review of Systems: Please see the history of present illness.   Otherwise, the review of systems is positive for fatigue.   All other systems are reviewed and negative.   Physical Exam: VS:  BP 108/38 mmHg  Pulse 69  Resp 18  Ht  (1.676 m)  Wt 163 lb (73.936 kg)  BMI 26.32 kg/m2  SpO2 98% .  BMI Body mass index is 26.32 kg/(m^2).  Wt Readings from Last 3 Encounters:  07/19/14 163 lb (73.936 kg)  07/12/14 161 lb 6 oz (73.2 kg)  06/28/14 203 lb 1.6 oz (92.126 kg)    General: Chronically ill appearing. She is in no acute distress.  HEENT: Normal. Neck: Supple, no JVD, carotid bruits, or masses noted.  Cardiac: Regular rate and rhythm. She has a harsh outflow murmur. Chronic 1+ edema of legs noted.  Respiratory:  Lungs are clear to auscultation bilaterally with normal work of breathing.  GI: Soft and nontender.  MS: No deformity or atrophy. Gait and ROM intact. Skin: Warm and dry. Color is normal.  Neuro:  Strength and sensation are intact and no gross focal deficits noted.  Psych: Alert, appropriate and with normal affect.   LABORATORY DATA:  EKG:  EKG is not ordered today.   Lab Results  Component Value Date   WBC 4.3 06/28/2014   HGB 8.1* 06/28/2014   HCT 24.7* 06/28/2014   PLT 153 06/28/2014   GLUCOSE 85 07/11/2014   ALT 9 06/28/2014   AST 16 06/28/2014   NA 140 07/11/2014   K 4.2 07/11/2014   CL 102 07/11/2014   CREATININE 1.96* 07/11/2014   BUN 80* 07/11/2014   CO2 24 07/11/2014   TSH 2.543 06/28/2014   INR 1.29 06/28/2014    BNP (last 3 results)  Recent Labs  06/28/14 1840  BNP 1593.3*    ProBNP (last 3 results)  Recent Labs  01/19/14 0100 03/16/14 1400 03/18/14 0428  PROBNP 13095.0* 19829.0* 20357.0*     Other Studies Reviewed Today:  Echo Study Conclusions from 12/2013  - Left  ventricle: The cavity size was normal. Wall thickness was increased in a pattern of severe LVH. Systolic function was normal. The estimated ejection fraction was in the range of 55% to 60%. Wall motion was normal; there were no regional wall motion abnormalities. Doppler parameters are consistent with restrictive physiology, indicative of decreased left ventricular diastolic compliance and/or increased left atrial pressure. - Aortic valve: There was moderate stenosis. There was moderate regurgitation. Valve area (VTI): 1.1 cm^2. Valve area (Vmax): 1.08 cm^2. Valve area (Vmean): 1.21 cm^2. - Mitral valve: Calcified annulus. Mildly thickened leaflets . There was mild regurgitation. - Right ventricle: The cavity size was moderately dilated. - Right atrium: The atrium  was severely dilated. - Tricuspid valve: There was severe regurgitation. - Pulmonary arteries: PA peak pressure: 39 mm Hg (S).   Assessment/Plan: 1. Diastolic heart failure - recurrent exacerbation - has had several in past several months - may need to consider palliative care. Seems compensated at this time. More controlled environment seems to be making a + impact so far. Overall prognosis tenuous. Trying to verify her medicine list from the facility.   2. Advanced age  81. HTN - BP ok on current regimen.   4. Valvular heart disease - managed conservatively  5. Anemia - most likely chronic disease - rechecking labs today.   6. CKD - suspect this will be worse given the addition of Zaroxolyn/Lasix TID - rechecking labs today. May need to cut back.   Current medicines are reviewed with the patient today.  The patient does not have concerns regarding medicines other than what has been noted above.  The following changes have been made:  See above.  Labs/ tests ordered today include:    Orders Placed This Encounter  Procedures  . Basic metabolic panel  . CBC  . Brain natriuretic peptide      Disposition:   FU with Dr. Allyson Sabal in 3 weeks or PA at [facility].   Patient is agreeable to this plan and will call if any problems develop in the interim.   Signed: Rosalio Macadamia, RN, ANP-C 07/19/2014 11:36 AM  [facility] [address] [place], [place]  [address] Phone: [phone] Fax: [phone]

## 2014-07-19 NOTE — Patient Instructions (Addendum)
Medication Instructions:  Continue with current medicines as listed  Labwork:  We will be checking the following labs today CBC, BNP and BMET   Testing/Procedures: N/A  Follow-Up: See Dr. Allyson SabalBerry or [facility] 4 weeks.  Would ask house doctor to evaluate ankle ulcer.   Call the [facility] office at [phone] if you have any questions, problems or concerns.

## 2014-08-17 ENCOUNTER — Ambulatory Visit (INDEPENDENT_AMBULATORY_CARE_PROVIDER_SITE_OTHER): Payer: Medicare Other | Admitting: Cardiovascular Disease

## 2014-08-17 ENCOUNTER — Encounter: Payer: Self-pay | Admitting: Cardiovascular Disease

## 2014-08-17 VITALS — BP 106/48 | HR 84 | Ht 66.0 in | Wt 167.8 lb

## 2014-08-17 DIAGNOSIS — I10 Essential (primary) hypertension: Secondary | ICD-10-CM | POA: Diagnosis not present

## 2014-08-17 DIAGNOSIS — I350 Nonrheumatic aortic (valve) stenosis: Secondary | ICD-10-CM | POA: Diagnosis not present

## 2014-08-17 DIAGNOSIS — Z79899 Other long term (current) drug therapy: Secondary | ICD-10-CM

## 2014-08-17 DIAGNOSIS — I5031 Acute diastolic (congestive) heart failure: Secondary | ICD-10-CM

## 2014-08-17 NOTE — Progress Notes (Signed)
08/17/2014 Meghan Russo   11/26/1920  [id number]  Primary Physician Gwynneth AlimentSANDERS,ROBYN N, MD Primary Cardiologist: Runell GessJonathan J. Berry MD Roseanne RenoFACP,FACC,FAHA, FSCAI    HPI: The patient is a 81 year old, thin and frail-appearing widowed [place] female, mother of 3, grandmother to 2 grandchildren accompanied by 2 of her daughters today. I saw her 4 months ago. She has a history of labile hypertension, negative renal Dopplers and valvular heart disease with mild AI, mild to moderate AS, moderate MR and severe TR with severe pulmonary hypertension. She was admitted for acute on chronic diastolic heart failure on 03/16/14 for one week. She gained 30 pounds and diuresed approximately 20 at discharge with a dry weight of 171 pounds. She was readmitted on 06/28/14 and diuresed 21 pounds. She does have moderate renal insufficiency with discharge creatinine of 1.96 at discharge weight of 161 pounds.. She is in a nursing facility until this Thursday when she'll be discharged home under the care of her daughters.  Current Outpatient Prescriptions  Medication Sig Dispense Refill  . acetaminophen (TYLENOL) 500 MG tablet Take 500 mg by mouth every morning.    Marland Kitchen. aspirin 81 MG tablet Take 81 mg by mouth daily.    . furosemide (LASIX) 80 MG tablet Take 1 tablet (80 mg total) by mouth 3 (three) times daily. 90 tablet 11  . hydrALAZINE (APRESOLINE) 50 MG tablet TAKE 1 TABLET (50 MG TOTAL) BY MOUTH 3 (THREE) TIMES DAILY. 270 tablet 2  . isosorbide dinitrate (ISORDIL) 20 MG tablet Take 1 tablet (20 mg total) by mouth 3 (three) times daily. 90 tablet 5  . potassium chloride SA (K-DUR,KLOR-CON) 20 MEQ tablet Take 1 tablet (20 mEq total) by mouth daily. 60 tablet 11   No current facility-administered medications for this visit.    Allergies  Allergen Reactions  . Asa [Aspirin] Other (See Comments)    GI Upset    History   Social History  . Marital Status: Widowed    Spouse Name: N/A  . Number of  Children: N/A  . Years of Education: N/A   Occupational History  . Not on file.   Social History Main Topics  . Smoking status: Never Smoker   . Smokeless tobacco: Never Used  . Alcohol Use: No  . Drug Use: No  . Sexual Activity: Not on file   Other Topics Concern  . Not on file   Social History Narrative     Review of Systems: General: negative for chills, fever, night sweats or weight changes.  Cardiovascular: negative for chest pain, dyspnea on exertion, edema, orthopnea, palpitations, paroxysmal nocturnal dyspnea or shortness of breath Dermatological: negative for rash Respiratory: negative for cough or wheezing Urologic: negative for hematuria Abdominal: negative for nausea, vomiting, diarrhea, bright red blood per rectum, melena, or hematemesis Neurologic: negative for visual changes, syncope, or dizziness All other systems reviewed and are otherwise negative except as noted above.    Blood pressure 106/48, pulse 84, height 5\' 6"  (1.676 m), weight 167 lb 12.8 oz (76.114 kg).  General appearance: alert and no distress Neck: no adenopathy, no carotid bruit, no JVD, supple, symmetrical, trachea midline and thyroid not enlarged, symmetric, no tenderness/mass/nodules Lungs: clear to auscultation bilaterally Heart: 2/6 outflow tract murmur and apical systolic murmur Extremities: extremities normal, atraumatic, no cyanosis or edema  EKG not performed today  ASSESSMENT AND PLAN:   Acute diastolic HF (heart failure), NYHA class 4 This lady has had multiple admissions for volume overload related to diastolic  heart failure. She was admitted in December and had 31 pounds diuresed and again on 06/28/2014 at which time she was I'm overloaded and had 21 pounds diuresed. She does watch her salt intake. She has valvular heart disease as well. I suspect recurrent admissions are going to be fairly common for her in the future.   Moderate aortic stenosis Valvular heart disease with  moderate AS/AI and MR as well as severe TR with pulmonary hypertension. At this point conservative therapy is the best option given her age and comorbidities   Hypertension History of hypertension blood pressure measured at 106/48. She is on hydralazine and isosorbide. Continue current meds at current dosing       Runell GessJonathan J. Berry MD [facility] Orthopedic HospitalFACP,FACC,FAHA, [facility] Surgery CenterFSCAI 08/17/2014 2:53 PM

## 2014-08-17 NOTE — Patient Instructions (Addendum)
  We will see you back in follow up in 3, 6, and 9 months with an extender and in 1 year with Dr Allyson SabalBerry.   Dr Allyson SabalBerry has ordered: 1. Your physician recommends that you weigh, daily, at the same time every day, and in the same amount of clothing. Please record your daily weights on the handout provided and bring it to your next appointment. Call [phone] if weight climbs above 170 or if you have any increased shortness of breath or swelling.  Please have no salt to very little salt in your diet.  No more than 2000 mg in a day.  2. Your physician recommends that you return for lab work.

## 2014-08-17 NOTE — Assessment & Plan Note (Signed)
Valvular heart disease with moderate AS/AI and MR as well as severe TR with pulmonary hypertension. At this point conservative therapy is the best option given her age and comorbidities

## 2014-08-17 NOTE — Assessment & Plan Note (Signed)
History of hypertension blood pressure measured at 106/48. She is on hydralazine and isosorbide. Continue current meds at current dosing

## 2014-08-17 NOTE — Assessment & Plan Note (Signed)
This lady has had multiple admissions for volume overload related to diastolic heart failure. She was admitted in December and had 31 pounds diuresed and again on 06/28/2014 at which time she was I'm overloaded and had 21 pounds diuresed. She does watch her salt intake. She has valvular heart disease as well. I suspect recurrent admissions are going to be fairly common for her in the future.

## 2014-08-24 ENCOUNTER — Telehealth: Payer: Self-pay | Admitting: Cardiovascular Disease

## 2014-08-24 NOTE — Telephone Encounter (Signed)
Home care nurse calling regarding initial patient encounter. Noted pt has redness and swelling in left hand consistent w/ her history of gout.   Advised to contact primary care on file - gave number for Dr. Alver FisherShaw's office. Nurse verbalized understanding.

## 2014-09-15 ENCOUNTER — Telehealth: Payer: Self-pay | Admitting: Cardiovascular Disease

## 2014-09-15 NOTE — Telephone Encounter (Signed)
Spoke with Meghan Russo, patients dtr has refused OT at this time.

## 2014-09-22 ENCOUNTER — Inpatient Hospital Stay (HOSPITAL_COMMUNITY)
Admission: EM | Admit: 2014-09-22 | Discharge: 2014-09-27 | DRG: 378 | Disposition: A | Discharge status: Skilled Nursing Facility | Payer: Medicare Other | Attending: Internal Medicine | Admitting: Internal Medicine

## 2014-09-22 ENCOUNTER — Encounter (HOSPITAL_COMMUNITY): Payer: Self-pay

## 2014-09-22 DIAGNOSIS — I272 Other secondary pulmonary hypertension: Secondary | ICD-10-CM | POA: Diagnosis present

## 2014-09-22 DIAGNOSIS — M1990 Unspecified osteoarthritis, unspecified site: Secondary | ICD-10-CM | POA: Diagnosis present

## 2014-09-22 DIAGNOSIS — I499 Cardiac arrhythmia, unspecified: Secondary | ICD-10-CM | POA: Diagnosis present

## 2014-09-22 DIAGNOSIS — Z79899 Other long term (current) drug therapy: Secondary | ICD-10-CM

## 2014-09-22 DIAGNOSIS — D696 Thrombocytopenia, unspecified: Secondary | ICD-10-CM | POA: Diagnosis present

## 2014-09-22 DIAGNOSIS — L8992 Pressure ulcer of unspecified site, stage 2: Secondary | ICD-10-CM | POA: Diagnosis present

## 2014-09-22 DIAGNOSIS — R011 Cardiac murmur, unspecified: Secondary | ICD-10-CM | POA: Diagnosis present

## 2014-09-22 DIAGNOSIS — M810 Age-related osteoporosis without current pathological fracture: Secondary | ICD-10-CM | POA: Diagnosis present

## 2014-09-22 DIAGNOSIS — Z886 Allergy status to analgesic agent status: Secondary | ICD-10-CM | POA: Diagnosis not present

## 2014-09-22 DIAGNOSIS — I441 Atrioventricular block, second degree: Secondary | ICD-10-CM | POA: Diagnosis present

## 2014-09-22 DIAGNOSIS — Z833 Family history of diabetes mellitus: Secondary | ICD-10-CM

## 2014-09-22 DIAGNOSIS — I083 Combined rheumatic disorders of mitral, aortic and tricuspid valves: Secondary | ICD-10-CM | POA: Diagnosis present

## 2014-09-22 DIAGNOSIS — K922 Gastrointestinal hemorrhage, unspecified: Secondary | ICD-10-CM | POA: Diagnosis not present

## 2014-09-22 DIAGNOSIS — Z993 Dependence on wheelchair: Secondary | ICD-10-CM

## 2014-09-22 DIAGNOSIS — Z8249 Family history of ischemic heart disease and other diseases of the circulatory system: Secondary | ICD-10-CM

## 2014-09-22 DIAGNOSIS — R188 Other ascites: Secondary | ICD-10-CM | POA: Diagnosis not present

## 2014-09-22 DIAGNOSIS — D649 Anemia, unspecified: Secondary | ICD-10-CM

## 2014-09-22 DIAGNOSIS — I10 Essential (primary) hypertension: Secondary | ICD-10-CM | POA: Diagnosis present

## 2014-09-22 DIAGNOSIS — D62 Acute posthemorrhagic anemia: Secondary | ICD-10-CM | POA: Diagnosis present

## 2014-09-22 DIAGNOSIS — N179 Acute kidney failure, unspecified: Secondary | ICD-10-CM | POA: Diagnosis present

## 2014-09-22 DIAGNOSIS — N184 Chronic kidney disease, stage 4 (severe): Secondary | ICD-10-CM | POA: Diagnosis not present

## 2014-09-22 DIAGNOSIS — I129 Hypertensive chronic kidney disease with stage 1 through stage 4 chronic kidney disease, or unspecified chronic kidney disease: Secondary | ICD-10-CM | POA: Diagnosis present

## 2014-09-22 DIAGNOSIS — K5733 Diverticulitis of large intestine without perforation or abscess with bleeding: Secondary | ICD-10-CM | POA: Diagnosis not present

## 2014-09-22 DIAGNOSIS — K921 Melena: Secondary | ICD-10-CM | POA: Diagnosis present

## 2014-09-22 DIAGNOSIS — K625 Hemorrhage of anus and rectum: Secondary | ICD-10-CM | POA: Diagnosis present

## 2014-09-22 DIAGNOSIS — R52 Pain, unspecified: Secondary | ICD-10-CM

## 2014-09-22 DIAGNOSIS — L8990 Pressure ulcer of unspecified site, unspecified stage: Secondary | ICD-10-CM | POA: Diagnosis not present

## 2014-09-22 DIAGNOSIS — Z7982 Long term (current) use of aspirin: Secondary | ICD-10-CM

## 2014-09-22 DIAGNOSIS — I5032 Chronic diastolic (congestive) heart failure: Secondary | ICD-10-CM | POA: Diagnosis not present

## 2014-09-22 DIAGNOSIS — N183 Chronic kidney disease, stage 3 (moderate): Secondary | ICD-10-CM | POA: Diagnosis not present

## 2014-09-22 DIAGNOSIS — I5033 Acute on chronic diastolic (congestive) heart failure: Secondary | ICD-10-CM | POA: Diagnosis present

## 2014-09-22 HISTORY — DX: Disease of pericardium, unspecified: I31.9

## 2014-09-22 HISTORY — DX: Age-related osteoporosis without current pathological fracture: M81.0

## 2014-09-22 HISTORY — DX: Endocarditis, valve unspecified: I38

## 2014-09-22 LAB — COMPREHENSIVE METABOLIC PANEL
ALBUMIN: 3.1 g/dL — AB (ref 3.5–5.0)
ALT: 9 U/L — AB (ref 14–54)
AST: 14 U/L — AB (ref 15–41)
Alkaline Phosphatase: 101 U/L (ref 38–126)
Anion gap: 9 (ref 5–15)
BUN: 106 mg/dL — ABNORMAL HIGH (ref 6–20)
CALCIUM: 8.5 mg/dL — AB (ref 8.9–10.3)
CO2: 18 mmol/L — AB (ref 22–32)
CREATININE: 2.84 mg/dL — AB (ref 0.44–1.00)
Chloride: 115 mmol/L — ABNORMAL HIGH (ref 101–111)
GFR calc Af Amer: 16 mL/min — ABNORMAL LOW (ref >60)
GFR, EST NON AFRICAN AMERICAN: 13 mL/min — AB (ref >60)
Glucose, Bld: 91 mg/dL (ref 65–99)
Potassium: 5.4 mmol/L — ABNORMAL HIGH (ref 3.5–5.1)
Sodium: 142 mmol/L (ref 135–145)
TOTAL PROTEIN: 7.1 g/dL (ref 6.5–8.1)
Total Bilirubin: 0.7 mg/dL (ref 0.3–1.2)

## 2014-09-22 LAB — CBC
HCT: 18 % — ABNORMAL LOW (ref 36.0–46.0)
Hemoglobin: 5.9 g/dL — CL (ref 12.0–15.0)
MCH: 31.6 pg (ref 26.0–34.0)
MCHC: 32.8 g/dL (ref 30.0–36.0)
MCV: 96.3 fL (ref 78.0–100.0)
PLATELETS: 128 10*3/uL — AB (ref 150–400)
RBC: 1.87 MIL/uL — AB (ref 3.87–5.11)
RDW: 16.5 % — ABNORMAL HIGH (ref 11.5–15.5)
WBC: 7 10*3/uL (ref 4.0–10.5)

## 2014-09-22 LAB — PREPARE RBC (CROSSMATCH)

## 2014-09-22 LAB — PROTIME-INR
INR: 1.34 (ref 0.00–1.49)
Prothrombin Time: 16.7 seconds — ABNORMAL HIGH (ref 11.6–15.2)

## 2014-09-22 LAB — MRSA PCR SCREENING: MRSA by PCR: POSITIVE — AB

## 2014-09-22 LAB — POC OCCULT BLOOD, ED: Fecal Occult Bld: POSITIVE — AB

## 2014-09-22 LAB — ABO/RH: ABO/RH(D): O POS

## 2014-09-22 MED ORDER — FUROSEMIDE 10 MG/ML IJ SOLN
20.0000 mg | Freq: Once | INTRAMUSCULAR | Status: AC
  Administered 2014-09-22: 20 mg via INTRAVENOUS
  Filled 2014-09-22: qty 4

## 2014-09-22 MED ORDER — PANTOPRAZOLE SODIUM 40 MG IV SOLR
8.0000 mg/h | INTRAVENOUS | Status: DC
  Administered 2014-09-22: 8 mg/h via INTRAVENOUS
  Filled 2014-09-22 (×2): qty 80

## 2014-09-22 MED ORDER — ACETAMINOPHEN 325 MG PO TABS
650.0000 mg | ORAL_TABLET | Freq: Once | ORAL | Status: AC
  Administered 2014-09-22: 650 mg via ORAL
  Filled 2014-09-22: qty 2

## 2014-09-22 MED ORDER — ACETAMINOPHEN 325 MG PO TABS
650.0000 mg | ORAL_TABLET | Freq: Four times a day (QID) | ORAL | Status: DC | PRN
  Administered 2014-09-26: 650 mg via ORAL

## 2014-09-22 MED ORDER — SODIUM CHLORIDE 0.9 % IV SOLN
Freq: Once | INTRAVENOUS | Status: AC
  Administered 2014-09-22: 19:00:00 via INTRAVENOUS

## 2014-09-22 MED ORDER — ACETAMINOPHEN 650 MG RE SUPP: 650.0000 mg | Freq: Four times a day (QID) | RECTAL | Status: DC | PRN

## 2014-09-22 MED ORDER — PANTOPRAZOLE SODIUM 40 MG IV SOLR
40.0000 mg | Freq: Two times a day (BID) | INTRAVENOUS | Status: DC
Start: 2014-09-26 — End: 2014-09-23

## 2014-09-22 MED ORDER — SODIUM CHLORIDE 0.9 % IV SOLN
Freq: Once | INTRAVENOUS | Status: AC
  Administered 2014-09-22: 22:00:00 via INTRAVENOUS

## 2014-09-22 MED ORDER — HYDROCODONE-ACETAMINOPHEN 5-325 MG PO TABS
1.0000 | ORAL_TABLET | ORAL | Status: DC | PRN
  Administered 2014-09-25 – 2014-09-27 (×3): 1 via ORAL
  Filled 2014-09-22 (×3): qty 1

## 2014-09-22 MED ORDER — ONDANSETRON HCL 4 MG PO TABS: 4.0000 mg | ORAL_TABLET | Freq: Four times a day (QID) | ORAL | Status: DC | PRN

## 2014-09-22 MED ORDER — DIPHENHYDRAMINE HCL 25 MG PO CAPS
25.0000 mg | ORAL_CAPSULE | Freq: Once | ORAL | Status: AC
  Administered 2014-09-22: 25 mg via ORAL
  Filled 2014-09-22: qty 1

## 2014-09-22 MED ORDER — SODIUM CHLORIDE 0.9 % IJ SOLN
3.0000 mL | Freq: Two times a day (BID) | INTRAMUSCULAR | Status: DC
  Administered 2014-09-22 – 2014-09-24 (×4): 3 mL via INTRAVENOUS

## 2014-09-22 MED ORDER — SODIUM CHLORIDE 0.9 % IV SOLN
80.0000 mg | Freq: Once | INTRAVENOUS | Status: AC
  Administered 2014-09-22: 80 mg via INTRAVENOUS
  Filled 2014-09-22: qty 80

## 2014-09-22 MED ORDER — ONDANSETRON HCL 4 MG/2ML IJ SOLN: 4.0000 mg | Freq: Four times a day (QID) | INTRAMUSCULAR | Status: DC | PRN

## 2014-09-22 NOTE — ED Notes (Signed)
2 attempts to start an IV and obtain rest of the blood work without success, Dr Criss Alvine notified

## 2014-09-22 NOTE — ED Notes (Signed)
Dr Criss Alvine at bedside to start ultrasound PIV

## 2014-09-22 NOTE — ED Notes (Signed)
Patient states she has had 3-4 BM's with dark red blood in the toilet. Patient denies any abdominal pain, dizziness, weakness, or hemorrhoids.

## 2014-09-22 NOTE — ED Notes (Signed)
Unable to get the blood from patient

## 2014-09-22 NOTE — ED Notes (Signed)
HgB: 5.9   Notified primary nurse 

## 2014-09-22 NOTE — H&P (Signed)
PCP:  [name], MD    Referring provider Lawrence Marseilles   Chief Complaint:  Maroon stool   HPI: Meghan Russo is a 81 y.o. female   has a past medical history of Hypertension; Knee pain; Chronic kidney disease, stage III (moderate); Moderate aortic stenosis (12/2013); Moderate to severe pulmonary hypertension; Arthritis; Cataract; Bradycardia; Severe tricuspid regurgitation (12/2013); Moderate aortic insufficiency (12/2013); Mild mitral regurgitation (12/2013); Chronic diastolic CHF (congestive heart failure); and Anemia.   Presented with [facility] BM's since yesterday. No hx of melena prior to that. Denies any abdominal pain.  Patient never had a colonoscopy. She has hx of CKD, and anemia with baseline Hg 9 today it was down 5.9. Patient denies any chest pain no shortness of breath.  Patient wheelchair bound at baseline due to hx of arthritis. Patient is frail at baseline with extensive cardiac hx including pulmonary hypertension, diastolic heart failure, severe tricuspid regurgitation aortic insufficiency as well as aortic stenosis. Cardiac issues has been followed by Dr. Allyson Sabal   Hospitalist was called for admission for Gi bleed  Review of Systems:    Pertinent positives include:  blood in stool,  Constitutional:  No weight loss, night sweats, Fevers, chills, fatigue, weight loss  HEENT:  No headaches, Difficulty swallowing,Tooth/dental problems,Sore throat,  No sneezing, itching, ear ache, nasal congestion, post nasal drip,  Cardio-vascular:  No chest pain, Orthopnea, PND, anasarca, dizziness, palpitations.no Bilateral lower extremity swelling  GI:  No heartburn, indigestion, abdominal pain, nausea, vomiting, diarrhea, change in bowel habits, loss of appetite, melena,  hematemesis Resp:  no shortness of breath at rest. No dyspnea on exertion, No excess mucus, no productive cough, No non-productive cough, No coughing up of blood.No change in color of mucus.No wheezing. Skin:    no rash or lesions. No jaundice GU:  no dysuria, change in color of urine, no urgency or frequency. No straining to urinate.  No flank pain.  Musculoskeletal:  No joint pain or no joint swelling. No decreased range of motion. No back pain.  Psych:  No change in mood or affect. No depression or anxiety. No memory loss.  Neuro: no localizing neurological complaints, no tingling, no weakness, no double vision, no gait abnormality, no slurred speech, no confusion  Otherwise ROS are negative except for above, 10 systems were reviewed  Past Medical History: Past Medical History  Diagnosis Date  . Hypertension   . Knee pain   . Chronic kidney disease, stage III (moderate)   . Moderate aortic stenosis 12/2013  . Moderate to severe pulmonary hypertension   . Arthritis   . Cataract   . Bradycardia     a. 03/2014: HR 30's, sinus pauses and Wenkebach - BB discontinued.  . Severe tricuspid regurgitation 12/2013  . Moderate aortic insufficiency 12/2013  . Mild mitral regurgitation 12/2013  . Chronic diastolic CHF (congestive heart failure)   . Anemia    Past Surgical History  Procedure Laterality Date  . Vericose vein surgery    . Cataract extraction    . Inguinal lymph node biopsy Right 08/2011     Medications: Prior to Admission medications   Medication Sig Start Date End Date Taking? Authorizing Provider  acetaminophen (TYLENOL) 500 MG tablet Take 1,000 mg by mouth every 6 (six) hours as needed for moderate pain (pain).    Yes Historical Provider, MD  aspirin 81 MG tablet Take 81 mg by mouth daily.   Yes Historical Provider, MD  furosemide (LASIX) 80 MG tablet Take 1 tablet (80  mg total) by mouth 3 (three) times daily. 07/12/14  Yes Janetta Hora, PA-C  hydrALAZINE (APRESOLINE) 50 MG tablet TAKE 1 TABLET (50 MG TOTAL) BY MOUTH 3 (THREE) TIMES DAILY. 06/07/14  Yes Runell Gess, MD  isosorbide dinitrate (ISORDIL) 20 MG tablet Take 1 tablet (20 mg total) by mouth 3 (three) times  daily. 03/22/14  Yes Brittainy Sherlynn Carbon, PA-C  potassium chloride SA (K-DUR,KLOR-CON) 20 MEQ tablet Take 1 tablet (20 mEq total) by mouth daily. Patient taking differently: Take 20 mEq by mouth 2 (two) times daily.  07/19/14  Yes Rosalio Macadamia, NP    Allergies:   Allergies  Allergen Reactions  . Asa [Aspirin] Other (See Comments)    GI Upset    Social History:  Ambulatory  wheelchair bound,  Lives at home With family     reports that she has never smoked. She has never used smokeless tobacco. She reports that she does not drink alcohol or use illicit drugs.    Family History: family history includes Breast cancer in her daughter; CAD in her sister; Diabetes type II in her other; Heart failure in her daughter; Hypertension in her brother and another family member.    Physical Exam: Patient Vitals for the past 24 hrs:  BP Temp Temp src Pulse Resp SpO2  09/22/14 1830 - - - (!) 56 16 98 %  09/22/14 1800 (!) 138/40 mmHg - - - 16 -  09/22/14 1700 (!) 123/49 mmHg - - (!) 47 16 99 %  09/22/14 1601 (!) 116/47 mmHg 97.9 F (36.6 C) Oral 74 16 97 %    1. General:  in No Acute distress 2. Psychological: Alert and  Oriented 3. Head/ENT:   Moist  Mucous Membranes                          Head Non traumatic, neck supple                          Normal  Dentition 4. SKIN:  decreased Skin turgor,  Skin clean Dry and intact no rash 5. Heart: Regular rate and rhythm no Murmur, Rub or gallop 6. Lungs: Clear to auscultation bilaterally, no wheezes or crackles   7. Abdomen: Soft, non-tender, Non distended 8. Lower extremities: no clubbing, cyanosis, or edema 9. Neurologically Grossly intact, moving all 4 extremities equally 10. MSK: Normal range of motion Hemoccult stool positive  body mass index is unknown because there is no weight on file.   Labs on Admission:   Results for orders placed or performed during the hospital encounter of 09/22/14 (from the past 24 hour(s))  POC  occult blood, ED RN will collect     Status: Abnormal   Collection Time: 09/22/14  4:57 PM  Result Value Ref Range   Fecal Occult Bld POSITIVE (A) NEGATIVE  CBC     Status: Abnormal   Collection Time: 09/22/14  5:32 PM  Result Value Ref Range   WBC 7.0 4.0 - 10.5 K/uL   RBC 1.87 (L) 3.87 - 5.11 MIL/uL   Hemoglobin 5.9 (LL) 12.0 - 15.0 g/dL   HCT 83.1 (L) 51.7 - 61.6 %   MCV 96.3 78.0 - 100.0 fL   MCH 31.6 26.0 - 34.0 pg   MCHC 32.8 30.0 - 36.0 g/dL   RDW 07.3 (H) 71.0 - 62.6 %   Platelets 128 (L) 150 - 400 K/uL  Comprehensive  metabolic panel     Status: Abnormal   Collection Time: 09/22/14  5:32 PM  Result Value Ref Range   Sodium 142 135 - 145 mmol/L   Potassium 5.4 (H) 3.5 - 5.1 mmol/L   Chloride 115 (H) 101 - 111 mmol/L   CO2 18 (L) 22 - 32 mmol/L   Glucose, Bld 91 65 - 99 mg/dL   BUN 161 (H) 6 - 20 mg/dL   Creatinine, Ser 0.96 (H) 0.44 - 1.00 mg/dL   Calcium 8.5 (L) 8.9 - 10.3 mg/dL   Total Protein 7.1 6.5 - 8.1 g/dL   Albumin 3.1 (L) 3.5 - 5.0 g/dL   AST 14 (L) 15 - 41 U/L   ALT 9 (L) 14 - 54 U/L   Alkaline Phosphatase 101 38 - 126 U/L   Total Bilirubin 0.7 0.3 - 1.2 mg/dL   GFR calc non Af Amer 13 (L) >60 mL/min   GFR calc Af Amer 16 (L) >60 mL/min   Anion gap 9 5 - 15    UA not obtained  No results found for: HGBA1C  CrCl cannot be calculated (Unknown ideal weight.).  BNP (last 3 results)  Recent Labs  03/16/14 1400 03/18/14 0428 07/19/14 1153  PROBNP 19829.0* 20357.0* 768.0*    Other results:  I have pearsonaly reviewed this: ECG REPORT not obtained  There were no vitals filed for this visit.   Cultures:    Component Value Date/Time   SDES WOUND LEFT FOOT 05/13/2011 1510   SPECREQUEST NONE 05/13/2011 1510   CULT  05/13/2011 1510    ABUNDANT METHICILLIN RESISTANT STAPHYLOCOCCUS AUREUS Note: RIFAMPIN AND GENTAMICIN SHOULD NOT BE USED AS SINGLE DRUGS FOR TREATMENT OF STAPH INFECTIONS. This organism DOES NOT demonstrate inducible Clindamycin  resistance in vitro. CRITICAL RESULT CALLED TO, READ BACK BY AND VERIFIED WITH: JAN 05/16/11 0845  BY SMITHERSJ   REPTSTATUS 05/16/2011 FINAL 05/13/2011 1510     Radiological Exams on Admission: No results found.  Chart has been reviewed  Family   at  Bedside  plan of care was discussed with Forrest Moron [phone]   Assessment/Plan  81 year old female history of chronic diastolic heart failure, chronic kidney disease, history of AV block, pulmonary hypertension and aortic stenosis pulmonary presents with 24 hours of maroon stool per rectum Hemoccult-positive with a drop in hemoglobin from 9 to 5.9 being admitted for blood transfusion and GI workup  Present on Admission:  . GI bleeding - source unclear given history of being on baby aspirin will make sure she is on Protonix to cover for possible upper GI blood loss. Have discussed case with [facility] on-call will see patient tomorrow. For now admit to step down transfuse 2 units monitored closely CBC. Patient has extensive cardiac history and overall frail. Discussed with family that she may be a poor candidate for colonoscopy at her age will defer to GI if she could tolerate endoscopy  . Essential hypertension - labile, given recent blood loss and somewhat soft blood pressures will hold off on home blood pressure medications for tonight monitor blood pressure and the restart is able  . Anemia - likely secondary to GI blood loss although patient has underlying chronic anemia we'll transfuse 2 units  . Thrombocytopenia  -  patient has history of chronic thrombocytopenia cause unclear with baseline platelet count ranging from 120s to 170s . Moderate to severe pulmonary hypertension - avoid fluid shifts  . Atrioventricular block, Mobitz type 1, Wenckebach: now BB stopped. - No  beta blockers monitored on telemetry  . Chronic diastolic CHF (congestive heart failure) - currently appears to be fluid down. Given GI bleed. Hold off on Lasix for  tonight will likely need to be restarted as needed  . Chronic kidney disease, stage III (moderate) -creatinine at baseline     Prophylaxis: SCD    CODE STATUS:  FULL CODE as per patient    Disposition:  likely will need placement for rehabilitation family would be interested in placement   Other plan as per orders.  I have spent a total of 65 min on this admission extra time was taken to discuss case with [facility]   Aldean Suddeth 09/22/2014, 6:56 PM  [facility]  Pager [phone]   after 2 AM please page floor coverage PA If 7AM-7PM, please contact the day team taking care of the patient  [email or web address]  Password [email or web address]

## 2014-09-22 NOTE — Progress Notes (Signed)
EDCM spoke to patient and her daughters at bedside.  EDCM spoke to patient's family briefly as admitting doctor at bedside. Patient is sleeping.  Patient's daughter Britta Mccreedy [phone].  Patient's daughters report patient was recently at a facility and was discharged to home with home health services with [facility].  Patient requires assistance with ADL's and meals at home. Patient has a walker, wheelchair, rolator, cane and hospital bed at home.  Patient's daughter confirms patient's pcp is Dr. Dorothyann Peng.

## 2014-09-22 NOTE — ED Notes (Signed)
Dr. Doutova at bedside.  

## 2014-09-22 NOTE — ED Provider Notes (Signed)
CSN: [id number]     Arrival date & time 09/22/14  1551 History   First MD Initiated Contact with Patient 09/22/14 1636     Chief Complaint  Patient presents with  . Rectal Bleeding     (Consider location/radiation/quality/duration/timing/severity/associated sxs/prior Treatment) HPI  81 year old female presents with rectal bleeding over the past 48 hours. The patient has been having 2-3 dark bloody bowel movements per day. She denies rectal pain, abdominal pain, chest pain, or shortness of breath. No lightheadedness/dizziness. Patient is on a baby aspirin but no other blood thinners. Patient has never had rectal bleeding before. Daughter states there is some stool in there but is mostly a lot of dark red blood. Patient states she has never had a colonoscopy before.  Past Medical History  Diagnosis Date  . Hypertension   . Knee pain   . Chronic kidney disease, stage III (moderate)   . Moderate aortic stenosis 12/2013  . Moderate to severe pulmonary hypertension   . Arthritis   . Cataract   . Bradycardia     a. 03/2014: HR 30's, sinus pauses and Wenkebach - BB discontinued.  . Severe tricuspid regurgitation 12/2013  . Moderate aortic insufficiency 12/2013  . Mild mitral regurgitation 12/2013  . Chronic diastolic CHF (congestive heart failure)   . Anemia    Past Surgical History  Procedure Laterality Date  . Vericose vein surgery    . Cataract extraction    . Inguinal lymph node biopsy Right 08/2011   Family History  Problem Relation Age of Onset  . Hypertension     History  Substance Use Topics  . Smoking status: Never Smoker   . Smokeless tobacco: Never Used  . Alcohol Use: No   OB History    No data available     Review of Systems  Constitutional: Negative for fever and fatigue.  Respiratory: Negative for shortness of breath.   Cardiovascular: Negative for chest pain.  Gastrointestinal: Positive for blood in stool. Negative for nausea, vomiting, abdominal pain,  diarrhea and constipation.  Neurological: Negative for weakness.  All other systems reviewed and are negative.     Allergies  Asa  Home Medications   Prior to Admission medications   Medication Sig Start Date End Date Taking? Authorizing Provider  acetaminophen (TYLENOL) 500 MG tablet Take 500 mg by mouth every morning.   Yes Historical Provider, MD  aspirin 81 MG tablet Take 81 mg by mouth daily.   Yes Historical Provider, MD  furosemide (LASIX) 80 MG tablet Take 1 tablet (80 mg total) by mouth 3 (three) times daily. 07/12/14  Yes Janetta Hora, PA-C  hydrALAZINE (APRESOLINE) 50 MG tablet TAKE 1 TABLET (50 MG TOTAL) BY MOUTH 3 (THREE) TIMES DAILY. 06/07/14  Yes Runell Gess, MD  isosorbide dinitrate (ISORDIL) 20 MG tablet Take 1 tablet (20 mg total) by mouth 3 (three) times daily. 03/22/14  Yes Brittainy Sherlynn Carbon, PA-C  potassium chloride SA (K-DUR,KLOR-CON) 20 MEQ tablet Take 1 tablet (20 mEq total) by mouth daily. Patient taking differently: Take 20 mEq by mouth 2 (two) times daily.  07/19/14  Yes Rosalio Macadamia, NP  predniSONE (STERAPRED UNI-PAK 21 TAB) 5 MG (21) TBPK tablet See admin instructions. 09/03/14   Historical Provider, MD   BP 116/47 mmHg  Pulse 74  Temp(Src) 97.9 F (36.6 C) (Oral)  Resp 16  SpO2 97% Physical Exam  Constitutional: She is oriented to person, place, and time. She appears well-developed and well-nourished.  HENT:  Head: Normocephalic and atraumatic.  Right Ear: External ear normal.  Left Ear: External ear normal.  Nose: Nose normal.  Eyes: Right eye exhibits no discharge. Left eye exhibits no discharge.  Cardiovascular: Normal rate and regular rhythm.   Murmur heard. Pulmonary/Chest: Effort normal and breath sounds normal.  Abdominal: Soft. She exhibits no distension. There is no tenderness.  Genitourinary: Rectal exam shows no external hemorrhoid, no internal hemorrhoid, no mass and no tenderness. Guaiac positive stool (dark red blood on  exam).  Neurological: She is alert and oriented to person, place, and time.  Skin: Skin is warm and dry.  Nursing note and vitals reviewed.   ED Course  Procedures (including critical care time) Labs Review Labs Reviewed  MRSA PCR SCREENING - Abnormal; Notable for the following:    MRSA by PCR POSITIVE (*)    All other components within normal limits  CBC - Abnormal; Notable for the following:    RBC 1.87 (*)    Hemoglobin 5.9 (*)    HCT 18.0 (*)    RDW 16.5 (*)    Platelets 128 (*)    All other components within normal limits  COMPREHENSIVE METABOLIC PANEL - Abnormal; Notable for the following:    Potassium 5.4 (*)    Chloride 115 (*)    CO2 18 (*)    BUN 106 (*)    Creatinine, Ser 2.84 (*)    Calcium 8.5 (*)    Albumin 3.1 (*)    AST 14 (*)    ALT 9 (*)    GFR calc non Af Amer 13 (*)    GFR calc Af Amer 16 (*)    All other components within normal limits  PROTIME-INR - Abnormal; Notable for the following:    Prothrombin Time 16.7 (*)    All other components within normal limits  POC OCCULT BLOOD, ED - Abnormal; Notable for the following:    Fecal Occult Bld POSITIVE (*)    All other components within normal limits  MAGNESIUM  PHOSPHORUS  TSH  COMPREHENSIVE METABOLIC PANEL  CBC  CBC  TYPE AND SCREEN  PREPARE RBC (CROSSMATCH)  ABO/RH    Imaging Review No results found.   EKG Interpretation None      CRITICAL CARE Performed by: Pricilla Loveless T   Total critical care time: 30 minutes  Critical care time was exclusive of separately billable procedures and treating other patients.  Critical care was necessary to treat or prevent imminent or life-threatening deterioration.  Critical care was time spent personally by me on the following activities: development of treatment plan with patient and/or surrogate as well as nursing, discussions with consultants, evaluation of patient's response to treatment, examination of patient, obtaining history from  patient or surrogate, ordering and performing treatments and interventions, ordering and review of laboratory studies, ordering and review of radiographic studies, pulse oximetry and re-evaluation of patient's condition.  MDM   Final diagnoses:  Rectal bleeding    Patient appears to have significant anemia with her hemoglobin of 5.9. Does appear to have acute blood loss with dark red blood on rectal exam. Will be given 2 units of crossmatched blood and admitted for further GI workup. Admit to stepdown, Dr. Adela Glimpse admitting.    Pricilla Loveless, MD 09/22/14 [phone]

## 2014-09-23 ENCOUNTER — Encounter (HOSPITAL_COMMUNITY): Payer: Self-pay | Admitting: Physician Assistant

## 2014-09-23 DIAGNOSIS — D62 Acute posthemorrhagic anemia: Secondary | ICD-10-CM

## 2014-09-23 DIAGNOSIS — L8990 Pressure ulcer of unspecified site, unspecified stage: Secondary | ICD-10-CM | POA: Insufficient documentation

## 2014-09-23 DIAGNOSIS — K922 Gastrointestinal hemorrhage, unspecified: Secondary | ICD-10-CM

## 2014-09-23 DIAGNOSIS — I441 Atrioventricular block, second degree: Secondary | ICD-10-CM

## 2014-09-23 LAB — COMPREHENSIVE METABOLIC PANEL
ALT: 9 U/L — ABNORMAL LOW (ref 14–54)
ANION GAP: 11 (ref 5–15)
AST: 12 U/L — ABNORMAL LOW (ref 15–41)
Albumin: 2.8 g/dL — ABNORMAL LOW (ref 3.5–5.0)
Alkaline Phosphatase: 86 U/L (ref 38–126)
BUN: 107 mg/dL — AB (ref 6–20)
CALCIUM: 8.7 mg/dL — AB (ref 8.9–10.3)
CO2: 19 mmol/L — AB (ref 22–32)
CREATININE: 2.8 mg/dL — AB (ref 0.44–1.00)
Chloride: 115 mmol/L — ABNORMAL HIGH (ref 101–111)
GFR calc Af Amer: 16 mL/min — ABNORMAL LOW (ref >60)
GFR, EST NON AFRICAN AMERICAN: 14 mL/min — AB (ref >60)
GLUCOSE: 79 mg/dL (ref 65–99)
Potassium: 5.2 mmol/L — ABNORMAL HIGH (ref 3.5–5.1)
Sodium: 145 mmol/L (ref 135–145)
Total Bilirubin: 1.1 mg/dL (ref 0.3–1.2)
Total Protein: 6.5 g/dL (ref 6.5–8.1)

## 2014-09-23 LAB — CBC
HCT: 24 % — ABNORMAL LOW (ref 36.0–46.0)
HEMOGLOBIN: 8 g/dL — AB (ref 12.0–15.0)
MCH: 30.8 pg (ref 26.0–34.0)
MCHC: 33.3 g/dL (ref 30.0–36.0)
MCV: 92.3 fL (ref 78.0–100.0)
PLATELETS: 113 10*3/uL — AB (ref 150–400)
RBC: 2.6 MIL/uL — ABNORMAL LOW (ref 3.87–5.11)
RDW: 16.4 % — ABNORMAL HIGH (ref 11.5–15.5)
WBC: 5.3 10*3/uL (ref 4.0–10.5)

## 2014-09-23 LAB — URINE MICROSCOPIC-ADD ON

## 2014-09-23 LAB — URINALYSIS, ROUTINE W REFLEX MICROSCOPIC
Bilirubin Urine: NEGATIVE
GLUCOSE, UA: NEGATIVE mg/dL
HGB URINE DIPSTICK: NEGATIVE
KETONES UR: NEGATIVE mg/dL
Nitrite: NEGATIVE
PROTEIN: 100 mg/dL — AB
Specific Gravity, Urine: 1.015 (ref 1.005–1.030)
Urobilinogen, UA: 0.2 mg/dL (ref 0.0–1.0)
pH: 8.5 — ABNORMAL HIGH (ref 5.0–8.0)

## 2014-09-23 LAB — TSH: TSH: 1.771 u[IU]/mL (ref 0.350–4.500)

## 2014-09-23 LAB — MAGNESIUM: MAGNESIUM: 2.2 mg/dL (ref 1.7–2.4)

## 2014-09-23 LAB — PHOSPHORUS: PHOSPHORUS: 4.5 mg/dL (ref 2.5–4.6)

## 2014-09-23 MED ORDER — PANTOPRAZOLE SODIUM 40 MG IV SOLR
40.0000 mg | Freq: Two times a day (BID) | INTRAVENOUS | Status: DC
  Administered 2014-09-23 (×2): 40 mg via INTRAVENOUS
  Filled 2014-09-23 (×2): qty 40

## 2014-09-23 MED ORDER — CHLORHEXIDINE GLUCONATE CLOTH 2 % EX PADS
6.0000 | MEDICATED_PAD | Freq: Every day | CUTANEOUS | Status: DC
  Administered 2014-09-24 – 2014-09-27 (×4): 6 via TOPICAL

## 2014-09-23 MED ORDER — FUROSEMIDE 40 MG PO TABS
80.0000 mg | ORAL_TABLET | Freq: Two times a day (BID) | ORAL | Status: DC
  Administered 2014-09-23 – 2014-09-27 (×9): 80 mg via ORAL
  Filled 2014-09-23 (×9): qty 2

## 2014-09-23 MED ORDER — MUPIROCIN 2 % EX OINT
1.0000 "application " | TOPICAL_OINTMENT | Freq: Two times a day (BID) | CUTANEOUS | Status: AC
  Administered 2014-09-23 – 2014-09-27 (×10): 1 via NASAL
  Filled 2014-09-23: qty 22

## 2014-09-23 NOTE — Consult Note (Signed)
Consultation  Referring Provider:   [facility]  Primary Care Physician:  [name], MD Primary Gastroenterologist: unassigned         Reason for Consultation:  GI bleed          HPI:   Meghan Russo is a 81 y.o. female with multiple medical problems who presented to ED yesterday with painless hematochezia and hgb of 5.9. She has chronic anemia. Baseline hgb 9-10 until March of this year when it was 8.1. In mid April hgb was 8.5. She has gotten two units of blood but follow up hgb not available. She is unable to provide significant hx. Is not complaining of pain. Has not vomited.  Past Medical History  Diagnosis Date  . Hypertension   . Knee pain   . Chronic kidney disease, stage III (moderate)   . Moderate aortic stenosis 12/2013  . Moderate to severe pulmonary hypertension   . Arthritis   . Cataract   . Bradycardia     a. 03/2014: HR 30's, sinus pauses and Wenkebach - BB discontinued.  . Severe tricuspid regurgitation 12/2013  . Moderate aortic insufficiency 12/2013  . Mild mitral regurgitation 12/2013  . Chronic diastolic CHF (congestive heart failure)   . Anemia     Past Surgical History  Procedure Laterality Date  . Vericose vein surgery    . Cataract extraction    . Inguinal lymph node biopsy Right 08/2011    Family History  Problem Relation Age of Onset  . Hypertension    . CAD Sister   . Hypertension Brother   . Diabetes type II Other   . Heart failure Daughter   . Breast cancer Daughter      History  Substance Use Topics  . Smoking status: Never Smoker   . Smokeless tobacco: Never Used  . Alcohol Use: No    Prior to Admission medications   Medication Sig Start Date End Date Taking? Authorizing Provider  acetaminophen (TYLENOL) 500 MG tablet Take 1,000 mg by mouth every 6 (six) hours as needed for moderate pain (pain).    Yes Historical Provider, MD  aspirin 81 MG tablet Take 81 mg by mouth daily.   Yes Historical Provider, MD    furosemide (LASIX) 80 MG tablet Take 1 tablet (80 mg total) by mouth 3 (three) times daily. 07/12/14  Yes Janetta Hora, PA-C  hydrALAZINE (APRESOLINE) 50 MG tablet TAKE 1 TABLET (50 MG TOTAL) BY MOUTH 3 (THREE) TIMES DAILY. 06/07/14  Yes Runell Gess, MD  isosorbide dinitrate (ISORDIL) 20 MG tablet Take 1 tablet (20 mg total) by mouth 3 (three) times daily. 03/22/14  Yes Brittainy Sherlynn Carbon, PA-C  potassium chloride SA (K-DUR,KLOR-CON) 20 MEQ tablet Take 1 tablet (20 mEq total) by mouth daily. Patient taking differently: Take 20 mEq by mouth 2 (two) times daily.  07/19/14  Yes Rosalio Macadamia, NP    Current Facility-Administered Medications  Medication Dose Route Frequency Provider Last Rate Last Dose  . acetaminophen (TYLENOL) tablet 650 mg  650 mg Oral Q6H PRN Therisa Doyne, MD       Or  . acetaminophen (TYLENOL) suppository 650 mg  650 mg Rectal Q6H PRN Therisa Doyne, MD      . Chlorhexidine Gluconate Cloth 2 % PADS 6 each  6 each Topical Q0600 Therisa Doyne, MD      . furosemide (LASIX) tablet 80 mg  80 mg Oral BID Zannie Cove, MD   80 mg  at 09/23/14 0925  . HYDROcodone-acetaminophen (NORCO/VICODIN) 5-325 MG per tablet 1-2 tablet  1-2 tablet Oral Q4H PRN Therisa Doyne, MD      . mupirocin ointment (BACTROBAN) 2 % 1 application  1 application Nasal BID Therisa Doyne, MD   1 application at 09/23/14 0926  . ondansetron (ZOFRAN) tablet 4 mg  4 mg Oral Q6H PRN Therisa Doyne, MD       Or  . ondansetron (ZOFRAN) injection 4 mg  4 mg Intravenous Q6H PRN Therisa Doyne, MD      . pantoprazole (PROTONIX) injection 40 mg  40 mg Intravenous Q12H Zannie Cove, MD   40 mg at 09/23/14 0925  . sodium chloride 0.9 % injection 3 mL  3 mL Intravenous Q12H Therisa Doyne, MD   3 mL at 09/23/14 0926    Allergies as of 09/22/2014 - Review Complete 09/22/2014  Allergen Reaction Noted  . Asa [aspirin] Other (See Comments) 08/01/2011    Review of Systems:     As per HPI, otherwise negative   Physical Exam:  Vital signs in last 24 hours: Temp:  [97.4 F (36.3 C)-98 F (36.7 C)] 97.8 F (36.6 C) (06/23 0800) Pulse Rate:  [47-74] 58 (06/23 0700) Resp:  [12-20] 12 (06/23 0700) BP: (106-143)/(22-82) 142/27 mmHg (06/23 0700) SpO2:  [96 %-100 %] 97 % (06/23 0700) Weight:  [175 lb 4.3 oz (79.5 kg)] 175 lb 4.3 oz (79.5 kg) (06/22 2000) Last BM Date: 09/22/14 General:   Pleasant black female in NAD. Two daughters in room.  Head:  Normocephalic and atraumatic. Eyes:   No icterus.   Conjunctiva pink. Ears:  Normal auditory acuity. Neck:  Supple Lungs:  Respirations even and unlabored. Lungs clear to auscultation bilaterally.   No wheezes, crackles, or rhonchi.  Heart:  Regular rate and rhythm Abdomen:  Soft, abdominal wall edema, especially in RUQ and RLQ. Hypoactive bowel sounds. no appreciable masses.  Msk:  Symmetrical without gross deformities.  Extremities:  1-2 + BLE edema.  Neurologic:  Alert and  oriented x4 Skin:  Intact without significant lesions or rashes.Thick, discolored BLE skin Psych:  Alert and cooperative.   LAB RESULTS:  Recent Labs  09/22/14 1732 09/23/14 0510  WBC 7.0 5.3  HGB 5.9* 8.0*  HCT 18.0* 24.0*  PLT 128* 113*   BMET  Recent Labs  09/22/14 1732 09/23/14 0510  NA 142 145  K 5.4* 5.2*  CL 115* 115*  CO2 18* 19*  GLUCOSE 91 79  BUN 106* 107*  CREATININE 2.84* 2.80*  CALCIUM 8.5* 8.7*   LFT  Recent Labs  09/23/14 0510  PROT 6.5  ALBUMIN 2.8*  AST 12*  ALT 9*  ALKPHOS 86  BILITOT 1.1   PT/INR  Recent Labs  09/22/14 1830  LABPROT 16.7*  INR 1.34   PREVIOUS ENDOSCOPIES:            none   Impression / Plan:    81 year old female with multiple medical problems presenting with maroon stools and severe normocytic anemia. BUN markedly elevated at 107, seems out of proportion to creatinine of 2.8 but this is typical pattern for her. This could be diverticular bleed, colon neoplasm  possible. Upper bleed not excluded but seems unlikely. Spoke with daughters. Patient is high risk for procedures.  No further bleeding today per RN. Plan is to continue supportive care and avoid endoscopic workup given advanced age and co-morbidities.   Thanks   LOS: 1 day   Willette Cluster  09/23/2014,  10:30 AM     [facility] Attending  I have also seen and assessed the patient and agree with the advanced practitioner's assessment and plan. Hx and PE same.  Iva Boop, MD, [facility] [phone] (pager) 09/23/2014 11:21 AM

## 2014-09-23 NOTE — Progress Notes (Signed)
Patient had two incontinent urine episodes overnight. Patients urine is extremely foul smelling, but a sample was unable to be obtained due to patients incontinence. Will pass this information to day shift and speak to MD about the possibility of a urine culture. Bosie Helper, RN

## 2014-09-23 NOTE — Progress Notes (Addendum)
[facility] PROGRESS NOTE  Meghan Russo ZOX:[id number] DOB: Jan 12, 1921 DOA: 09/22/2014 PCP: [name], MD  Assessment/Plan:  1. BRBPR   -painless, never had a colonoscopy -GI consulted -s/p 2units PRBC -Stop protonix gtt -ideally would favor conservative management due to age and comorbidities -CBC in am  2. Chronic diatsolic CHF -compensated, resume lasix  BID, takes TID at home  3. Valvular heart disease  -moderate AS/AI /MR and severe TR with pulmonary hypertension -conservative therapy per cards  4. AKi on CKD 4 -creatinine slightly higher than baseline (2.3), monitor -s/p transfusion, watch for uremia symptoms -monitor for now  5. HTN -stable, hydralazine and imdur on hold due to GI bleed -resume in next 24-48h  DVT proph: SCDs  Code Status: Full Code Family Communication: none at bedside Disposition Plan: keep in SDU today   Consultants:  GI  HPI/Subjective: Feels ok, no further bleeding  Objective: Filed Vitals:   09/23/14 0700  BP: 142/27  Pulse: 58  Temp:   Resp: 12    Intake/Output Summary (Last 24 hours) at 09/23/14 0732 Last data filed at 09/23/14 0600  Gross per 24 hour  Intake 1014.17 ml  Output      0 ml  Net 1014.17 ml   Filed Weights   09/22/14 2000  Weight: 79.5 kg (175 lb 4.3 oz)    Exam:   General:  AAOx3, no distress  Cardiovascular: S1S2/RRR, systolic murmur  Respiratory: decreased BS at bases  Abdomen: soft, NT, BS present  Musculoskeletal: trace edema   Data Reviewed: Basic Metabolic Panel:  Recent Labs Lab 09/22/14 1732 09/23/14 0510  NA 142 145  K 5.4* 5.2*  CL 115* 115*  CO2 18* 19*  GLUCOSE 91 79  BUN 106* 107*  CREATININE 2.84* 2.80*  CALCIUM 8.5* 8.7*  MG  --  2.2  PHOS  --  4.5   Liver Function Tests:  Recent Labs Lab 09/22/14 1732 09/23/14 0510  AST 14* 12*  ALT 9* 9*  ALKPHOS 101 86  BILITOT 0.7 1.1  PROT 7.1 6.5  ALBUMIN 3.1* 2.8*   No results for input(s):  LIPASE, AMYLASE in the last 168 hours. No results for input(s): AMMONIA in the last 168 hours. CBC:  Recent Labs Lab 09/22/14 1732 09/23/14 0510  WBC 7.0 5.3  HGB 5.9* 8.0*  HCT 18.0* 24.0*  MCV 96.3 92.3  PLT 128* 113*   Cardiac Enzymes: No results for input(s): CKTOTAL, CKMB, CKMBINDEX, TROPONINI in the last 168 hours. BNP (last 3 results)  Recent Labs  06/28/14 1840  BNP 1593.3*    ProBNP (last 3 results)  Recent Labs  03/16/14 1400 03/18/14 0428 07/19/14 1153  PROBNP 19829.0* 20357.0* 768.0*    CBG: No results for input(s): GLUCAP in the last 168 hours.  Recent Results (from the past 240 hour(s))  MRSA PCR Screening     Status: Abnormal   Collection Time: 09/22/14  8:25 PM  Result Value Ref Range Status   MRSA by PCR POSITIVE (A) NEGATIVE Final    Comment:        The GeneXpert MRSA Assay (FDA approved for NASAL specimens only), is one component of a comprehensive MRSA colonization surveillance program. It is not intended to diagnose MRSA infection nor to guide or monitor treatment for MRSA infections. RESULT CALLED TO, READ BACK BY AND VERIFIED WITH: SARA BETH EARLY,RN [id number] @ 2240 BY J SCOTTON      Studies: No results found.  Scheduled Meds: . Chlorhexidine Gluconate Cloth  6 each Topical Q0600  . furosemide  80 mg Oral BID  . mupirocin ointment  1 application Nasal BID  . pantoprazole (PROTONIX) IV  40 mg Intravenous Q12H  . sodium chloride  3 mL Intravenous Q12H   Continuous Infusions:  Antibiotics Given (last 72 hours)    None      Active Problems:   Essential hypertension - labile   Anemia   Thrombocytopenia   Moderate to severe pulmonary hypertension   Atrioventricular block, Mobitz type 1, Wenckebach: now BB stopped.   Chronic diastolic CHF (congestive heart failure)   Chronic kidney disease, stage III (moderate)   Rectal bleeding   GI bleeding    Time spent:    [facility]  [facility] Pager  [phone]. If 7PM-7AM, please contact night-coverage at [email or web address], password [facility] 09/23/2014, 7:32 AM  LOS: 1 day

## 2014-09-24 DIAGNOSIS — K922 Gastrointestinal hemorrhage, unspecified: Secondary | ICD-10-CM | POA: Insufficient documentation

## 2014-09-24 DIAGNOSIS — D62 Acute posthemorrhagic anemia: Secondary | ICD-10-CM | POA: Diagnosis present

## 2014-09-24 DIAGNOSIS — K625 Hemorrhage of anus and rectum: Secondary | ICD-10-CM

## 2014-09-24 LAB — TYPE AND SCREEN
ABO/RH(D): O POS
Antibody Screen: NEGATIVE
UNIT DIVISION: 0
UNIT DIVISION: 0

## 2014-09-24 LAB — CBC
HCT: 25 % — ABNORMAL LOW (ref 36.0–46.0)
Hemoglobin: 8.1 g/dL — ABNORMAL LOW (ref 12.0–15.0)
MCH: 30.3 pg (ref 26.0–34.0)
MCHC: 32.4 g/dL (ref 30.0–36.0)
MCV: 93.6 fL (ref 78.0–100.0)
Platelets: 124 10*3/uL — ABNORMAL LOW (ref 150–400)
RBC: 2.67 MIL/uL — AB (ref 3.87–5.11)
RDW: 16.9 % — ABNORMAL HIGH (ref 11.5–15.5)
WBC: 7.1 10*3/uL (ref 4.0–10.5)

## 2014-09-24 LAB — BASIC METABOLIC PANEL
Anion gap: 11 (ref 5–15)
BUN: 100 mg/dL — AB (ref 6–20)
CHLORIDE: 115 mmol/L — AB (ref 101–111)
CO2: 18 mmol/L — AB (ref 22–32)
Calcium: 8.6 mg/dL — ABNORMAL LOW (ref 8.9–10.3)
Creatinine, Ser: 2.64 mg/dL — ABNORMAL HIGH (ref 0.44–1.00)
GFR, EST AFRICAN AMERICAN: 17 mL/min — AB (ref >60)
GFR, EST NON AFRICAN AMERICAN: 15 mL/min — AB (ref >60)
GLUCOSE: 73 mg/dL (ref 65–99)
POTASSIUM: 4.7 mmol/L (ref 3.5–5.1)
SODIUM: 144 mmol/L (ref 135–145)

## 2014-09-24 MED ORDER — PANTOPRAZOLE SODIUM 40 MG PO TBEC
40.0000 mg | DELAYED_RELEASE_TABLET | Freq: Every day | ORAL | Status: DC
  Administered 2014-09-24 – 2014-09-26 (×3): 40 mg via ORAL
  Filled 2014-09-24 (×5): qty 1

## 2014-09-24 NOTE — Progress Notes (Addendum)
[facility] PROGRESS NOTE  Meghan Russo IRC:[id number] DOB: 25-Jan-1921 DOA: 09/22/2014 PCP: [name], MD  Assessment/Plan:   Painless Lower GI bleed -never had a colonoscopy, could be diverticular vs hemorrhoidal vs neoplasm -GI consulted, opted for conservative management, no further bleeding -s/p 2units PRBC 6/22 -change PPI to PO -Hb stable -advance diet  Chronic diatsolic CHF -compensated, continue lasix 80mg  BID, takes TID at home -volume status appears even at this time  Valvular heart disease  -moderate AS/AI /MR and severe TR with pulmonary hypertension -conservative therapy per cards  AKi on CKD 4 -creatinine slightly higher than baseline (2.3) -s/p transfusion, now improving -continue lasix  -followed by [facility]  HTN -stable, hydralazine and imdur on hold  -resume in next 24-48h  DVT proph: SCDs  Code Status: Full Code Family Communication: none at bedside, d/w daughter 6/23 Disposition Plan: Tx to tele, PT, OT, OOB   Consultants:  GI  HPI/Subjective: Feels ok, no further bleeding, no dyspnea  Objective: Filed Vitals:   09/24/14 0700  BP:   Pulse: 62  Temp:   Resp: 13    Intake/Output Summary (Last 24 hours) at 09/24/14 0754 Last data filed at 09/24/14 0600  Gross per 24 hour  Intake    160 ml  Output   2051 ml  Net  -1891 ml   Filed Weights   09/22/14 2000  Weight: 79.5 kg (175 lb 4.3 oz)    Exam:   General:  AAOx3, no distress  Cardiovascular: S1S2/RRR, systolic murmur  Respiratory: decreased BS at bases  Abdomen: soft, NT, BS present  Musculoskeletal: trace edema   Data Reviewed: Basic Metabolic Panel:  Recent Labs Lab 09/22/14 1732 09/23/14 0510 09/24/14 0330  NA 142 145 144  K 5.4* 5.2* 4.7  CL 115* 115* 115*  CO2 18* 19* 18*  GLUCOSE 91 79 73  BUN 106* 107* 100*  CREATININE 2.84* 2.80* 2.64*  CALCIUM 8.5* 8.7* 8.6*  MG  --  2.2  --   PHOS  --  4.5  --    Liver Function  Tests:  Recent Labs Lab 09/22/14 1732 09/23/14 0510  AST 14* 12*  ALT 9* 9*  ALKPHOS 101 86  BILITOT 0.7 1.1  PROT 7.1 6.5  ALBUMIN 3.1* 2.8*   No results for input(s): LIPASE, AMYLASE in the last 168 hours. No results for input(s): AMMONIA in the last 168 hours. CBC:  Recent Labs Lab 09/22/14 1732 09/23/14 0510 09/24/14 0330  WBC 7.0 5.3 7.1  HGB 5.9* 8.0* 8.1*  HCT 18.0* 24.0* 25.0*  MCV 96.3 92.3 93.6  PLT 128* 113* 124*   Cardiac Enzymes: No results for input(s): CKTOTAL, CKMB, CKMBINDEX, TROPONINI in the last 168 hours. BNP (last 3 results)  Recent Labs  06/28/14 1840  BNP 1593.3*    ProBNP (last 3 results)  Recent Labs  03/16/14 1400 03/18/14 0428 07/19/14 1153  PROBNP 19829.0* 20357.0* 768.0*    CBG: No results for input(s): GLUCAP in the last 168 hours.  Recent Results (from the past 240 hour(s))  MRSA PCR Screening     Status: Abnormal   Collection Time: 09/22/14  8:25 PM  Result Value Ref Range Status   MRSA by PCR POSITIVE (A) NEGATIVE Final    Comment:        The GeneXpert MRSA Assay (FDA approved for NASAL specimens only), is one component of a comprehensive MRSA colonization surveillance program. It is not intended to diagnose MRSA infection nor to guide or monitor  treatment for MRSA infections. RESULT CALLED TO, READ BACK BY AND VERIFIED WITH: SARA BETH EARLY,RN [id number] @ 2240 BY J SCOTTON      Studies: No results found.  Scheduled Meds: . Chlorhexidine Gluconate Cloth  6 each Topical Q0600  . furosemide  80 mg Oral BID  . mupirocin ointment  1 application Nasal BID  . pantoprazole  40 mg Oral Q1200  . sodium chloride  3 mL Intravenous Q12H   Continuous Infusions:  Antibiotics Given (last 72 hours)    None      Active Problems:   Essential hypertension - labile   Anemia   Thrombocytopenia   Moderate to severe pulmonary hypertension   Atrioventricular block, Mobitz type 1, Wenckebach: now BB stopped.   Chronic  diastolic CHF (congestive heart failure)   Chronic kidney disease, stage III (moderate)   Rectal bleeding   GI bleeding   Pressure ulcer    Time spent:    [facility]  [facility] Pager [phone]. If 7PM-7AM, please contact night-coverage at [email or web address], password [facility] 09/24/2014, 7:54 AM  LOS: 2 days

## 2014-09-24 NOTE — Clinical Documentation Improvement (Signed)
H&P notes history of chronic anemia with baseline hgb 9; hgb at admission 5.9 and stated likely secondary to GI blood loss.   Transfusion ordered.  Please document the acuity of the blood loss and anemia in your progress notes and carry over to the discharge summary.  Possible Clinical Conditions: -Acute blood loss anemia on chronic anemia -Other condition (please specify) -Unable to determine at present  Thank you, Doy Mince, RN [phone] Clinical Documentation Specialist

## 2014-09-24 NOTE — Evaluation (Signed)
Physical Therapy Evaluation Patient Details Name: Meghan Russo MRN: [id number] DOB: Apr 09, 1920 Today's Date: 09/24/2014   History of Present Illness  81 yo female admitted 6/22 with melanic stools. Conservative treatment of GI Bleed. Pt. was admitted 06/28/14 from MD office for dyspnea, volume overload.due to acute on chronic CHF,acute on chronic renal insufficiency and low diastolic pressures.  Pt. with h/o labilie HTN, valvular heart diaease, EF 55-60%, severe LVH, aortic stenosis,bradycardia.    Clinical Impression  Patient pleasant, family very supportive. Patient will benefit from PT to address problems listed in note below.    Follow Up Recommendations SNF;Supervision/Assistance - 24 hour    Equipment Recommendations  None recommended by PT    Recommendations for Other Services       Precautions / Restrictions Precautions Precautions: Fall      Mobility  Bed Mobility Overal bed mobility: Needs Assistance Bed Mobility: Supine to Sit     Supine to sit: Mod assist;HOB elevated     General bed mobility comments: use of bed pad to assist scoot to edge, patient did use upper body to self assist to scoot, extra time required.  Transfers Overall transfer level: Needs assistance Equipment used: Rolling walker (2 wheeled) Transfers: Sit to/from [employer] Sit to Stand: +2 safety/equipment;+2 physical assistance;Mod assist Stand pivot transfers: +2 safety/equipment;+2 physical assistance;Min assist       General transfer comment: lifting  assistance to stand from bed, some difficulty flexing knees to get feet back far enough under patient to facilitate standing.Patient able to pivot to  recliner with slow short shuffle steps using RW.  Ambulation/Gait                Stairs            Wheelchair Mobility    Modified Rankin (Stroke Patients Only)       Balance Overall balance assessment: Needs assistance Sitting-balance support:  Bilateral upper extremity supported;Feet supported Sitting balance-Leahy Scale: Fair     Standing balance support: Bilateral upper extremity supported;During functional activity Standing balance-Leahy Scale: Fair                               Pertinent Vitals/Pain Pain Assessment: No/denies pain    Home Living Family/patient expects to be discharged to:: Private residence Living Arrangements: Children;Other relatives Available Help at Discharge: Family;Available 24 hours/day Type of Home: House Home Access: Level entry     Home Layout: One level Home Equipment: Walker - standard;Cane - single point;Bedside commode;Walker - 2 wheels      Prior Function Level of Independence: Needs assistance   Gait / Transfers Assistance Needed: walks about 30' at home with RW with assist           Hand Dominance        Extremity/Trunk Assessment   Upper Extremity Assessment: Generalized weakness           Lower Extremity Assessment: RLE deficits/detail;LLE deficits/detail RLE Deficits / Details: decreased knee flexion, able to bear weight for standing LLE Deficits / Details: same as R  Cervical / Trunk Assessment: Normal  Communication      Cognition Arousal/Alertness: Awake/alert Behavior During Therapy: WFL for tasks assessed/performed Overall Cognitive Status: Within Functional Limits for tasks assessed                      General Comments      Exercises  Assessment/Plan    PT Assessment Patient needs continued PT services  PT Diagnosis Difficulty walking;Generalized weakness   PT Problem List Decreased strength;Decreased range of motion;Decreased activity tolerance  PT Treatment Interventions DME instruction;Gait training;Functional mobility training;Therapeutic exercise;Therapeutic activities;Patient/family education   PT Goals (Current goals can be found in the Care Plan section) Acute Rehab PT Goals Patient Stated Goal: agreed  to  getting OOB PT Goal Formulation: With patient/family Time For Goal Achievement: 10/08/14 Potential to Achieve Goals: Good    Frequency Min 3X/week   Barriers to discharge        Co-evaluation               End of Session Equipment Utilized During Treatment: Gait belt Activity Tolerance: Patient tolerated treatment well Patient left: in chair;with call bell/phone within reach;with family/visitor present Nurse Communication: Mobility status         Time: 1215-1247 PT Time Calculation (min) (ACUTE ONLY): 32 min   Charges:   PT Evaluation $Initial PT Evaluation Tier I: 1 Procedure PT Treatments $Therapeutic Activity: 8-22 mins   PT G Codes:        Rada Hay 09/24/2014, 1:31 PM Blanchard Kelch PT [phone]

## 2014-09-24 NOTE — Progress Notes (Signed)
    Progress Note   Subjective  feels okay this am. No further bleeding   Objective   Vital signs in last 24 hours: Temp:  [97.8 F (36.6 C)-98.4 F (36.9 C)] 98.4 F (36.9 C) (06/24 0313) Pulse Rate:  [52-74] 62 (06/24 0700) Resp:  [10-29] 13 (06/24 0700) BP: (115-157)/(22-37) 132/22 mmHg (06/24 0400) SpO2:  [96 %-100 %] 98 % (06/24 0700) Last BM Date: 09/22/14 General:    Pleasant black female in NAD Heart:  irregular rhythm, murmur present Abdomen:  Soft, nontender and nondistended. Pockets of abdominal wall edema. Normal bowel sounds. Neurologic:  Alert and oriented,  grossly normal neurologically. Psych:  Cooperative. Normal mood and affect.  Lab Results:  Recent Labs  09/22/14 1732 09/23/14 0510 09/24/14 0330  WBC 7.0 5.3 7.1  HGB 5.9* 8.0* 8.1*  HCT 18.0* 24.0* 25.0*  PLT 128* 113* 124*   BMET  Recent Labs  09/22/14 1732 09/23/14 0510 09/24/14 0330  NA 142 145 144  K 5.4* 5.2* 4.7  CL 115* 115* 115*  CO2 18* 19* 18*  GLUCOSE 91 79 73  BUN 106* 107* 100*  CREATININE 2.84* 2.80* 2.64*  CALCIUM 8.5* 8.7* 8.6*   LFT  Recent Labs  09/23/14 0510  PROT 6.5  ALBUMIN 2.8*  AST 12*  ALT 9*  ALKPHOS 86  BILITOT 1.1     Assessment / Plan:   81 year old female with multiple medical problems presenting with maroon stools and severe normocytic anemia.  This could be diverticular bleed, colon neoplasm possible. Upper bleed not excluded but seems unlikely. Spoke with daughters yesterday. Patient is high risk for procedures. Bleeding has stopped and hgb rose from 5.9 to 8 after transfusion and has remained stable overnight.  Plan is to continue supportive care and avoid endoscopic workup given advanced age and co-morbidities.    LOS: 2 days   Willette Cluster  09/24/2014, 8:39 AM   She has an anemia of acute blood loss and a chronic anemia.  Agree with above - I have seen the patient also.  Would move to floor and dc after 48 hrs no bleeding.  Iva Boop, MD, [facility] [phone] (pager) 09/24/2014 2:35 PM

## 2014-09-24 NOTE — Clinical Social Work Placement (Signed)
   CLINICAL SOCIAL WORK PLACEMENT  NOTE  Date:  09/24/2014  Patient Details  Name: Meghan Russo MRN: [id number] Date of Birth: Oct 02, 1920  Clinical Social Work is seeking post-discharge placement for this patient at the Skilled  Nursing Facility level of care (*CSW will initial, date and re-position this form in  chart as items are completed):  Yes   Patient/family provided with [place] Clinical Social Work Department's list of facilities offering this level of care within the geographic area requested by the patient (or if unable, by the patient's family).  Yes   Patient/family informed of their freedom to choose among providers that offer the needed level of care, that participate in Medicare, Medicaid or managed care program needed by the patient, have an available bed and are willing to accept the patient.  Yes   Patient/family informed of [place]'s ownership interest in [facility] and [facility], as well as of the fact that they are under no obligation to receive care at these facilities.  PASRR submitted to EDS on       PASRR number received on       Existing PASRR number confirmed on 09/24/14     FL2 transmitted to all facilities in geographic area requested by pt/family on 09/24/14     FL2 transmitted to all facilities within larger geographic area on       Patient informed that his/her managed care company has contracts with or will negotiate with certain facilities, including the following:            Patient/family informed of bed offers received.  Patient chooses bed at       Physician recommends and patient chooses bed at      Patient to be transferred to   on  .  Patient to be transferred to facility by       Patient family notified on   of transfer.  Name of family member notified:        PHYSICIAN       Additional Comment:    _______________________________________________ Arlyss Repress, LCSW 09/24/2014, 4:08 PM

## 2014-09-24 NOTE — Evaluation (Signed)
Occupational Therapy Evaluation Patient Details Name: Meghan Russo MRN: [id number] DOB: [birth year] Today's Date: 09/24/2014    History of Present Illness 81 yo female admitted 6/22 with melanic stools. Conservative treatment of GI BleedPt. was admitted 06/28/14 from MD office for dyspnea, volume overload.due to acute on chronic CHF.  Pt. now with acute on chronic renal insufficiency and low diastolic pressures.  Pt. with h/o labilie HTN, valvular heart diaease, EF 55-60%, severe LVH, aortic stenosis,bradycardia.     Clinical Impression   Pt was admitted for the above. At baseline, family assisted with LB adls and transfers to commode.  Pt currently needs max A x 2 to stand for ADLs. She will benefit from skilled OT in acute as well as follow up OT after her stay here.  Goals are for min A for sit to stand and min A x 2 for toilet transfers.  Will also address bil UE strengthening    Follow Up Recommendations  SNF    Equipment Recommendations  3 in 1 bedside comode    Recommendations for Other Services       Precautions / Restrictions Precautions Precautions: Fall Restrictions Weight Bearing Restrictions: No      Mobility Bed Mobility            General bed mobility comments: NT  Transfers Overall transfer level: Needs assistance Equipment used: Rolling walker (2 wheeled) Transfers: Sit to/from Stand Sit to Stand: Max assist;+2 physical assistance;+2 safety/equipment        General transfer comment: assist to power up, stabilize and control descent.  Pt had posterior lean initially    Balance Overall balance assessment: Needs assistance Sitting-balance support: Bilateral upper extremity supported;Feet supported Sitting balance-Leahy Scale: Fair     Standing balance support: Bilateral upper extremity supported Standing balance-Leahy Scale: Poor                              ADL Overall ADL's : Needs assistance/impaired     Grooming: Wash/dry  face;Set up;Sitting   Upper Body Bathing: Set up;Sitting   Lower Body Bathing: Maximal assistance;+2 for physical assistance;Sit to/from stand   Upper Body Dressing : Minimal assistance;Sitting (lines)   Lower Body Dressing: Total assistance;+2 for physical assistance;Sit to/from stand                 General ADL Comments: NT present and assisted with bathing.  Pt needed max A x 2 to stand from recliner.     Vision     Perception     Praxis      Pertinent Vitals/Pain Pain Assessment: 0-10 Pain Score: 0-No pain     Hand Dominance     Extremity/Trunk Assessment Upper Extremity Assessment Upper Extremity Assessment: RUE deficits/detail;LUE deficits/detail;Generalized weakness RUE Deficits / Details: limited to approximately 30 FF; AAROM to 80 painfree LUE Deficits / Details: limited to approximately 30 degrees FF; AAROM to 90 pain free   Lower Extremity Assessment PER PT Lower Extremity Assessment: RLE deficits/detail;LLE deficits/detail RLE Deficits / Details: decreased knee flexion, able to bear weight for standing LLE Deficits / Details: same as R   Cervical / Trunk Assessment Cervical / Trunk Assessment: Normal   Communication Communication Communication: HOH   Cognition Arousal/Alertness: Awake/alert Behavior During Therapy: WFL for tasks assessed/performed Overall Cognitive Status: Within Functional Limits for tasks assessed  General Comments       Exercises       Shoulder Instructions      Home Living Family/patient expects to be discharged to:: Unsure Living Arrangements: Children;Other relatives Available Help at Discharge: Family;Available 24 hours/day Type of Home: House Home Access: Level entry     Home Layout: One level               Home Equipment: Walker - standard;Cane - single point;Bedside commode;Walker - 2 wheels   Additional Comments: was at home with daughter and grandson      Prior  Functioning/Environment Level of Independence: Needs assistance  Gait / Transfers Assistance Needed: walks about 75' at home with RW with assist ADL's / Homemaking Assistance Needed: mod A for LB ADLs        OT Diagnosis: Generalized weakness   OT Problem List: Decreased strength;Decreased activity tolerance;Impaired balance (sitting and/or standing);Decreased knowledge of use of DME or AE   OT Treatment/Interventions: Self-care/ADL training;Therapeutic exercise;DME and/or AE instruction;Balance training;Patient/family education;Therapeutic activities    OT Goals(Current goals can be found in the care plan section) Acute Rehab OT Goals Patient Stated Goal: get stronger OT Goal Formulation: With patient Time For Goal Achievement: 10/08/14 Potential to Achieve Goals: Good ADL Goals Pt Will Transfer to Toilet: with min assist;with +2 assist;bedside commode;stand pivot transfer Additional ADL Goal #1: pt will go from sit to stand from high surface with min A and maintain for 2 minutes with min guard for adls Additional ADL Goal #2: Pt will perform table top shoulder exercise, 10 reps x 2 motions to increase bil UE strength for adls.  OT Frequency: Min 2X/week   Barriers to D/C:            Co-evaluation              End of Session    Activity Tolerance: Patient tolerated treatment well Patient left: in chair;with call bell/phone within reach   Time: 1323-1343 OT Time Calculation (min): 20 min Charges:  OT General Charges $OT Visit: 1 Procedure OT Evaluation $Initial OT Evaluation Tier I: 1 Procedure G-Codes:    Meghan Russo 2014-10-09, 2:11 PM   Marica Otter, OTR/L [phone] 2014-10-09

## 2014-09-24 NOTE — Progress Notes (Signed)
Report given to Belenda Cruise, RN and all questions answered. They will notify when room is ready.

## 2014-09-24 NOTE — Clinical Social Work Note (Signed)
Clinical Social Work Assessment  Patient Details  Name: Meghan Russo MRN: [id number] Date of Birth: 05/12/1920  Date of referral:  09/24/14               Reason for consult:  Facility Placement                Permission sought to share information with:  Oceanographer granted to share information::  Yes, Verbal Permission Granted  Name::        Agency::     Relationship::     Contact Information:     Housing/Transportation Living arrangements for the past 2 months:  Single Family Home Source of Information:  Adult Children Patient Interpreter Needed:  None Criminal Activity/Legal Involvement Pertinent to Current Situation/Hospitalization:  No - Comment as needed Significant Relationships:  Adult Children Lives with:  Relatives (grandson, Bernette Redbird) Do you feel safe going back to the place where you live?  No Need for family participation in patient care:  Yes (Comment)  Care giving concerns:  CSW reviewed PT evaluation recommending SNF at discharge.    Social Worker assessment / plan:  CSW spoke with patient, daughter - Britta Mccreedy & grandson, Bernette Redbird at bedside re: PT recommendation.   Employment status:  Retired Database administrator PT Recommendations:  Skilled Nursing Facility Information / Referral to community resources:  Skilled Nursing Facility  Patient/Family's Response to care:  Family is agreeable with plan to go to SNF at discharge, though informed CSW that she had been to [facility] 4/11-5/18/16 but would like to see what other SNFs have availability.   Patient/Family's Understanding of and Emotional Response to Diagnosis, Current Treatment, and Prognosis:  Patient's daughter is happy that patient has moved out of ICU, hoping she won't be in hospital much longer.   Emotional Assessment Appearance:  Appears stated age Attitude/Demeanor/Rapport:    Affect (typically observed):  Calm, Quiet Orientation:  Oriented  to Self, Oriented to Place, Oriented to  Time, Oriented to Situation Alcohol / Substance use:    Psych involvement (Current and /or in the community):     Discharge Needs  Concerns to be addressed:    Readmission within the last 30 days:    Current discharge risk:    Barriers to Discharge:      Arlyss Repress, LCSW 09/24/2014, 4:06 PM

## 2014-09-25 ENCOUNTER — Inpatient Hospital Stay (HOSPITAL_COMMUNITY): Payer: Medicare Other

## 2014-09-25 DIAGNOSIS — I5032 Chronic diastolic (congestive) heart failure: Secondary | ICD-10-CM

## 2014-09-25 DIAGNOSIS — L8990 Pressure ulcer of unspecified site, unspecified stage: Secondary | ICD-10-CM

## 2014-09-25 LAB — CBC
HCT: 23.6 % — ABNORMAL LOW (ref 36.0–46.0)
HEMOGLOBIN: 7.7 g/dL — AB (ref 12.0–15.0)
MCH: 30.6 pg (ref 26.0–34.0)
MCHC: 32.6 g/dL (ref 30.0–36.0)
MCV: 93.7 fL (ref 78.0–100.0)
Platelets: 130 10*3/uL — ABNORMAL LOW (ref 150–400)
RBC: 2.52 MIL/uL — AB (ref 3.87–5.11)
RDW: 16.5 % — ABNORMAL HIGH (ref 11.5–15.5)
WBC: 9.5 10*3/uL (ref 4.0–10.5)

## 2014-09-25 LAB — BASIC METABOLIC PANEL
ANION GAP: 10 (ref 5–15)
BUN: 94 mg/dL — AB (ref 6–20)
CHLORIDE: 112 mmol/L — AB (ref 101–111)
CO2: 20 mmol/L — AB (ref 22–32)
Calcium: 8.4 mg/dL — ABNORMAL LOW (ref 8.9–10.3)
Creatinine, Ser: 2.33 mg/dL — ABNORMAL HIGH (ref 0.44–1.00)
GFR calc non Af Amer: 17 mL/min — ABNORMAL LOW (ref >60)
GFR, EST AFRICAN AMERICAN: 20 mL/min — AB (ref >60)
Glucose, Bld: 97 mg/dL (ref 65–99)
Potassium: 3.9 mmol/L (ref 3.5–5.1)
Sodium: 142 mmol/L (ref 135–145)

## 2014-09-25 MED ORDER — SODIUM CHLORIDE 0.9 % IJ SOLN
10.0000 mL | INTRAMUSCULAR | Status: DC | PRN
  Administered 2014-09-26 – 2014-09-27 (×2): 10 mL
  Filled 2014-09-25 (×2): qty 40

## 2014-09-25 MED ORDER — ISOSORBIDE DINITRATE 20 MG PO TABS
20.0000 mg | ORAL_TABLET | Freq: Three times a day (TID) | ORAL | Status: DC
  Administered 2014-09-25 – 2014-09-27 (×6): 20 mg via ORAL
  Filled 2014-09-25 (×8): qty 1

## 2014-09-25 MED ORDER — SODIUM CHLORIDE 0.9 % IJ SOLN: 10.0000 mL | Freq: Two times a day (BID) | INTRAMUSCULAR | Status: DC

## 2014-09-25 NOTE — Progress Notes (Signed)
Peripherally Inserted Central Catheter/Midline Placement  The IV Nurse has discussed with the patient and/or persons authorized to consent for the patient, the purpose of this procedure and the potential benefits and risks involved with this procedure.  The benefits include less needle sticks, lab draws from the catheter and patient may be discharged home with the catheter.  Risks include, but not limited to, infection, bleeding, blood clot (thrombus formation), and puncture of an artery; nerve damage and irregular heat beat.  Alternatives to this procedure were also discussed.  PICC/Midline Placement Documentation  PICC / Midline Single Lumen 09/25/14 PICC Right Basilic 38 cm 0 cm (Active)  Indication for Insertion or Continuance of Line Poor Vasculature-patient has had multiple peripheral attempts or PIVs lasting less than 24 hours 09/25/2014 11:16 AM  Exposed Catheter (cm) 0 cm 09/25/2014 11:16 AM  Site Assessment Clean;Dry;Intact 09/25/2014 11:16 AM  Line Status Flushed;Saline locked;Blood return noted 09/25/2014 11:16 AM  Dressing Type Transparent 09/25/2014 11:16 AM  Dressing Change Due 10/02/14 09/25/2014 11:16 AM       Ethelda Chick 09/25/2014, 11:22 AM

## 2014-09-25 NOTE — Progress Notes (Signed)
PATIENT DETAILS Name: Meghan Russo Age: 81 y.o. Sex: female Date of Birth: 1921-01-30 Admit Date: 09/22/2014 Admitting Physician Therisa Doyne, MD WUJ:[id number],WGNFA N, MD  Subjective: No further hematochezia  Assessment/Plan: Active Problems: Painless hematochezia: Patient has never had a colonoscopy in the past, hence could have underlying neoplasm. However likely more to be a diverticular bleed. Gastroenterology consulted, family/patient opted for conservative management. Thankfully no further hematochezia. Slight drop in hemoglobin but otherwise stable. Monitor for now, if hemoglobin slowly starts to trend down, will transfuse one additional unit.  Acute blood loss anemia: Secondary to above. Transfused 2 units of PRBC, hemoglobin slightly decreased to 7.7-suspect slight drop from yesterday secondary to equilibration rather than ongoing bleeding. Follow hemoglobin and transfuse if any further drop in hemoglobin.   Valvular heart disease: Has history of moderate AS/AI/MR and severe TR with pulmonary hypertension. Reviewed outpatient cardiology note-conservative treatment has been recommended.  Acute on chronic kidney disease stage IV: Acute renal failure secondary to lower GI bleeding. Creatinine now close to usual baseline. Follow periodically.  Hypertension: Continue Lasix, resume Imdur-if blood pressure continues to stay stable-resume hydralazine on 6/26  Chronic diastolic heart failure: Clinically compensated. Continue Lasix.  Mild thrombocytopenia: Chronic issue: Monitor closely.  Pressure ulcer stage II: Wound care evaluation  Moderate to severe pulmonary hypertension: Secondary to valvular abnormalities. Outpatient monitoring.   History of Atrioventricular block, Mobitz type 1, Wenckebach: now BB stopped.  Note-no IV access-spoke with daughter-explained importance of having IV access if patient-especially if patient has a recurrence of hematochezia-'s  unfortunately a very hard stick and IV team unable to place an IV line. Explained concept of PICC line-although patient has advanced CK D-given advanced age-doubt a candidate for dialysis. Family/ patient agreeing for PICC line-they are aware that it will limit venous access in case of dialysis.  Disposition: Remain inpatient-SNF Monday  Antimicrobial agents  See below  Anti-infectives    None      DVT Prophylaxis: SCD's  Code Status: Full code   Family Communication Daughter at bedside  Procedures: None  CONSULTS:  GI  Time spent 40 minutes-Greater than 50% of this time was spent in counseling, explanation of diagnosis, planning of further management, and coordination of care.  MEDICATIONS: Scheduled Meds: . Chlorhexidine Gluconate Cloth  6 each Topical Q0600  . furosemide  80 mg Oral BID  . mupirocin ointment  1 application Nasal BID  . pantoprazole  40 mg Oral Q1200  . sodium chloride  10-40 mL Intracatheter Q12H   Continuous Infusions:  PRN Meds:.acetaminophen **OR** acetaminophen, HYDROcodone-acetaminophen, ondansetron **OR** ondansetron (ZOFRAN) IV, sodium chloride    PHYSICAL EXAM: Vital signs in last 24 hours: Filed Vitals:   09/24/14 1535 09/24/14 2107 09/25/14 0628 09/25/14 1319  BP: 143/39 140/35 113/30 135/49  Pulse: 64 72 67 71  Temp: 97.5 F (36.4 C) 98.1 F (36.7 C) 98.5 F (36.9 C) 98.3 F (36.8 C)  TempSrc: Oral Oral Oral Oral  Resp: Height:      Weight:      SpO2: 99% 98% 98% 99%    Weight change:  Filed Weights   09/22/14 2000  Weight: 79.5 kg (175 lb 4.3 oz)   Body mass index is 30.07 kg/(m^2).   Gen Exam: Awake and alert with clear speech.   Neck: Supple, No JVD.   Chest: B/L Clear.   CVS: S1 S2 Regular,syst murmurs.  Abdomen:  soft, BS +, non tender, non distended.  Extremities: no edema, lower extremities warm to touch. Neurologic: Non Focal.  Skin: No Rash.   Wounds: N/A.    Intake/Output from previous  day:  Intake/Output Summary (Last 24 hours) at 09/25/14 1335 Last data filed at 09/25/14 1300  Gross per 24 hour  Intake      0 ml  Output   3050 ml  Net  -3050 ml     LAB RESULTS: CBC  Recent Labs Lab 09/22/14 1732 09/23/14 0510 09/24/14 0330 09/25/14 0430  WBC 7.0 5.3 7.1 9.5  HGB 5.9* 8.0* 8.1* 7.7*  HCT 18.0* 24.0* 25.0* 23.6*  PLT 128* 113* 124* 130*  MCV 96.3 92.3 93.6 93.7  MCH 31.6 30.8 30.3 30.6  MCHC 32.8 33.3 32.4 32.6  RDW 16.5* 16.4* 16.9* 16.5*    Chemistries   Recent Labs Lab 09/22/14 1732 09/23/14 0510 09/24/14 0330 09/25/14 0430  NA 142 145 144 142  K 5.4* 5.2* 4.7 3.9  CL 115* 115* 115* 112*  CO2 18* 19* 18* 20*  GLUCOSE 91 79 73 97  BUN 106* 107* 100* 94*  CREATININE 2.84* 2.80* 2.64* 2.33*  CALCIUM 8.5* 8.7* 8.6* 8.4*  MG  --  2.2  --   --     CBG: No results for input(s): GLUCAP in the last 168 hours.  GFR Estimated Creatinine Clearance: 15.7 mL/min (by C-G formula based on Cr of 2.33).  Coagulation profile  Recent Labs Lab 09/22/14 1830  INR 1.34    Cardiac Enzymes No results for input(s): CKMB, TROPONINI, MYOGLOBIN in the last 168 hours.  Invalid input(s): CK  Invalid input(s): POCBNP No results for input(s): DDIMER in the last 72 hours. No results for input(s): HGBA1C in the last 72 hours. No results for input(s): CHOL, HDL, LDLCALC, TRIG, CHOLHDL, LDLDIRECT in the last 72 hours.  Recent Labs  09/23/14 0510  TSH 1.771   No results for input(s): VITAMINB12, FOLATE, FERRITIN, TIBC, IRON, RETICCTPCT in the last 72 hours. No results for input(s): LIPASE, AMYLASE in the last 72 hours.  Urine Studies No results for input(s): UHGB, CRYS in the last 72 hours.  Invalid input(s): UACOL, UAPR, USPG, UPH, UTP, UGL, UKET, UBIL, UNIT, UROB, ULEU, UEPI, UWBC, URBC, UBAC, CAST, UCOM, BILUA  MICROBIOLOGY: Recent Results (from the past 240 hour(s))  MRSA PCR Screening     Status: Abnormal   Collection Time: 09/22/14  8:25  PM  Result Value Ref Range Status   MRSA by PCR POSITIVE (A) NEGATIVE Final    Comment:        The GeneXpert MRSA Assay (FDA approved for NASAL specimens only), is one component of a comprehensive MRSA colonization surveillance program. It is not intended to diagnose MRSA infection nor to guide or monitor treatment for MRSA infections. RESULT CALLED TO, READ BACK BY AND VERIFIED WITH: SARA Aris [place] [id number] @ 2240 BY J SCOTTON     RADIOLOGY STUDIES/RESULTS: Dg Chest Port 1 View  09/25/2014   CLINICAL DATA:  Right-sided PICC line insertion today.  EXAM: PORTABLE CHEST - 1 VIEW  COMPARISON:  06/29/2014  FINDINGS: Interval placement of right-sided PICC line with tip over the right atrium. Lungs are adequately inflated demonstrate interval worsening of bilateral perihilar opacification and right basilar opacification. Stable cardiomegaly. Remainder of the exam is unchanged.  IMPRESSION: Moderate stable cardiomegaly with interval worsening of mild interstitial edema. Slight worsening of small right pleural effusion likely with associated atelectasis.  Right-sided PICC line with  tip over the right atrium.  These results were called by telephone at the time of interpretation on 09/25/2014 at 12:08 pm to patient's nurse, Orvan July, who verbally acknowledged these results.   Electronically Signed   By: Elberta Fortis M.D.   On: 09/25/2014 12:09    Jeoffrey Massed, MD  [facility] Pager:[phone]  If 7PM-7AM, please contact night-coverage [email or web address] Password [email or web address] 09/25/2014, 1:35 PM   LOS: 3 days

## 2014-09-25 NOTE — Consult Note (Addendum)
WOC wound consult note Reason for Consult: Pressure injury.  NO pressure injury found.  Noted is an area of incontinence associated dermatitis on the coccyx and two lower extremity ulcers. Wound type:Moisture associated skin damage and venous leg ulcers (VLUs) secondary to venous insufficiency (chronic) Pressure Ulcer POA:No Measurement:Coccyx:  1cm x 0.8cm x 0.2cm in an 8cm x 8cm area of maceration.  Right lateral malleolus:  1.2cm x 0.8cm x 0.2cm (partial thickness) pink, moist.  Left medial malleolus:  1.5cm x 1cm x 0.4cm, pink, moist (full thickness) Wound bed:As described above. Drainage (amount, consistency, odor) Scant serous from right malleolus and coccyx.  Small to moderate amount of serous drainage from left malleolus. Periwound:macerated from urine exposure in buttocks and perineal area.  No edema in LEs at this time.  Faint hemosiderin staining in the gaiter area bilaterally. Dressing procedure/placement/frequency: I will implement a therapeutic mattress overlay with los air loss feature for moisture and microclimate control and provide direction for skin care related to urinary incontinence. Bilateral heel pressure redistribution boots are provided for floatation of heels and alignment of feet while in bed.  WOC nursing team will not follow, but will remain available to this patient, the nursing and medical team.  Please re-consult if needed. Thanks, Ladona Mow, MSN, RN, GNP, [place], [facility]ON-AP [phone])

## 2014-09-26 ENCOUNTER — Inpatient Hospital Stay (HOSPITAL_COMMUNITY): Payer: Medicare Other

## 2014-09-26 DIAGNOSIS — N183 Chronic kidney disease, stage 3 (moderate): Secondary | ICD-10-CM

## 2014-09-26 LAB — CBC
HCT: 24.9 % — ABNORMAL LOW (ref 36.0–46.0)
Hemoglobin: 8.2 g/dL — ABNORMAL LOW (ref 12.0–15.0)
MCH: 31.1 pg (ref 26.0–34.0)
MCHC: 32.9 g/dL (ref 30.0–36.0)
MCV: 94.3 fL (ref 78.0–100.0)
PLATELETS: 165 10*3/uL (ref 150–400)
RBC: 2.64 MIL/uL — ABNORMAL LOW (ref 3.87–5.11)
RDW: 16.4 % — ABNORMAL HIGH (ref 11.5–15.5)
WBC: 10.7 10*3/uL — ABNORMAL HIGH (ref 4.0–10.5)

## 2014-09-26 MED ORDER — SENNOSIDES-DOCUSATE SODIUM 8.6-50 MG PO TABS
2.0000 | ORAL_TABLET | Freq: Every day | ORAL | Status: DC
  Administered 2014-09-26 – 2014-09-27 (×2): 2 via ORAL
  Filled 2014-09-26 (×2): qty 2

## 2014-09-26 MED ORDER — IOHEXOL 300 MG/ML  SOLN
50.0000 mL | Freq: Once | INTRAMUSCULAR | Status: AC | PRN
  Administered 2014-09-26: 50 mL via ORAL

## 2014-09-26 MED ORDER — POLYETHYLENE GLYCOL 3350 17 G PO PACK
17.0000 g | PACK | Freq: Every day | ORAL | Status: DC
  Administered 2014-09-26 – 2014-09-27 (×2): 17 g via ORAL
  Filled 2014-09-26 (×2): qty 1

## 2014-09-26 NOTE — Progress Notes (Signed)
PATIENT DETAILS Name: Meghan Russo Age: 81 y.o. Sex: female Date of Birth: 06-05-1920 Admit Date: 09/22/2014 Admitting Physician Therisa Doyne, MD UXN:[id number],[id number] N, MD  Subjective: No hematochezia overnight. Complains of right lower flank pain. No BM for 2-3 days-passing gas-tolerating PO intake  Assessment/Plan: Active Problems: Painless hematochezia: Patient has never had a colonoscopy in the past, hence could have underlying neoplasm. However likely more to be a diverticular bleed. Gastroenterology consulted, family/patient opted for conservative management. Thankfully no further hematochezia. Hemoglobin stable.   Acute blood loss anemia: Secondary to above. Transfused 2 units of PRBC, hemoglobin remains stable. Follow hemoglobin and transfuse if any further drop in hemoglobin.   Right lower abd pain:belly is soft-tolerating diet. Will check CT Abd-without IV contrast. Start bowel regimen with Miralax and Senokot.  Valvular heart disease: Has history of moderate AS/AI/MR and severe TR with pulmonary hypertension. Reviewed outpatient cardiology note-conservative treatment has been recommended.  Acute on chronic kidney disease stage IV: Acute renal failure secondary to lower GI bleeding. Creatinine now close to usual baseline. Follow periodically.  Hypertension: Continue Lasix, resume Imdur-if blood pressure continues to stay stable-resume hydralazine over the next few days  Chronic diastolic heart failure: Clinically compensated. Continue Lasix.  Mild thrombocytopenia: Chronic issue: Monitor closely.  Pressure ulcer stage II: Wound care evaluation appreciated  Moderate to severe pulmonary hypertension: Secondary to valvular abnormalities. Outpatient monitoring.   History of Atrioventricular block, Mobitz type 1, Wenckebach: now BB stopped.  Disposition: Remain inpatient-SNF Monday  Antimicrobial agents  See below  Anti-infectives    None       DVT Prophylaxis: SCD's  Code Status: Full code   Family Communication None at bedside  Procedures: None  CONSULTS:  GI  Time spent 35 minutes-Greater than 50% of this time was spent in counseling, explanation of diagnosis, planning of further management, and coordination of care.  MEDICATIONS: Scheduled Meds: . Chlorhexidine Gluconate Cloth  6 each Topical Q0600  . furosemide  80 mg Oral BID  . isosorbide dinitrate  20 mg Oral TID  . mupirocin ointment  1 application Nasal BID  . pantoprazole  40 mg Oral Q1200  . polyethylene glycol  17 g Oral Daily  . senna-docusate  2 tablet Oral Daily  . sodium chloride  10-40 mL Intracatheter Q12H   Continuous Infusions:  PRN Meds:.acetaminophen **OR** acetaminophen, HYDROcodone-acetaminophen, ondansetron **OR** ondansetron (ZOFRAN) IV, sodium chloride    PHYSICAL EXAM: Vital signs in last 24 hours: Filed Vitals:   09/25/14 0628 09/25/14 1319 09/25/14 2237 09/26/14 0356  BP: 113/30 135/49 115/43 101/39  Pulse: 67 71 65 74  Temp: 98.5 F (36.9 C) 98.3 F (36.8 C) 98.6 F (37 C) 98.3 F (36.8 C)  TempSrc: Oral Oral Oral Oral  Resp: 16 14 16 18   Height:      Weight:    68.629 kg (151 lb 4.8 oz)  SpO2: 98% 99% 99% 97%    Weight change:  Filed Weights   09/22/14 2000 09/26/14 0356  Weight: 79.5 kg (175 lb 4.3 oz) 68.629 kg (151 lb 4.8 oz)   Body mass index is 25.96 kg/(m^2).   Gen Exam: Awake and alert with clear speech.   Neck: Supple, No JVD.   Chest: B/L Clear.   CVS: S1 S2 Regular,syst murmurs.  Abdomen: soft, BS +, mild distended-mildly tender in the right mid flank area. No rebound Extremities: no edema, lower extremities warm to touch.  Neurologic: Non Focal.  Skin: No Rash.   Wounds: N/A.    Intake/Output from previous day:  Intake/Output Summary (Last 24 hours) at 09/26/14 0844 Last data filed at 09/26/14 0500  Gross per 24 hour  Intake    480 ml  Output   1300 ml  Net   -820 ml     LAB  RESULTS: CBC  Recent Labs Lab 09/22/14 1732 09/23/14 0510 09/24/14 0330 09/25/14 0430 09/26/14 0500  WBC 7.0 5.3 7.1 9.5 10.7*  HGB 5.9* 8.0* 8.1* 7.7* 8.2*  HCT 18.0* 24.0* 25.0* 23.6* 24.9*  PLT 128* 113* 124* 130* 165  MCV 96.3 92.3 93.6 93.7 94.3  MCH 31.6 30.8 30.3 30.6 31.1  MCHC 32.8 33.3 32.4 32.6 32.9  RDW 16.5* 16.4* 16.9* 16.5* 16.4*    Chemistries   Recent Labs Lab 09/22/14 1732 09/23/14 0510 09/24/14 0330 09/25/14 0430  NA 142 145 144 142  K 5.4* 5.2* 4.7 3.9  CL 115* 115* 115* 112*  CO2 18* 19* 18* 20*  GLUCOSE 91 79 73 97  BUN 106* 107* 100* 94*  CREATININE 2.84* 2.80* 2.64* 2.33*  CALCIUM 8.5* 8.7* 8.6* 8.4*  MG  --  2.2  --   --     CBG: No results for input(s): GLUCAP in the last 168 hours.  GFR Estimated Creatinine Clearance: 14.7 mL/min (by C-G formula based on Cr of 2.33).  Coagulation profile  Recent Labs Lab 09/22/14 1830  INR 1.34    Cardiac Enzymes No results for input(s): CKMB, TROPONINI, MYOGLOBIN in the last 168 hours.  Invalid input(s): CK  Invalid input(s): POCBNP No results for input(s): DDIMER in the last 72 hours. No results for input(s): HGBA1C in the last 72 hours. No results for input(s): CHOL, HDL, LDLCALC, TRIG, CHOLHDL, LDLDIRECT in the last 72 hours. No results for input(s): TSH, T4TOTAL, T3FREE, THYROIDAB in the last 72 hours.  Invalid input(s): FREET3 No results for input(s): VITAMINB12, FOLATE, FERRITIN, TIBC, IRON, RETICCTPCT in the last 72 hours. No results for input(s): LIPASE, AMYLASE in the last 72 hours.  Urine Studies No results for input(s): UHGB, CRYS in the last 72 hours.  Invalid input(s): UACOL, UAPR, USPG, UPH, UTP, UGL, UKET, UBIL, UNIT, UROB, ULEU, UEPI, UWBC, URBC, UBAC, CAST, UCOM, BILUA  MICROBIOLOGY: Recent Results (from the past 240 hour(s))  MRSA PCR Screening     Status: Abnormal   Collection Time: 09/22/14  8:25 PM  Result Value Ref Range Status   MRSA by PCR POSITIVE (A)  NEGATIVE Final    Comment:        The GeneXpert MRSA Assay (FDA approved for NASAL specimens only), is one component of a comprehensive MRSA colonization surveillance program. It is not intended to diagnose MRSA infection nor to guide or monitor treatment for MRSA infections. RESULT CALLED TO, READ BACK BY AND VERIFIED WITH: SARA Aris [place] [id number] @ 2240 BY J SCOTTON     RADIOLOGY STUDIES/RESULTS: Dg Chest Port 1 View  09/25/2014   CLINICAL DATA:  Right-sided PICC line insertion today.  EXAM: PORTABLE CHEST - 1 VIEW  COMPARISON:  06/29/2014  FINDINGS: Interval placement of right-sided PICC line with tip over the right atrium. Lungs are adequately inflated demonstrate interval worsening of bilateral perihilar opacification and right basilar opacification. Stable cardiomegaly. Remainder of the exam is unchanged.  IMPRESSION: Moderate stable cardiomegaly with interval worsening of mild interstitial edema. Slight worsening of small right pleural effusion likely with associated atelectasis.  Right-sided PICC line with tip  over the right atrium.  These results were called by telephone at the time of interpretation on 09/25/2014 at 12:08 pm to patient's nurse, Orvan July, who verbally acknowledged these results.   Electronically Signed   By: Elberta Fortis M.D.   On: 09/25/2014 12:09    Jeoffrey Massed, MD  [facility] Pager:[phone]  If 7PM-7AM, please contact night-coverage [email or web address] Password [facility] 09/26/2014, 8:44 AM   LOS: 4 days

## 2014-09-27 DIAGNOSIS — D696 Thrombocytopenia, unspecified: Secondary | ICD-10-CM

## 2014-09-27 DIAGNOSIS — I10 Essential (primary) hypertension: Secondary | ICD-10-CM

## 2014-09-27 MED ORDER — PANTOPRAZOLE SODIUM 40 MG PO TBEC: 40.0000 mg | DELAYED_RELEASE_TABLET | Freq: Every day | ORAL | Status: AC

## 2014-09-27 MED ORDER — HYDROCODONE-ACETAMINOPHEN 5-325 MG PO TABS: 1.0000 | ORAL_TABLET | Freq: Four times a day (QID) | ORAL | Status: AC | PRN

## 2014-09-27 MED ORDER — POLYETHYLENE GLYCOL 3350 17 G PO PACK: 17.0000 g | PACK | Freq: Every day | ORAL | Status: AC

## 2014-09-27 NOTE — Care Management (Deleted)
IM LETTER GIVEN TO PATIENT  

## 2014-09-27 NOTE — Progress Notes (Signed)
Very large, loose, dark brown incont stool no visible blood noted.

## 2014-09-27 NOTE — Progress Notes (Signed)
[facility] >10 minutes. Will attempt to call again in 30 minutes. Earnest ConroyBrooke M. Clelia CroftShaw, RN

## 2014-09-27 NOTE — Care Management (Signed)
PATIENT HAD DISCHARGED EARLIER IN MORNING

## 2014-09-27 NOTE — Discharge Summary (Signed)
PATIENT DETAILS Name: Meghan Russo Age: 81 y.o. Sex: female Date of Birth: 1921/01/11 MRN: [id number]. Admitting Physician: Therisa Doyne, MD TGP:QDIYMEB,RAXEN N, MD  Admit Date: 09/22/2014 Discharge date: 09/27/2014  Recommendations for Outpatient Follow-up:  1. Please recheck CBC and BMET in 1 week.   PRIMARY DISCHARGE DIAGNOSIS:  Active Problems:   Essential hypertension - labile   Anemia   Thrombocytopenia   Moderate to severe pulmonary hypertension   Atrioventricular block, Mobitz type 1, Wenckebach: now BB stopped.   Chronic diastolic CHF (congestive heart failure)   Chronic kidney disease, stage III (moderate)   Rectal bleeding   GI bleeding   Pressure ulcer   Acute blood loss anemia   Lower GI bleed      PAST MEDICAL HISTORY: Past Medical History  Diagnosis Date  . Hypertension   . Chronic kidney disease, stage III (moderate) 2013  . Moderate aortic stenosis 12/2013  . Moderate to severe pulmonary hypertension   . Arthritis     knee pain   . Cataract   . Bradycardia     a. 03/2014: HR 30's, sinus pauses and Wenkebach - BB discontinued.  . Valvular heart disease 2006    severe tricuspid regurge, moderate aortic insufficiency, mild mitral regurge  . Chronic diastolic CHF (congestive heart failure)   . Anemia 2005    Normocytic  . Osteoporosis 2005  . Pericarditis 03/2004    DISCHARGE MEDICATIONS: Current Discharge Medication List    START taking these medications   Details  HYDROcodone-acetaminophen (NORCO/VICODIN) 5-325 MG per tablet Take 1 tablet by mouth every 6 (six) hours as needed for moderate pain. Qty: 30 tablet, Refills: 0    pantoprazole (PROTONIX) 40 MG tablet Take 1 tablet (40 mg total) by mouth daily at 12 noon.    polyethylene glycol (MIRALAX / GLYCOLAX) packet Take 17 g by mouth daily. Qty: 14 each, Refills: 0      CONTINUE these medications which have NOT CHANGED   Details  acetaminophen (TYLENOL) 500 MG tablet Take 1,000  mg by mouth every 6 (six) hours as needed for moderate pain (pain).     furosemide (LASIX) 80 MG tablet Take 1 tablet (80 mg total) by mouth 3 (three) times daily. Qty: 90 tablet, Refills: 11    isosorbide dinitrate (ISORDIL) 20 MG tablet Take 1 tablet (20 mg total) by mouth 3 (three) times daily. Qty: 90 tablet, Refills: 5    potassium chloride SA (K-DUR,KLOR-CON) 20 MEQ tablet Take 1 tablet (20 mEq total) by mouth daily. Qty: 60 tablet, Refills: 11      STOP taking these medications     aspirin 81 MG tablet      hydrALAZINE (APRESOLINE) 50 MG tablet         ALLERGIES:   Allergies  Allergen Reactions  . Asa [Aspirin] Other (See Comments)    GI Upset    BRIEF HPI:  See H&P, Labs, Consult and Test reports for all details in brief, patient is a 81 year old female with multiple valvular heart diseases-presented to the ED for evaluation of painless hematochezia.  CONSULTATIONS:   GI  PERTINENT RADIOLOGIC STUDIES: Ct Abdomen Pelvis Wo Contrast  09/26/2014   CLINICAL DATA:  Hematochezia with drop in hemoglobin. Question underlying neoplasm. Possible diverticular bleed.  EXAM: CT ABDOMEN AND PELVIS WITHOUT CONTRAST  TECHNIQUE: Multidetector CT imaging of the abdomen and pelvis was performed following the standard protocol without IV contrast.  COMPARISON:  None.  FINDINGS: Lower chest: Bilateral pleural  effusions. Right basilar atelectasis or infiltrates. The heart is enlarged. Dense coronary artery calcifications.  Upper abdomen: No focal abnormality in the spleen, pancreas, or adrenal glands. The liver appears small and minimally nodular raising the question of cirrhosis. Gallbladder contains numerous stones which distend the wall.  Gastrointestinal tract: The stomach, small bowel loops have a normal appearance. There are numerous colonic diverticula. Given the amount of ascites, acute diverticulitis cannot be assessed. No evidence for colonic mass or obstruction.  Pelvis: Uterus is  present and contains numerous coarse calcifications consistent with fibroids. There is a large amount of ascites.  Retroperitoneum: The aorta is tortuous and calcified. No aneurysm. No retroperitoneal or mesenteric adenopathy.  Abdominal wall: Diffuse anasarca.  Fat containing umbilical hernia.  Osseous structures: Degenerative changes are seen in the lower thoracic and lumbar spine. Visualized osseous structures have a normal appearance.  IMPRESSION: 1. Large amount of ascites. 2. Question of cirrhotic morphology of the liver. 3. Bilateral pleural effusions. Bibasilar atelectasis or infiltrates. 4. Cardiomegaly and coronary artery disease. 5. Numerous colonic diverticula. Diverticulitis cannot be excluded given the amount of ascites. 6. Multiple uterine fibroids. 7. Aortic atherosclerosis. 8. Anasarca. 9. Fat containing umbilical hernia.   Electronically Signed   By: Norva Pavlov M.D.   On: 09/26/2014 11:15   Dg Chest Port 1 View  09/25/2014   CLINICAL DATA:  Right-sided PICC line insertion today.  EXAM: PORTABLE CHEST - 1 VIEW  COMPARISON:  06/29/2014  FINDINGS: Interval placement of right-sided PICC line with tip over the right atrium. Lungs are adequately inflated demonstrate interval worsening of bilateral perihilar opacification and right basilar opacification. Stable cardiomegaly. Remainder of the exam is unchanged.  IMPRESSION: Moderate stable cardiomegaly with interval worsening of mild interstitial edema. Slight worsening of small right pleural effusion likely with associated atelectasis.  Right-sided PICC line with tip over the right atrium.  These results were called by telephone at the time of interpretation on 09/25/2014 at 12:08 pm to patient's nurse, Orvan July, who verbally acknowledged these results.   Electronically Signed   By: Elberta Fortis M.D.   On: 09/25/2014 12:09     PERTINENT LAB RESULTS: CBC:  Recent Labs  09/25/14 0430 09/26/14 0500  WBC 9.5 10.7*  HGB 7.7* 8.2*    HCT 23.6* 24.9*  PLT 130* 165   CMET CMP     Component Value Date/Time   NA 142 09/25/2014 0430   NA 140 03/29/2014 1146   K 3.9 09/25/2014 0430   CL 112* 09/25/2014 0430   CO2 20* 09/25/2014 0430   GLUCOSE 97 09/25/2014 0430   GLUCOSE 92 03/29/2014 1146   BUN 94* 09/25/2014 0430   BUN 70* 03/29/2014 1146   CREATININE 2.33* 09/25/2014 0430   CREATININE 2.47* 06/22/2014 1002   CALCIUM 8.4* 09/25/2014 0430   PROT 6.5 09/23/2014 0510   ALBUMIN 2.8* 09/23/2014 0510   AST 12* 09/23/2014 0510   ALT 9* 09/23/2014 0510   ALKPHOS 86 09/23/2014 0510   BILITOT 1.1 09/23/2014 0510   GFRNONAA 17* 09/25/2014 0430   GFRAA 20* 09/25/2014 0430    GFR Estimated Creatinine Clearance: 13.3 mL/min (by C-G formula based on Cr of 2.33). No results for input(s): LIPASE, AMYLASE in the last 72 hours. No results for input(s): CKTOTAL, CKMB, CKMBINDEX, TROPONINI in the last 72 hours. Invalid input(s): POCBNP No results for input(s): DDIMER in the last 72 hours. No results for input(s): HGBA1C in the last 72 hours. No results for input(s): CHOL, HDL, LDLCALC, TRIG,  CHOLHDL, LDLDIRECT in the last 72 hours. No results for input(s): TSH, T4TOTAL, T3FREE, THYROIDAB in the last 72 hours.  Invalid input(s): FREET3 No results for input(s): VITAMINB12, FOLATE, FERRITIN, TIBC, IRON, RETICCTPCT in the last 72 hours. Coags: No results for input(s): INR in the last 72 hours.  Invalid input(s): PT Microbiology: Recent Results (from the past 240 hour(s))  MRSA PCR Screening     Status: Abnormal   Collection Time: 09/22/14  8:25 PM  Result Value Ref Range Status   MRSA by PCR POSITIVE (A) NEGATIVE Final    Comment:        The GeneXpert MRSA Assay (FDA approved for NASAL specimens only), is one component of a comprehensive MRSA colonization surveillance program. It is not intended to diagnose MRSA infection nor to guide or monitor treatment for MRSA infections. RESULT CALLED TO, READ BACK BY AND  VERIFIED WITH: SARA Aris [place] [id number] @ 2240 BY J SCOTTON      BRIEF HOSPITAL COURSE:  Painless hematochezia: Patient has never had a colonoscopy in the past, hence could have underlying neoplasm. However likely more to be a diverticular bleed. Gastroenterology consulted, family/patient opted for conservative management. Thankfully no further hematochezia. Hemoglobin stable at 8.2  Acute blood loss anemia: Secondary to above. Transfused 2 units of PRBC, hemoglobin remains stable at 8.2. Please recheck CBC in 1 week.   Right lower abd pain:resolved, CT Abd showed ascities-but no acute changes. Supportive care-tolerating diet.  Ascites:seen on CT Abd-suspect cardiogenic-continue Lasix-not tense. Given advanced age, frailty-suspect best managed by diuretics. Currently abd is not tense-no indication for paracentesis at this time.   Valvular heart disease: Has history of moderate AS/AI/MR and severe TR with pulmonary hypertension. Reviewed outpatient cardiology note-conservative treatment has been recommended.  Acute on chronic kidney disease stage IV: Acute renal failure secondary to lower GI bleeding. Creatinine now close to usual baseline. Follow periodically.  Hypertension: Continue Lasix, continue Imdur and resume hydralazine over the next few days if BP can tolerate  Chronic diastolic heart failure: Clinically compensated. Continue Lasix.Monitor lytes closely-please repeat lytes in 1 week.  Mild thrombocytopenia: Chronic issue: Monitor closely.  Pressure ulcer stage II: Wound care evaluation appreciated-recommendations are: mplement a therapeutic mattress overlay with los air loss feature for moisture and microclimate control and provide direction for skin care related to urinary incontinence. Bilateral heel pressure redistribution boots are provided for floatation of heels and alignment of feet while in bed.   Moderate to severe pulmonary hypertension: Secondary to valvular  abnormalities. Outpatient monitoring.   History of Atrioventricular block, Mobitz type 1, Wenckebach: avoid beta blockers, calcium channel blockers   TODAY-DAY OF DISCHARGE:  Subjective:   Meghan Russo today has no headache,no chest abdominal pain,no new weakness tingling or numbness  Objective:   Blood pressure 105/40, pulse 105, temperature 99 F (37.2 C), temperature source Oral, resp. rate 18, height 5\' 4"  (1.626 m), weight 65 kg (143 lb 4.8 oz), SpO2 99 %.  Intake/Output Summary (Last 24 hours) at 09/27/14 0800 Last data filed at 09/27/14 0434  Gross per 24 hour  Intake    720 ml  Output      0 ml  Net    720 ml   Filed Weights   09/22/14 2000 09/26/14 0356 09/27/14 0354  Weight: 79.5 kg (175 lb 4.3 oz) 68.629 kg (151 lb 4.8 oz) 65 kg (143 lb 4.8 oz)    Exam Awake Alert, Oriented *3, No new F.N deficits, Normal affect Ramona.AT,PERRAL Supple Neck,No JVD,  No cervical lymphadenopathy appriciated.  Symmetrical Chest wall movement, Good air movement bilaterally, CTAB RRR,No Gallops,Rubs or new Murmurs, No Parasternal Heave +ve B.Sounds, Abd Soft, Non tender, No organomegaly appriciated, No rebound -guarding or rigidity. No Cyanosis, Clubbing or edema, No new Rash or bruise  DISCHARGE CONDITION: Stable  DISPOSITION: SNF  DISCHARGE INSTRUCTIONS:    Activity:  As tolerated with Full fall precautions use walker/cane & assistance as needed  Diet recommendation: Heart Healthy diet Fluid restriction 1.5 lit/day Aspiration precautions:yes  Discharge Instructions    (HEART FAILURE PATIENTS) Call MD:  Anytime you have any of the following symptoms: 1) 3 pound weight gain in 24 hours or 5 pounds in 1 week 2) shortness of breath, with or without a dry hacking cough 3) swelling in the hands, feet or stomach 4) if you have to sleep on extra pillows at night in order to breathe.    Complete by:  As directed      Call MD for:    Complete by:  As directed   Hematochezia     Diet  - low sodium heart healthy    Complete by:  As directed      Increase activity slowly    Complete by:  As directed            Follow-up Information    Follow up with [name], MD. Schedule an appointment as soon as possible for a visit in 2 weeks.   Specialty:  Internal Medicine   Contact information:   [address] [place] [address] [phone]       Follow up with Nanetta Batty, MD. Schedule an appointment as soon as possible for a visit in 2 weeks.   Specialties:  Cardiology, Radiology   Contact information:   [address] [place] [address] [phone]       Total Time spent on discharge equals  45 minutes.  SignedJeoffrey Massed 09/27/2014 8:00 AM

## 2014-09-27 NOTE — Progress Notes (Signed)
Report called to [place] at [facility].  Earnest Conroy. Clelia Croft, RN

## 2014-09-27 NOTE — Clinical Social Work Placement (Signed)
[facility]. Patient & daughter, [name] aware. Discharge packet given to RN, Nehemiah SettleBrooke. [facility] called for transport.     Lincoln MaxinKelly Annamay Laymon, LCSW [facility] Clinical Social Worker cell #: [phone]    CLINICAL SOCIAL WORK PLACEMENT  NOTE  Date:  09/27/2014  Patient Details  Name: Meghan ChanceJanie H Fisher MRN: [id number] Date of Birth: 08/22/1920  Clinical Social Work is seeking post-discharge placement for this patient at the Skilled  Nursing Facility level of care (*CSW will initial, date and re-position this form in  chart as items are completed):  Yes   Patient/family provided with [facility] Clinical Social Work Department's list of facilities offering this level of care within the geographic area requested by the patient (or if unable, by the patient's family).  Yes   Patient/family informed of their freedom to choose among providers that offer the needed level of care, that participate in Medicare, Medicaid or managed care program needed by the patient, have an available bed and are willing to accept the patient.  Yes   Patient/family informed of 's ownership interest in [facility] and [facility], as well as of the fact that they are under no obligation to receive care at these facilities.  PASRR submitted to EDS on       PASRR number received on       Existing PASRR number confirmed on 09/24/14     FL2 transmitted to all facilities in geographic area requested by pt/family on 09/24/14     FL2 transmitted to all facilities within larger geographic area on       Patient informed that his/her managed care company has contracts with or will negotiate with certain facilities, including the following:        Yes   Patient/family informed of bed offers received.  [facility]     Physician recommends and patient chooses bed at      [facility].  Patient to be transferred to facility by [facility]     Patient family notified on 09/27/14 of transfer.  Name of family member notified:  patient's daughter, [name] via phone     PHYSICIAN       Additional Comment:    _______________________________________________ Arlyss RepressHarrison, Najia Hurlbutt F, LCSW 09/27/2014, 9:59 AM

## 2014-09-27 NOTE — Progress Notes (Signed)
PICC Line bandage in place. No more active bleeding. Will cont to monitor.  Earnest Conroy. Clelia Croft, RN

## 2014-10-20 ENCOUNTER — Telehealth: Payer: Self-pay | Admitting: Cardiovascular Disease

## 2014-10-20 NOTE — Telephone Encounter (Signed)
Pt has about 40 lbs of fluid on her. She is in a facility[facility] and her daughter wants tp know if you can call and increase her fluid medicine or does she have to go back to the hospital?

## 2014-10-20 NOTE — Telephone Encounter (Signed)
Called patient's daughter, she stated concern - pt had apparent 40 lbs of fluid. Does not outline any other specific problems that she is aware of. Wanted to be sure she was aware of plan of care, make sure we knew pt had been in hospital (r/t rectal bleed). Informed caller I would be glad to call nursing home, check on pt status.  [facility], [place] - she states known issue of edema, pt has been on 80mg  lasix TID since arrival - pt state not worse or improved since arrival. Outlines no problems w/ shortness of breath, other concerns. Apparently MD in house ordered some tests - she will fax results to our office if anything concerning.  Spoke again to daughter, passed along information given  - she voiced thanks, understanding - will follow up w/ nursing home if anything needed.

## 2014-11-01 ENCOUNTER — Other Ambulatory Visit: Payer: Self-pay | Admitting: Nurse Practitioner

## 2014-11-01 ENCOUNTER — Other Ambulatory Visit: Payer: Self-pay | Admitting: Internal Medicine

## 2014-11-01 DIAGNOSIS — R609 Edema, unspecified: Secondary | ICD-10-CM

## 2014-11-17 ENCOUNTER — Telehealth: Payer: Self-pay | Admitting: Cardiovascular Disease

## 2014-11-17 NOTE — Telephone Encounter (Signed)
Meghan Russo, social worker with [facility], called in with request for medical records.   She reports the patient is preparing for discharge and a care plan meeting was conducted today with the family and the family wanted more communication between Dr. Eula Listen (their facility MD) and Dr. Allyson Sabal. She states Dr. Eula Listen is in the facility tomorrow 8/18 and would like to review an office visit note from Dr. Allyson Sabal if possible. Informed Rodney Booze that I would pass this message along to medical records to process this request  Office visit note can be faxed to [facility] @ [phone] Attn: Meghan Russo

## 2014-11-18 ENCOUNTER — Inpatient Hospital Stay (HOSPITAL_COMMUNITY): Payer: Medicare Other

## 2014-11-18 ENCOUNTER — Encounter (HOSPITAL_COMMUNITY): Payer: Self-pay | Admitting: Emergency Medicine

## 2014-11-18 ENCOUNTER — Inpatient Hospital Stay (HOSPITAL_COMMUNITY)
Admission: EM | Admit: 2014-11-18 | Discharge: 2014-11-23 | DRG: 292 | Disposition: A | Discharge status: Hospice | Payer: Medicare Other | Attending: Internal Medicine | Admitting: Internal Medicine

## 2014-11-18 DIAGNOSIS — D649 Anemia, unspecified: Secondary | ICD-10-CM | POA: Diagnosis present

## 2014-11-18 DIAGNOSIS — N179 Acute kidney failure, unspecified: Secondary | ICD-10-CM | POA: Diagnosis present

## 2014-11-18 DIAGNOSIS — I272 Other secondary pulmonary hypertension: Secondary | ICD-10-CM | POA: Diagnosis present

## 2014-11-18 DIAGNOSIS — Z79899 Other long term (current) drug therapy: Secondary | ICD-10-CM

## 2014-11-18 DIAGNOSIS — M810 Age-related osteoporosis without current pathological fracture: Secondary | ICD-10-CM | POA: Diagnosis present

## 2014-11-18 DIAGNOSIS — R609 Edema, unspecified: Secondary | ICD-10-CM | POA: Diagnosis not present

## 2014-11-18 DIAGNOSIS — I350 Nonrheumatic aortic (valve) stenosis: Secondary | ICD-10-CM

## 2014-11-18 DIAGNOSIS — I482 Chronic atrial fibrillation, unspecified: Secondary | ICD-10-CM

## 2014-11-18 DIAGNOSIS — Z888 Allergy status to other drugs, medicaments and biological substances status: Secondary | ICD-10-CM | POA: Diagnosis not present

## 2014-11-18 DIAGNOSIS — R188 Other ascites: Secondary | ICD-10-CM | POA: Diagnosis present

## 2014-11-18 DIAGNOSIS — R600 Localized edema: Secondary | ICD-10-CM | POA: Diagnosis not present

## 2014-11-18 DIAGNOSIS — R32 Unspecified urinary incontinence: Secondary | ICD-10-CM | POA: Diagnosis present

## 2014-11-18 DIAGNOSIS — I441 Atrioventricular block, second degree: Secondary | ICD-10-CM | POA: Diagnosis present

## 2014-11-18 DIAGNOSIS — K219 Gastro-esophageal reflux disease without esophagitis: Secondary | ICD-10-CM | POA: Diagnosis present

## 2014-11-18 DIAGNOSIS — L89152 Pressure ulcer of sacral region, stage 2: Secondary | ICD-10-CM | POA: Diagnosis present

## 2014-11-18 DIAGNOSIS — M13869 Other specified arthritis, unspecified knee: Secondary | ICD-10-CM | POA: Diagnosis present

## 2014-11-18 DIAGNOSIS — M7989 Other specified soft tissue disorders: Secondary | ICD-10-CM | POA: Diagnosis not present

## 2014-11-18 DIAGNOSIS — Z8249 Family history of ischemic heart disease and other diseases of the circulatory system: Secondary | ICD-10-CM | POA: Diagnosis not present

## 2014-11-18 DIAGNOSIS — I083 Combined rheumatic disorders of mitral, aortic and tricuspid valves: Secondary | ICD-10-CM | POA: Diagnosis present

## 2014-11-18 DIAGNOSIS — R159 Full incontinence of feces: Secondary | ICD-10-CM | POA: Diagnosis present

## 2014-11-18 DIAGNOSIS — R601 Generalized edema: Secondary | ICD-10-CM | POA: Diagnosis present

## 2014-11-18 DIAGNOSIS — I10 Essential (primary) hypertension: Secondary | ICD-10-CM | POA: Diagnosis present

## 2014-11-18 DIAGNOSIS — Z7189 Other specified counseling: Secondary | ICD-10-CM | POA: Insufficient documentation

## 2014-11-18 DIAGNOSIS — I071 Rheumatic tricuspid insufficiency: Secondary | ICD-10-CM | POA: Diagnosis present

## 2014-11-18 DIAGNOSIS — I129 Hypertensive chronic kidney disease with stage 1 through stage 4 chronic kidney disease, or unspecified chronic kidney disease: Secondary | ICD-10-CM | POA: Diagnosis present

## 2014-11-18 DIAGNOSIS — N184 Chronic kidney disease, stage 4 (severe): Secondary | ICD-10-CM | POA: Diagnosis present

## 2014-11-18 DIAGNOSIS — Z515 Encounter for palliative care: Secondary | ICD-10-CM

## 2014-11-18 DIAGNOSIS — I509 Heart failure, unspecified: Secondary | ICD-10-CM

## 2014-11-18 DIAGNOSIS — I4891 Unspecified atrial fibrillation: Secondary | ICD-10-CM | POA: Insufficient documentation

## 2014-11-18 DIAGNOSIS — Z66 Do not resuscitate: Secondary | ICD-10-CM | POA: Diagnosis present

## 2014-11-18 DIAGNOSIS — Z8679 Personal history of other diseases of the circulatory system: Secondary | ICD-10-CM

## 2014-11-18 DIAGNOSIS — I5030 Unspecified diastolic (congestive) heart failure: Secondary | ICD-10-CM

## 2014-11-18 DIAGNOSIS — I5033 Acute on chronic diastolic (congestive) heart failure: Secondary | ICD-10-CM | POA: Diagnosis not present

## 2014-11-18 DIAGNOSIS — K922 Gastrointestinal hemorrhage, unspecified: Secondary | ICD-10-CM | POA: Diagnosis present

## 2014-11-18 HISTORY — DX: Chronic kidney disease, stage 4 (severe): N18.4

## 2014-11-18 HISTORY — DX: Gastrointestinal hemorrhage, unspecified: K92.2

## 2014-11-18 HISTORY — DX: Other ascites: R18.8

## 2014-11-18 HISTORY — DX: Unspecified atrial fibrillation: I48.91

## 2014-11-18 LAB — URINALYSIS, ROUTINE W REFLEX MICROSCOPIC
BILIRUBIN URINE: NEGATIVE
BILIRUBIN URINE: NEGATIVE
GLUCOSE, UA: NEGATIVE mg/dL
Glucose, UA: NEGATIVE mg/dL
KETONES UR: NEGATIVE mg/dL
KETONES UR: NEGATIVE mg/dL
Nitrite: NEGATIVE
Nitrite: NEGATIVE
PH: 5 (ref 5.0–8.0)
PROTEIN: NEGATIVE mg/dL
Protein, ur: NEGATIVE mg/dL
SPECIFIC GRAVITY, URINE: 1.008 (ref 1.005–1.030)
Specific Gravity, Urine: 1.008 (ref 1.005–1.030)
UROBILINOGEN UA: 0.2 mg/dL (ref 0.0–1.0)
Urobilinogen, UA: 0.2 mg/dL (ref 0.0–1.0)
pH: 6 (ref 5.0–8.0)

## 2014-11-18 LAB — CBC WITH DIFFERENTIAL/PLATELET
BASOS ABS: 0 10*3/uL (ref 0.0–0.1)
BASOS PCT: 1 % (ref 0–1)
EOS ABS: 0.2 10*3/uL (ref 0.0–0.7)
EOS PCT: 4 % (ref 0–5)
HCT: 28.1 % — ABNORMAL LOW (ref 36.0–46.0)
Hemoglobin: 9.2 g/dL — ABNORMAL LOW (ref 12.0–15.0)
Lymphocytes Relative: 23 % (ref 12–46)
Lymphs Abs: 1.2 10*3/uL (ref 0.7–4.0)
MCH: 30.7 pg (ref 26.0–34.0)
MCHC: 32.7 g/dL (ref 30.0–36.0)
MCV: 93.7 fL (ref 78.0–100.0)
MONO ABS: 0.7 10*3/uL (ref 0.1–1.0)
Monocytes Relative: 14 % — ABNORMAL HIGH (ref 3–12)
Neutro Abs: 3.1 10*3/uL (ref 1.7–7.7)
Neutrophils Relative %: 58 % (ref 43–77)
PLATELETS: 168 10*3/uL (ref 150–400)
RBC: 3 MIL/uL — ABNORMAL LOW (ref 3.87–5.11)
RDW: 17.7 % — AB (ref 11.5–15.5)
WBC: 5.3 10*3/uL (ref 4.0–10.5)

## 2014-11-18 LAB — URINE MICROSCOPIC-ADD ON

## 2014-11-18 LAB — BASIC METABOLIC PANEL
ANION GAP: 14 (ref 5–15)
BUN: 103 mg/dL — ABNORMAL HIGH (ref 6–20)
CALCIUM: 8.8 mg/dL — AB (ref 8.9–10.3)
CO2: 21 mmol/L — ABNORMAL LOW (ref 22–32)
Chloride: 106 mmol/L (ref 101–111)
Creatinine, Ser: 2.54 mg/dL — ABNORMAL HIGH (ref 0.44–1.00)
GFR, EST AFRICAN AMERICAN: 18 mL/min — AB (ref >60)
GFR, EST NON AFRICAN AMERICAN: 15 mL/min — AB (ref >60)
Glucose, Bld: 106 mg/dL — ABNORMAL HIGH (ref 65–99)
Potassium: 4.3 mmol/L (ref 3.5–5.1)
SODIUM: 141 mmol/L (ref 135–145)

## 2014-11-18 LAB — PROTIME-INR
INR: 1.29 (ref 0.00–1.49)
PROTHROMBIN TIME: 16.3 s — AB (ref 11.6–15.2)

## 2014-11-18 LAB — APTT: aPTT: 46 seconds — ABNORMAL HIGH (ref 24–37)

## 2014-11-18 LAB — BRAIN NATRIURETIC PEPTIDE: B Natriuretic Peptide: 1066.5 pg/mL — ABNORMAL HIGH (ref 0.0–100.0)

## 2014-11-18 MED ORDER — SODIUM CHLORIDE 0.9 % IJ SOLN: 3.0000 mL | INTRAMUSCULAR | Status: DC | PRN

## 2014-11-18 MED ORDER — ONDANSETRON HCL 4 MG/2ML IJ SOLN: 4.0000 mg | Freq: Four times a day (QID) | INTRAMUSCULAR | Status: DC | PRN

## 2014-11-18 MED ORDER — ISOSORBIDE DINITRATE 20 MG PO TABS
20.0000 mg | ORAL_TABLET | Freq: Three times a day (TID) | ORAL | Status: DC
  Administered 2014-11-19 – 2014-11-23 (×14): 20 mg via ORAL
  Filled 2014-11-18 (×17): qty 1

## 2014-11-18 MED ORDER — VITAMIN C 500 MG PO TABS
500.0000 mg | ORAL_TABLET | Freq: Two times a day (BID) | ORAL | Status: DC
  Administered 2014-11-19 – 2014-11-23 (×10): 500 mg via ORAL
  Filled 2014-11-18 (×14): qty 1

## 2014-11-18 MED ORDER — PRO-STAT SUGAR FREE PO LIQD
30.0000 mL | Freq: Every day | ORAL | Status: DC
  Administered 2014-11-19 – 2014-11-22 (×5): 30 mL via ORAL
  Filled 2014-11-18 (×6): qty 30

## 2014-11-18 MED ORDER — POLYETHYLENE GLYCOL 3350 17 G PO PACK
17.0000 g | PACK | Freq: Every day | ORAL | Status: DC
  Administered 2014-11-19 – 2014-11-22 (×4): 17 g via ORAL
  Filled 2014-11-18 (×5): qty 1

## 2014-11-18 MED ORDER — PANTOPRAZOLE SODIUM 40 MG PO TBEC
40.0000 mg | DELAYED_RELEASE_TABLET | Freq: Every day | ORAL | Status: DC
  Administered 2014-11-19 – 2014-11-22 (×4): 40 mg via ORAL
  Filled 2014-11-18 (×3): qty 1

## 2014-11-18 MED ORDER — FUROSEMIDE 10 MG/ML IJ SOLN
80.0000 mg | Freq: Once | INTRAMUSCULAR | Status: AC
  Administered 2014-11-18: 80 mg via INTRAVENOUS
  Filled 2014-11-18: qty 8

## 2014-11-18 MED ORDER — SODIUM CHLORIDE 0.9 % IJ SOLN
3.0000 mL | Freq: Two times a day (BID) | INTRAMUSCULAR | Status: DC
  Administered 2014-11-19 – 2014-11-22 (×9): 3 mL via INTRAVENOUS

## 2014-11-18 MED ORDER — ACETAMINOPHEN 325 MG PO TABS: 650.0000 mg | ORAL_TABLET | Freq: Four times a day (QID) | ORAL | Status: DC | PRN

## 2014-11-18 MED ORDER — SODIUM CHLORIDE 0.9 % IV SOLN
250.0000 mL | INTRAVENOUS | Status: DC | PRN
Start: 2014-11-18 — End: 2014-11-23

## 2014-11-18 MED ORDER — HYDROCODONE-ACETAMINOPHEN 5-325 MG PO TABS: 1.0000 | ORAL_TABLET | Freq: Four times a day (QID) | ORAL | Status: DC | PRN

## 2014-11-18 MED ORDER — FUROSEMIDE 10 MG/ML IJ SOLN
80.0000 mg | Freq: Three times a day (TID) | INTRAMUSCULAR | Status: DC
  Administered 2014-11-19 – 2014-11-23 (×14): 80 mg via INTRAVENOUS
  Filled 2014-11-18 (×25): qty 8

## 2014-11-18 MED ORDER — FUROSEMIDE 10 MG/ML IJ SOLN
80.0000 mg | Freq: Once | INTRAMUSCULAR | Status: DC
  Filled 2014-11-18: qty 8

## 2014-11-18 MED ORDER — HEPARIN SODIUM (PORCINE) 5000 UNIT/ML IJ SOLN
5000.0000 [IU] | Freq: Three times a day (TID) | INTRAMUSCULAR | Status: DC
  Administered 2014-11-19 – 2014-11-23 (×14): 5000 [IU] via SUBCUTANEOUS
  Filled 2014-11-18 (×25): qty 1

## 2014-11-18 MED ORDER — ACETAMINOPHEN 325 MG PO TABS: 650.0000 mg | ORAL_TABLET | ORAL | Status: DC | PRN

## 2014-11-18 NOTE — Progress Notes (Signed)
Unit CM UR Completed by [facility] ED CM  W. Colum Colt RN  

## 2014-11-18 NOTE — ED Notes (Addendum)
Patient comfort and hygiene ensured with incontinence cleaner and new sheets, blankets, incontinence pad, and positioning on the left side by this nurse and Amy, NT.  Foley placed for close output monitoring.  Heels floated.

## 2014-11-18 NOTE — ED Notes (Signed)
Per EMS: patient sent from rehab facility for generalized edema x 3 weeks.  Has been on lasix 80 mg TID but this has not helped.  Advised to come here by physician.  Patient denies pain and shortness of breath.  VSS, alert and oriented.

## 2014-11-18 NOTE — ED Provider Notes (Signed)
CSN: [id number]     Arrival date & time 11/18/14  1651 History   First MD Initiated Contact with Patient 11/18/14 1659     Chief Complaint  Patient presents with  . Leg Swelling    patient has generalized edema     (Consider location/radiation/quality/duration/timing/severity/associated sxs/prior Treatment) HPI Comments: Patient is a 81 year old female with past medical history of hypertension, CK D stage III, tricuspid regurgitation with severe pulmonary hypertension who presents today from rehabilitation facility for concern for worsening edema. Primary doctor was concerned that the patient has had worsening edema in her lower extremities and is now extended up into her lower abdomen. Patient generally weighs around 160-170 pounds and daughter relates she now weighs around 185. Patient denies any recent fevers, chills, nausea, diarrhea, chest pain, shortness of breath. Daughter relates that she is noted that the edema is weeping. Patient denies any pain in her legs. The relates that she is currently in rehabilitation and working on being able to ambulate. Patient is currently on Lasix 80 mg 3 times daily for her chronic edema. Review of her last echo shows that she has preserved ejection fraction. Patient relates she has not been urinating less or had any dysuria or hematuria recently. She has no further complaints. She denies any worsening or alleviating factors associated with today's illness.  The history is provided by the patient, a relative and medical records. No language interpreter was used.    Past Medical History  Diagnosis Date  . Hypertension   . Moderate aortic stenosis 12/2013  . Moderate to severe pulmonary hypertension   . Arthritis     knee pain   . Bradycardia     a. 03/2014: HR 30's, sinus pauses and Wenkebach - BB discontinued.  . Valvular heart disease 2006    severe tricuspid regurge, moderate aortic insufficiency, mild mitral regurge  . Chronic diastolic CHF  (congestive heart failure)   . Anemia 2005    Normocytic  . Osteoporosis 2005  . Pericarditis 03/2004  . Atrial fibrillation   . GIB (gastrointestinal bleeding)   . Chronic kidney disease, stage IV (severe)     Hattie Perch 11/18/2014  . Ascites     hx/notes 11/18/2014   Past Surgical History  Procedure Laterality Date  . Varicose vein surgery Right   . Cataract extraction, bilateral Bilateral   . Inguinal lymph node biopsy Right 08/2011    non cancerous benign lymphoid tissue   Family History  Problem Relation Age of Onset  . Hypertension    . CAD Sister   . Hypertension Brother   . Diabetes type II Other   . Heart failure Daughter   . Breast cancer Daughter    Social History  Substance Use Topics  . Smoking status: Never Smoker   . Smokeless tobacco: Never Used  . Alcohol Use: No   OB History    No data available     Review of Systems  Constitutional: Negative for fever and chills.  HENT: Negative for congestion and rhinorrhea.   Eyes: Negative for photophobia and visual disturbance.  Respiratory: Negative for shortness of breath and wheezing.   Cardiovascular: Positive for leg swelling (bilateral lower extremitie swelling up to abdomen (lower)). Negative for chest pain.  Gastrointestinal: Negative for nausea, vomiting, abdominal pain and diarrhea.  Genitourinary: Negative for dysuria and difficulty urinating.  Musculoskeletal: Negative for arthralgias and gait problem.  Skin: Negative for color change and pallor.  Neurological: Negative for dizziness and light-headedness.  Psychiatric/Behavioral: Negative  for confusion and agitation.  All other systems reviewed and are negative.     Allergies  Asa  Home Medications   Prior to Admission medications   Medication Sig Start Date End Date Taking? Authorizing Provider  acetaminophen (TYLENOL) 500 MG tablet Take 1,000 mg by mouth every 6 (six) hours as needed for moderate pain (pain).    Yes Historical Provider, MD   Amino Acids-Protein Hydrolys (FEEDING SUPPLEMENT, PRO-STAT SUGAR FREE 64,) LIQD Take 30 mLs by mouth daily at 3 pm.   Yes Historical Provider, MD  furosemide (LASIX) 80 MG tablet Take 1 tablet (80 mg total) by mouth 3 (three) times daily. 07/12/14  Yes Janetta Hora, PA-C  HYDROcodone-acetaminophen (NORCO/VICODIN) 5-325 MG per tablet Take 1 tablet by mouth every 6 (six) hours as needed for moderate pain. 09/27/14  Yes Shanker Levora Dredge, MD  isosorbide dinitrate (ISORDIL) 20 MG tablet Take 1 tablet (20 mg total) by mouth 3 (three) times daily. 03/22/14  Yes Brittainy Sherlynn Carbon, PA-C  metolazone (ZAROXOLYN) 5 MG tablet Take 5 mg by mouth 3 (three) times a week. Take on Mon, Wed, and Frid   Yes Historical Provider, MD  Multiple Vitamins-Minerals (ONE-A-DAY 50 PLUS PO) Take 1 tablet by mouth daily at 3 pm.   Yes Historical Provider, MD  pantoprazole (PROTONIX) 40 MG tablet Take 1 tablet (40 mg total) by mouth daily at 12 noon. 09/27/14  Yes Shanker Levora Dredge, MD  polyethylene glycol (MIRALAX / GLYCOLAX) packet Take 17 g by mouth daily. 09/27/14  Yes Shanker Levora Dredge, MD  potassium chloride SA (K-DUR,KLOR-CON) 20 MEQ tablet Take 1 tablet (20 mEq total) by mouth daily. 07/19/14  Yes Rosalio Macadamia, NP  vitamin C (ASCORBIC ACID) 500 MG tablet Take 500 mg by mouth 2 (two) times daily.   Yes Historical Provider, MD   BP 123/45 mmHg  Pulse 77  Temp(Src) 98.4 F (36.9 C) (Oral)  Resp 18  Ht 5\' 4"  (1.626 m)  Wt 192 lb 7.4 oz (87.3 kg)  BMI 33.02 kg/m2  SpO2 99% Physical Exam  Constitutional: She is oriented to person, place, and time. No distress.  Elderly and frail  HENT:  Head: Normocephalic and atraumatic.  Eyes: Conjunctivae and EOM are normal.  Neck: Normal range of motion. Neck supple.  Cardiovascular: Normal rate and regular rhythm.   Murmur (3/6 systolic murmur) heard. Pulmonary/Chest: Effort normal and breath sounds normal.  Abdominal: Soft. Bowel sounds are normal. There is no  tenderness. There is no rebound and no guarding.  Musculoskeletal: Normal range of motion. She exhibits edema (bilateral lower extremities up to lower abdomen). She exhibits no tenderness.  Neurological: She is alert and oriented to person, place, and time.  Skin: Skin is warm and dry. She is not diaphoretic.  Psychiatric: Her behavior is normal.  Nursing note and vitals reviewed.   ED Course  Procedures (including critical care time) Labs Review Labs Reviewed  CBC WITH DIFFERENTIAL/PLATELET - Abnormal; Notable for the following:    RBC 3.00 (*)    Hemoglobin 9.2 (*)    HCT 28.1 (*)    RDW 17.7 (*)    Monocytes Relative 14 (*)    All other components within normal limits  BASIC METABOLIC PANEL - Abnormal; Notable for the following:    CO2 21 (*)    Glucose, Bld 106 (*)    BUN 103 (*)    Creatinine, Ser 2.54 (*)    Calcium 8.8 (*)    GFR calc  non Af Amer 15 (*)    GFR calc Af Amer 18 (*)    All other components within normal limits  URINALYSIS, ROUTINE W REFLEX MICROSCOPIC (NOT AT [facility]) - Abnormal; Notable for the following:    APPearance TURBID (*)    Hgb urine dipstick MODERATE (*)    Leukocytes, UA LARGE (*)    All other components within normal limits  URINE MICROSCOPIC-ADD ON - Abnormal; Notable for the following:    Squamous Epithelial / LPF MANY (*)    Bacteria, UA FEW (*)    All other components within normal limits  BRAIN NATRIURETIC PEPTIDE - Abnormal; Notable for the following:    B Natriuretic Peptide 1066.5 (*)    All other components within normal limits  APTT - Abnormal; Notable for the following:    aPTT 46 (*)    All other components within normal limits  PROTIME-INR - Abnormal; Notable for the following:    Prothrombin Time 16.3 (*)    All other components within normal limits  URINALYSIS, ROUTINE W REFLEX MICROSCOPIC (NOT AT [facility]) - Abnormal; Notable for the following:    APPearance CLOUDY (*)    Hgb urine dipstick SMALL (*)    Leukocytes, UA LARGE  (*)    All other components within normal limits  URINE MICROSCOPIC-ADD ON - Abnormal; Notable for the following:    Squamous Epithelial / LPF MANY (*)    All other components within normal limits  MRSA PCR SCREENING  MAGNESIUM  HEMOGLOBIN A1C  LIPID PANEL  BASIC METABOLIC PANEL    Imaging Review Dg Chest Port 1 View  11/18/2014   CLINICAL DATA:  Lower extremity edema for 3 weeks  EXAM: PORTABLE CHEST - 1 VIEW  COMPARISON:  September 25, 2014  FINDINGS: There is stable cardiomegaly. There is less edema compared to prior study. Currently there is trace interstitial edema. There is a minimal right effusion. There is no airspace consolidation. No adenopathy. There is atherosclerotic change in aorta. There are small cervical ribs bilaterally.  IMPRESSION: There is a degree of congestive heart failure, although there is significantly less edema compared to prior study. No airspace consolidation. No adenopathy.   Electronically Signed   By: Bretta Bang III M.D.   On: 11/18/2014 21:34   I have personally reviewed and evaluated these images and lab results as part of my medical decision-making.   EKG Interpretation   Date/Time:  Thursday November 18 2014 16:51:58 EDT Ventricular Rate:  81 PR Interval:    QRS Duration: 82 QT Interval:  393 QTC Calculation: 456 R Axis:   8 Text Interpretation:  Atrial fibrillation Consider left ventricular  hypertrophy Anterior Q waves, possibly due to LVH narrow complex regular  rhythm, but substantial artefact Left ventricular hypertrophy Abnormal ekg  Confirmed by Gerhard Munch  MD [id number]) on 11/18/2014 7:50:28 PM      MDM   Final diagnoses:  CHF (congestive heart failure)  Pitting edema    Patient is a 81 year old female with past medical history of hypertension, CK D stage III, tricuspid regurgitation with severe pulmonary hypertension who presents today from rehabilitation facility for concern for worsening edema. Findings today concerning for  worsening edema likely secondary to tricuspid regurgitation and pulmonary hypertension. Patient is not responding well to her home dosage of Lasix. [place] the patient to the hospitalist service and patient was given Lasix 80 mg IV. Foley catheter was inserted and on reevaluation patient was notably producing up to 250 mL. She seemed  to be responding well to her Lasix dose. Patient and family are agreeable with plan to admit for further monitoring and treatment. Patient was stable the time of transfer.    Madolyn Frieze, MD 11/19/14 1610  Gerhard Munch, MD 11/22/14 [phone]

## 2014-11-18 NOTE — ED Notes (Signed)
Unable to collect specimens with IV start.  Phlebotomy notified.

## 2014-11-18 NOTE — ED Notes (Signed)
Pt repositioned to left side by Feliz Beam RN and I

## 2014-11-18 NOTE — H&P (Signed)
[facility] History and Physical  Meghan Russo AYT:[id number] DOB: 04/12/1920 DOA: 11/18/2014  Referring physician: ED physician PCP: [name], MD  Specialists:   Chief Complaint: Worsening leg edema  HPI: Meghan Russo is a 81 y.o. female with PMH of hypertension, dCHF (EF 55 to 60%), GIB, Afib not on anticoagulation to due to hx of GI bleeding, arthritis, anemia, pericarditis, ascites, CKD stage IV, tricuspid regurgitation, severe pulmonary hypertension, who presents with worsening leg edema.  Patient is accompanied by her daughter, who states that patient's bilateral leg edema has been worsening recently. Body weight has increased by nearly 15 pounds. Leg edema has been extending into her abdomen. Daughter states that patient is taking Lasix 80 mg 3 times a day and metolazone. Patient denies chest pain, shortness of breath, abdominal pain, diarrhea, dysuria, burning on urination, unilateral weakness. Since patient is taking Lasix, not sure whether the patient has increased urinary frequency.  In ED, patient was found to have WBC 5.3, temperature normal, stable renal function. Urinalysis is positive, but urine collection is dirty catch with many squamous cells. Patient is admitted to inpatient for further evaluation and treatment.  Where does patient live?   SNF  Can patient participate in ADLs?  None  Review of Systems:   General: no fevers, chills, no changes in body weight, has poor appetite, has fatigue HEENT: no blurry vision, hearing changes or sore throat Pulm: no dyspnea, coughing, wheezing CV: no chest pain, palpitations Abd: no nausea, vomiting, abdominal pain, diarrhea, constipation GU: no dysuria, burning on urination, increased urinary frequency, hematuria  Ext: has leg edema Neuro: no unilateral weakness, numbness, or tingling, no vision change or hearing loss Skin: no rash MSK: No muscle spasm, no deformity, no limitation of range of movement in spin Heme:  No easy bruising.  Travel history: No recent long distant travel.  Allergy:  Allergies  Allergen Reactions  . Asa [Aspirin] Other (See Comments)    GI Upset    Past Medical History  Diagnosis Date  . Hypertension   . Chronic kidney disease, stage III (moderate) 2013  . Moderate aortic stenosis 12/2013  . Moderate to severe pulmonary hypertension   . Arthritis     knee pain   . Cataract   . Bradycardia     a. 03/2014: HR 30's, sinus pauses and Wenkebach - BB discontinued.  . Valvular heart disease 2006    severe tricuspid regurge, moderate aortic insufficiency, mild mitral regurge  . Chronic diastolic CHF (congestive heart failure)   . Anemia 2005    Normocytic  . Osteoporosis 2005  . Pericarditis 03/2004  . Atrial fibrillation   . GIB (gastrointestinal bleeding)     Past Surgical History  Procedure Laterality Date  . Vericose vein surgery    . Cataract extraction    . Inguinal lymph node biopsy Right 08/2011    non cancerous benign lymphoid tissue    Social History:  reports that she has never smoked. She has never used smokeless tobacco. She reports that she does not drink alcohol or use illicit drugs.  Family History:  Family History  Problem Relation Age of Onset  . Hypertension    . CAD Sister   . Hypertension Brother   . Diabetes type II Other   . Heart failure Daughter   . Breast cancer Daughter      Prior to Admission medications   Medication Sig Start Date End Date Taking? Authorizing Provider  acetaminophen (TYLENOL) 500 MG tablet  Take 1,000 mg by mouth every 6 (six) hours as needed for moderate pain (pain).    Yes Historical Provider, MD  Amino Acids-Protein Hydrolys (FEEDING SUPPLEMENT, PRO-STAT SUGAR FREE 64,) LIQD Take 30 mLs by mouth daily at 3 pm.   Yes Historical Provider, MD  furosemide (LASIX) 80 MG tablet Take 1 tablet (80 mg total) by mouth 3 (three) times daily. 07/12/14  Yes Janetta Hora, PA-C  HYDROcodone-acetaminophen  (NORCO/VICODIN) 5-325 MG per tablet Take 1 tablet by mouth every 6 (six) hours as needed for moderate pain. 09/27/14  Yes Shanker Levora Dredge, MD  isosorbide dinitrate (ISORDIL) 20 MG tablet Take 1 tablet (20 mg total) by mouth 3 (three) times daily. 03/22/14  Yes Brittainy Sherlynn Carbon, PA-C  metolazone (ZAROXOLYN) 5 MG tablet Take 5 mg by mouth 3 (three) times a week. Take on Mon, Wed, and Frid   Yes Historical Provider, MD  Multiple Vitamins-Minerals (ONE-A-DAY 50 PLUS PO) Take 1 tablet by mouth daily at 3 pm.   Yes Historical Provider, MD  pantoprazole (PROTONIX) 40 MG tablet Take 1 tablet (40 mg total) by mouth daily at 12 noon. 09/27/14  Yes Shanker Levora Dredge, MD  polyethylene glycol (MIRALAX / GLYCOLAX) packet Take 17 g by mouth daily. 09/27/14  Yes Shanker Levora Dredge, MD  potassium chloride SA (K-DUR,KLOR-CON) 20 MEQ tablet Take 1 tablet (20 mEq total) by mouth daily. 07/19/14  Yes Rosalio Macadamia, NP  vitamin C (ASCORBIC ACID) 500 MG tablet Take 500 mg by mouth 2 (two) times daily.   Yes Historical Provider, MD    Physical Exam: Filed Vitals:   11/18/14 1730 11/18/14 1745 11/18/14 1800 11/18/14 1815  BP: 129/48 133/49 131/51 146/57  Pulse: 81 71 73 73  Temp:      TempSrc:      Resp: 20 24 23 19   Height:      Weight:      SpO2: 98% 98% 99% 98%   General: Not in acute distress. has anasarca HEENT:       Eyes: PERRL, EOMI, no scleral icterus.       ENT: No discharge from the ears and nose, no pharynx injection, no tonsillar enlargement.        Neck: positive JVD, no bruit, no mass felt. Heme: No neck lymph node enlargement. Cardiac: S1/S2, RRR, No murmurs, No gallops or rubs. Pulm: has rales bilterally, no wheezing or rubs. Abd: Soft, distended, nontender, no rebound pain, no organomegaly, BS present. Ext: 3+ pitting leg edema bilaterally. 2+DP/PT pulse bilaterally. Musculoskeletal: No joint deformities, No joint redness or warmth, no limitation of ROM in spin. Skin: No rashes. Has stage  II sacral ulcers Neuro: Alert, oriented X3, cranial nerves II-XII grossly intact, moves all extremities. Psych: Patient is not psychotic, no suicidal or hemocidal ideation.  Labs on Admission:  Basic Metabolic Panel:  Recent Labs Lab 11/18/14 1907  NA 141  K 4.3  CL 106  CO2 21*  GLUCOSE 106*  BUN 103*  CREATININE 2.54*  CALCIUM 8.8*   Liver Function Tests: No results for input(s): AST, ALT, ALKPHOS, BILITOT, PROT, ALBUMIN in the last 168 hours. No results for input(s): LIPASE, AMYLASE in the last 168 hours. No results for input(s): AMMONIA in the last 168 hours. CBC:  Recent Labs Lab 11/18/14 1907  WBC 5.3  NEUTROABS 3.1  HGB 9.2*  HCT 28.1*  MCV 93.7  PLT 168   Cardiac Enzymes: No results for input(s): CKTOTAL, CKMB, CKMBINDEX, TROPONINI in the last  168 hours.  BNP (last 3 results)  Recent Labs  06/28/14 1840  BNP 1593.3*    ProBNP (last 3 results)  Recent Labs  03/16/14 1400 03/18/14 0428 07/19/14 1153  PROBNP 19829.0* 20357.0* 768.0*    CBG: No results for input(s): GLUCAP in the last 168 hours.  Radiological Exams on Admission: No results found.  EKG: Independently reviewed.  Abnormal findings:  Atrial fibrillation, T-wave flattening, LVH Assessment/Plan Principal Problem:   Acute on chronic diastolic heart failure Active Problems:   Moderate aortic stenosis   Bilateral leg edema   Anemia   History of rheumatic heart disease   Moderate to severe pulmonary hypertension   Severe tricuspid regurgitation   Chronic diastolic CHF (congestive heart failure)   Hypertension   CKD (chronic kidney disease) stage 4, GFR 15-29 ml/min   Anasarca   Lower GI bleed   Pitting edema   CHF exacerbation   Decubitus ulcer of sacral region, stage 2   GERD (gastroesophageal reflux disease)   Ascites  Acute on chronic diastolic heart failure, pulmonary hypertension and right heart failure: He has anasarca. She failed oral high-dose oral diuretics  treatment.  -Will admit to tele bed -start IV lasix 80 mg bid -will cycle CE X3 -will check  A1c, FLP -will get 2-D echo to evaluate EF -strict In/Out with Foley placement -Daily body weight. -heart diet -No on ACEI because of CKD -Not on ASA since patient is allergic to ASA -Not on BB, will add one after CHF exacerbation resolves -check BNP and CXR  HTN: -On IV lasix and Isordil  AoCKD-IV: Baseline Cre is 2.1-2.6, her Cre is 2.54  on admission which is close to baseline. - Follow up renal function by BMP - Avoid ACEI and NSAIDs  Decubitus ulcer of sacral region, stage 2: -Wound care consult  GERD: -Protonix  Ascites: seen on previous CT Abd. Suspected cardiogenic. No abdominal pain. Currently abd is not tense-no indication for paracentesis at this time.  -continue Lasix IV as above   DVT ppx: SQ Heparin   Code Status: Full code Family Communication:  Yes, patient's daughter  at bed side Disposition Plan: Admit to inpatient   Date of Service 11/18/2014    Lorretta Harp [facility] Pager [phone]  If 7PM-7AM, please contact night-coverage [email or web address] Password [facility] 11/18/2014, 8:59 PM

## 2014-11-19 ENCOUNTER — Inpatient Hospital Stay (HOSPITAL_COMMUNITY): Payer: Medicare Other

## 2014-11-19 DIAGNOSIS — Z7189 Other specified counseling: Secondary | ICD-10-CM

## 2014-11-19 DIAGNOSIS — R188 Other ascites: Secondary | ICD-10-CM

## 2014-11-19 DIAGNOSIS — R609 Edema, unspecified: Secondary | ICD-10-CM

## 2014-11-19 DIAGNOSIS — Z515 Encounter for palliative care: Secondary | ICD-10-CM | POA: Insufficient documentation

## 2014-11-19 DIAGNOSIS — L89152 Pressure ulcer of sacral region, stage 2: Secondary | ICD-10-CM

## 2014-11-19 LAB — BASIC METABOLIC PANEL
Anion gap: 13 (ref 5–15)
BUN: 102 mg/dL — AB (ref 6–20)
CO2: 23 mmol/L (ref 22–32)
CREATININE: 2.48 mg/dL — AB (ref 0.44–1.00)
Calcium: 8.6 mg/dL — ABNORMAL LOW (ref 8.9–10.3)
Chloride: 104 mmol/L (ref 101–111)
GFR, EST AFRICAN AMERICAN: 18 mL/min — AB (ref >60)
GFR, EST NON AFRICAN AMERICAN: 16 mL/min — AB (ref >60)
Glucose, Bld: 111 mg/dL — ABNORMAL HIGH (ref 65–99)
Potassium: 4.1 mmol/L (ref 3.5–5.1)
SODIUM: 140 mmol/L (ref 135–145)

## 2014-11-19 LAB — LIPID PANEL
CHOL/HDL RATIO: 2.4 ratio
Cholesterol: 70 mg/dL (ref 0–200)
HDL: 29 mg/dL — AB (ref >40)
LDL CALC: 36 mg/dL (ref 0–99)
Triglycerides: 25 mg/dL (ref <150)
VLDL: 5 mg/dL (ref 0–40)

## 2014-11-19 LAB — MRSA PCR SCREENING: MRSA BY PCR: POSITIVE — AB

## 2014-11-19 LAB — MAGNESIUM: MAGNESIUM: 2 mg/dL (ref 1.7–2.4)

## 2014-11-19 MED ORDER — POTASSIUM CHLORIDE CRYS ER 20 MEQ PO TBCR
20.0000 meq | EXTENDED_RELEASE_TABLET | Freq: Every day | ORAL | Status: DC
  Administered 2014-11-19 – 2014-11-23 (×5): 20 meq via ORAL
  Filled 2014-11-19 (×6): qty 1

## 2014-11-19 MED ORDER — MUPIROCIN 2 % EX OINT
1.0000 "application " | TOPICAL_OINTMENT | Freq: Two times a day (BID) | CUTANEOUS | Status: DC
  Administered 2014-11-19 – 2014-11-23 (×9): 1 via NASAL
  Filled 2014-11-19 (×2): qty 22

## 2014-11-19 MED ORDER — HYDROCERIN EX CREA
TOPICAL_CREAM | Freq: Two times a day (BID) | CUTANEOUS | Status: DC
  Administered 2014-11-19 – 2014-11-22 (×7): via TOPICAL
  Administered 2014-11-23: 1 via TOPICAL
  Filled 2014-11-19 (×2): qty 113

## 2014-11-19 MED ORDER — CHLORHEXIDINE GLUCONATE CLOTH 2 % EX PADS
6.0000 | MEDICATED_PAD | Freq: Every day | CUTANEOUS | Status: AC
  Administered 2014-11-19 – 2014-11-23 (×5): 6 via TOPICAL

## 2014-11-19 NOTE — Clinical Social Work Note (Signed)
Clinical Social Work Assessment  Patient Details  Name: Meghan Russo MRN: [id number] Date of Birth: 07-11-1920  Date of referral:  11/19/14               Reason for consult:  Facility Placement                Permission sought to share information with:  Oceanographer granted to share information::  Yes, Verbal Permission Granted  Name::        Agency::  [facility]  Relationship::     Contact Information:     Housing/Transportation Living arrangements for the past 2 months:  Skilled Building surveyor of Information:  Patient, Adult Children Patient Interpreter Needed:  None Criminal Activity/Legal Involvement Pertinent to Current Situation/Hospitalization:  No - Comment as needed Significant Relationships:  Adult Children, Other Family Members Lives with:  Facility Resident Do you feel safe going back to the place where you live?  No Need for family participation in patient care:  Yes (Comment) (Pt request)  Care giving concerns:  CSW notified pt admitted from facility   Social Worker assessment / plan:  CSW visited pt room and spoke with pt and multiple family members at bedside. Pt admitted from [facility] where she reports being for 3 weeks for ST rehab and not long term placement. Pt and pt family informed CSW do not want to return to facility and would like to look at option of [address] or [place]. At this time, family is not agreeable to referral being sent to all [facility] and would like to hear from two preferred facilities before looking at other options.  CSW did speak with palliative team member and was notified that pt and family are currently unsure of long term plan at this time. CSW will discuss this further with family during next visit in regards to insurance coverage/Medicaid etc...  Employment status:  Retired Database administrator PT Recommendations:  Skilled Nursing  Facility Information / Referral to community resources:  Skilled Nursing Facility  Patient/Family's Response to care:  Pt family wanting pt to dc to new SNF  Patient/Family's Understanding of and Emotional Response to Diagnosis, Current Treatment, and Prognosis:  Pt participation limited in conversation so unable to assess understanding. Pt family has limited but good understanding of pt condition. Feel that palliative care discussion may have provided more insight on pt condition this admission.  Emotional Assessment Appearance:  Appears stated age, Well-Groomed Attitude/Demeanor/Rapport:  Other (Cooperative) Affect (typically observed):  Quiet, Calm Orientation:  Oriented to Self, Oriented to Place, Oriented to  Time, Oriented to Situation Alcohol / Substance use:  Not Applicable Psych involvement (Current and /or in the community):  No (Comment)  Discharge Needs  Concerns to be addressed:  Discharge Planning Concerns Readmission within the last 30 days:  No Current discharge risk:  Dependent with Mobility Barriers to Discharge:  Continued Medical Work up   [employer] [phone]

## 2014-11-19 NOTE — Evaluation (Signed)
Occupational Therapy Evaluation Patient Details Name: NEVA RAMASWAMY MRN: [id number] DOB: [birth year] Today's Date: 11/19/2014    History of Present Illness 81 yo female who is SNF resident was admitted for worsening LE edema, CHF acute on chronic.  Hx of tricuspid regurg, CKD 3, CHF, A-fib.     Clinical Impression   Pt is a resident of SNF and dependent at baseline in bathing, dressing and toileting.  She self feeds. No current acute OT needs.  Will defer to SNF.    Follow Up Recommendations  SNF;Supervision/Assistance - 24 hour    Equipment Recommendations       Recommendations for Other Services       Precautions / Restrictions Precautions Precautions: Fall Precaution Comments: sensitivity on LE's due to skin breakdown from reducing edema Restrictions Weight Bearing Restrictions: No      Mobility Bed Mobility Overal bed mobility: Needs Assistance;+2 for physical assistance;+ 2 for safety/equipment Bed Mobility: Supine to Sit;Rolling Rolling: Mod assist;+2 for physical assistance;+2 for safety/equipment   Supine to sit: Mod assist;+2 for physical assistance;+2 for safety/equipment;HOB elevated     General bed mobility comments: cues to reach for bedrail and to sequence  Transfers Overall transfer level: Needs assistance Equipment used: Rolling walker (2 wheeled);2 person hand held assist Transfers: Sit to/from Stand Sit to Stand: Mod assist;+2 physical assistance;+2 safety/equipment;From elevated surface         General transfer comment: reminders for hand placement and to control her balance upon initial standing, 2 person assist for keeping feet from sliding on floor    Balance Overall balance assessment: Needs assistance Sitting-balance support: Feet supported;Bilateral upper extremity supported Sitting balance-Leahy Scale: Fair   Postural control: Posterior lean Standing balance support: Bilateral upper extremity supported Standing balance-Leahy Scale:  Poor Standing balance comment: dense cues for controlling standing as pt tends to just let feel slide out in front of her                            ADL Overall ADL's : At baseline                                             Vision     Perception     Praxis      Pertinent Vitals/Pain Pain Assessment: Faces Faces Pain Scale: Hurts even more Pain Location: B LEs Pain Descriptors / Indicators: Grimacing Pain Intervention(s): Limited activity within patient's tolerance;Monitored during session;Repositioned     Hand Dominance Right   Extremity/Trunk Assessment Upper Extremity Assessment Upper Extremity Assessment: RUE deficits/detail;LUE deficits/detail RUE Deficits / Details: generalized weakness, longstanding shoulder ROM limitations RUE Coordination: decreased gross motor LUE Deficits / Details: generalized weakness, edema in upper arm, longstanding shoulder ROM limitations LUE Coordination: decreased gross motor   Lower Extremity Assessment Lower Extremity Assessment: Defer to PT evaluation   Cervical / Trunk Assessment Cervical / Trunk Assessment: Kyphotic   Communication Communication Communication: HOH   Cognition Arousal/Alertness: Awake/alert Behavior During Therapy: WFL for tasks assessed/performed Overall Cognitive Status: No family/caregiver present to determine baseline cognitive functioning       Memory: Decreased recall of precautions;Decreased short-term memory             General Comments       Exercises       Shoulder Instructions  Home Living Family/patient expects to be discharged to:: Skilled nursing facility                                        Prior Functioning/Environment Level of Independence: Needs assistance  Gait / Transfers Assistance Needed: walks about 3' at SNF with RW with assist ADL's / Homemaking Assistance Needed: pt can self feed, SNF staff assist "a lot" with  bathing, dressing and toileting per pt        OT Diagnosis: Generalized weakness;Cognitive deficits;Acute pain   OT Problem List:     OT Treatment/Interventions:      OT Goals(Current goals can be found in the care plan section) Acute Rehab OT Goals Patient Stated Goal: to try to walk  OT Frequency:     Barriers to D/C:            Co-evaluation              End of Session Equipment Utilized During Treatment: Gait belt Nurse Communication: Mobility status  Activity Tolerance: Patient tolerated treatment well Patient left: in chair;with call bell/phone within reach;with nursing/sitter in room   Time: 1045-1109 OT Time Calculation (min): 24 min Charges:  OT General Charges $OT Visit: 1 Procedure OT Evaluation $Initial OT Evaluation Tier I: 1 Procedure G-Codes:    Evern Bio 11/19/2014, 12:33 PM  [phone]

## 2014-11-19 NOTE — Progress Notes (Signed)
ECG showed accelerated junctional at 76, no s/s, previous ECG was Afib, which she has a hx of, will continue to monitor, thanks Lavonda Jumbo RN

## 2014-11-19 NOTE — Progress Notes (Signed)
PATIENT DETAILS Name: Meghan Russo Age: 80 y.o. Sex: female Date of Birth: [birth year] Admit Date: 11/18/2014 Admitting Physician Lorretta Harp, MD ZOX:[id number],UJWJX N, MD  Subjective: Shortness of breath much better.  Assessment/Plan: Principal Problem: Acute on chronic diastolic heart failure with severe pulmonary hypertension: Although improved, continues to have significant volume overload. Suspect significant amount of right-sided heart failure. So far 1.5 L negative balance, weight reduced to 190 pounds  (discharge summary June 2016 last weight 143 pounds!). Continue 80 mg IV Lasix 3 times a day. Follow electrolytes.  Active Problems: Valvular heart disease: Has history of moderate AS/AI/MR and severe TR with pulmonary hypertension. Reviewed outpatient cardiology note-conservative treatment has been recommended.  Chronic kidney disease stage IV: Creatinine close to her usual baseline, watch closely while on intravenous Lasix.  Ascites: seen on previous CT Abd on 6/26. Suspected cardiac cirrhoses. Currently abd is not tense-no indication for paracentesis at this time. Continue Lasix IV as above-check LFTs-doubt given advanced age and frailty-further workup is required in the setting while inpatient.  Decubitus ulcer of sacral region, stage 2:Wound care consult-known as prior to this admission  Essential hypertension: Controlled with Imdur. Follow  Moderate to severe pulmonary hypertension: Secondary to valvular abnormalities. Outpatient monitoring.   History of Atrioventricular block, Mobitz type 1, Wenckebach: avoid beta blockers, calcium channel blockers  Palliative care: Elderly, very frail-with numerous valvular abnormalities and severe pulmonary hypertension-reviewed outpatient cardiology note-recommended conservative measures. Clearly not a candidate for aggressive care. She remains a full code. Spoke with patient's daughter Britta Mccreedy Lyles-explained poor overall  prognosis-have consulted palliative care.  Disposition: Remain inpatient-suspect SNF on discharge  Antimicrobial agents  See below  Anti-infectives    None      DVT Prophylaxis: ProphylacticHeparin  Code Status: Full code   Family Communication Barbara Lyles-daughter over the phone  Procedures: None  CONSULTS:  Palliative care  Time spent 40 minutes-Greater than 50% of this time was spent in counseling, explanation of diagnosis, planning of further management, and coordination of care.  MEDICATIONS: Scheduled Meds: . Chlorhexidine Gluconate Cloth  6 each Topical Q0600  . feeding supplement (PRO-STAT SUGAR FREE 64)  30 mL Oral Q1500  . furosemide  80 mg Intravenous Once  . furosemide  80 mg Intravenous 3 times per day  . heparin  5,000 Units Subcutaneous 3 times per day  . hydrocerin   Topical BID  . isosorbide dinitrate  20 mg Oral TID  . mupirocin ointment  1 application Nasal BID  . pantoprazole  40 mg Oral Q1200  . polyethylene glycol  17 g Oral Daily  . sodium chloride  3 mL Intravenous Q12H  . vitamin C  500 mg Oral BID   Continuous Infusions:  PRN Meds:.sodium chloride, acetaminophen, acetaminophen, HYDROcodone-acetaminophen, ondansetron (ZOFRAN) IV, sodium chloride    PHYSICAL EXAM: Vital signs in last 24 hours: Filed Vitals:   11/18/14 2115 11/18/14 2159 11/19/14 0000 11/19/14 0441  BP: 143/57 141/49 123/45 121/52  Pulse: 77 71 77 78  Temp:  98.1 F (36.7 C) 98.4 F (36.9 C) 99.1 F (37.3 C)  TempSrc:  Oral Oral Oral  Resp: 19 18 18 19   Height:  5\' 4"  (1.626 m)    Weight:  87.3 kg (192 lb 7.4 oz)  86.6 kg (190 lb 14.7 oz)  SpO2: 94% 92% 99% 98%    Weight change:  Filed Weights   11/18/14 1656 11/18/14 2159 11/19/14 0441  Weight: 84.369 kg (186 lb) 87.3 kg (192 lb 7.4 oz) 86.6 kg (190 lb 14.7 oz)   Body mass index is 32.75 kg/(m^2).   Gen Exam: Awake and alert with clear speech. Chronically sick looking. Neck: Supple, +JVD.   Chest:  B/L Clear.   CVS: S1 S2 Regular, no murmurs.  Abdomen: soft, BS +, non tender, distended but not tense Extremities: +++ edema, lower extremities warm to touch. Neurologic: Non Focal-but with gen weakness Skin: No Rash.    Intake/Output from previous day:  Intake/Output Summary (Last 24 hours) at 11/19/14 1150 Last data filed at 11/19/14 0700  Gross per 24 hour  Intake    240 ml  Output   1925 ml  Net  -1685 ml     LAB RESULTS: CBC  Recent Labs Lab 11/18/14 1907  WBC 5.3  HGB 9.2*  HCT 28.1*  PLT 168  MCV 93.7  MCH 30.7  MCHC 32.7  RDW 17.7*  LYMPHSABS 1.2  MONOABS 0.7  EOSABS 0.2  BASOSABS 0.0    Chemistries   Recent Labs Lab 11/18/14 1907 11/19/14 0326  NA 141 140  K 4.3 4.1  CL 106 104  CO2 21* 23  GLUCOSE 106* 111*  BUN 103* 102*  CREATININE 2.54* 2.48*  CALCIUM 8.8* 8.6*  MG  --  2.0    CBG: No results for input(s): GLUCAP in the last 168 hours.  GFR Estimated Creatinine Clearance: 15.4 mL/min (by C-G formula based on Cr of 2.48).  Coagulation profile  Recent Labs Lab 11/18/14 2128  INR 1.29    Cardiac Enzymes No results for input(s): CKMB, TROPONINI, MYOGLOBIN in the last 168 hours.  Invalid input(s): CK  Invalid input(s): POCBNP No results for input(s): DDIMER in the last 72 hours. No results for input(s): HGBA1C in the last 72 hours.  Recent Labs  11/19/14 0326  CHOL 70  HDL 29*  LDLCALC 36  TRIG 25  CHOLHDL 2.4   No results for input(s): TSH, T4TOTAL, T3FREE, THYROIDAB in the last 72 hours.  Invalid input(s): FREET3 No results for input(s): VITAMINB12, FOLATE, FERRITIN, TIBC, IRON, RETICCTPCT in the last 72 hours. No results for input(s): LIPASE, AMYLASE in the last 72 hours.  Urine Studies No results for input(s): UHGB, CRYS in the last 72 hours.  Invalid input(s): UACOL, UAPR, USPG, UPH, UTP, UGL, UKET, UBIL, UNIT, UROB, ULEU, UEPI, UWBC, URBC, UBAC, CAST, UCOM, BILUA  MICROBIOLOGY: Recent Results (from the  past 240 hour(s))  MRSA PCR Screening     Status: Abnormal   Collection Time: 11/18/14 11:00 PM  Result Value Ref Range Status   MRSA by PCR POSITIVE (A) NEGATIVE Final    Comment:        The GeneXpert MRSA Assay (FDA approved for NASAL specimens only), is one component of a comprehensive MRSA colonization surveillance program. It is not intended to diagnose MRSA infection nor to guide or monitor treatment for MRSA infections. RESULT CALLED TO, READ BACK BY AND VERIFIED WITH: R.HANEY,RN 0148 11/19/14 M.CAMPBELL     RADIOLOGY STUDIES/RESULTS: Dg Chest Port 1 View  11/18/2014   CLINICAL DATA:  Lower extremity edema for 3 weeks  EXAM: PORTABLE CHEST - 1 VIEW  COMPARISON:  September 25, 2014  FINDINGS: There is stable cardiomegaly. There is less edema compared to prior study. Currently there is trace interstitial edema. There is a minimal right effusion. There is no airspace consolidation. No adenopathy. There is atherosclerotic change in aorta. There are small cervical ribs bilaterally.  IMPRESSION: There is a degree of congestive heart failure, although there is significantly less edema compared to prior study. No airspace consolidation. No adenopathy.   Electronically Signed   By: Bretta Bang III M.D.   On: 11/18/2014 21:34    Jeoffrey Massed, MD  [facility] Pager:[phone]  If 7PM-7AM, please contact night-coverage [email or web address] Password [email or web address] 11/19/2014, 11:50 AM   LOS: 1 day

## 2014-11-19 NOTE — Consult Note (Signed)
Consultation Note Date: 11/19/2014   Patient Name: Meghan Russo  DOB: [birth year]  MRN: [id number]  Age / Sex: 81 y.o., female   PCP: Dorothyann Peng, MD Referring Physician: Maretta Bees, MD  Reason for Consultation: Establishing goals of care and Psychosocial/spiritual support  Palliative Care Assessment and Plan Summary of Established Goals of Care and Medical Treatment Preferences   Clinical Assessment/Narrative: Meghan Russo is resting quietly in bed, she makes and keeps eye contact.  Her two daughters, Meghan Russo and Meghan Russo enter during our conversation.  Meghan Russo agrees to have me talk with her daughters. We talk about HF and the complications of fluid overload.  We talk about reoccurances, and the likely hood that she will continue to have episodes of fluid overload (they state mostly in her thighs and abdomen).  We discuss the concept of do no re hospitalize, allow natural death, and Hospice.  I share a diagram of the chronic illness trajectory.  Meghan Russo becomes tearful a few times during our conversation.  We talk about Meghan Russo medications, her kidney function, and over all health and mobility; also reoccur ance of wounds on legs. Meghan Russo states that [name]'s goal is to return to her home, but Meghan Russo states she feels that she wont be able to help and this will be a burden.  Meghan Russo states she was walking with a walker last week.  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   Contacts/Participants in Discussion: Primary Decision Maker:  Meghan Russo is able to make her own decisions but leans on her daughters for help/guidance.    HCPOA: no  No completed paperwork and Meghan Russo declines to completed HCPOA today.  Daughters [place] and Meghan Russo are to make decisions.   Code Status/Advance Care Planning:  DNR  We discussed the concept of allow natural death, and Meghan Russo voices her concern regarding rib fractures with chest compressions.  They decide to change code status to DNR today.   Symptom Management:   Tylenol 650 mg PO/PR Q 4 hours PRN  Palliative Prophylaxis:  Recommend Senna-S 1 tab BID.   Psycho-social/Spiritual:   Support System: Lives in [facility] at this time, formerly lived in her home with daughter Meghan Russo and Meghan Russo.   Desire for further Chaplaincy support: no, declines today.   Prognosis: Unable to determine  Discharge Planning:  Skilled Nursing Facility for rehab with Palliative care service follow-up, open to Hospice in the future.        Chief Complaint: Worsening leg edema History of Present Illness: Meghan Russo is a 81 y.o. female with PMH of hypertension, dCHF (EF 55 to 60%), GIB, Afib not on anticoagulation to due to hx of GI bleeding, arthritis, anemia, pericarditis, ascites, CKD stage IV, tricuspid regurgitation, severe pulmonary hypertension, who presents with worsening leg edema.  Patient is accompanied by her daughter, who states that patient's bilateral leg edema has been worsening recently. Body weight has increased by nearly 15 pounds. Leg edema has been extending into her abdomen. Daughter states that patient is taking Lasix 80 mg 3 times a day and metolazone. Patient denies chest pain, shortness of breath, abdominal pain, diarrhea, dysuria, burning on urination, unilateral weakness. Since patient is taking Lasix, not sure whether the patient has increased urinary frequency.  In ED, patient was found to have WBC 5.3, temperature normal, stable renal function. Urinalysis is positive, but urine collection is dirty catch with many squamous cells. Patient is admitted to  inpatient for further evaluation and treatment.  She has been treated with 80 mg lasix IV TID.   Primary Diagnoses  Present on Admission:  . Pitting edema . Severe tricuspid regurgitation . Moderate to severe pulmonary hypertension . Lower GI bleed . Hypertension . CKD (chronic kidney disease) stage 4, GFR  15-29 ml/min . Chronic diastolic CHF (congestive heart failure) . Bilateral leg edema . Anemia . Anasarca . Acute on chronic diastolic heart failure . Decubitus ulcer of sacral region, stage 2 . GERD (gastroesophageal reflux disease) . Ascites  Palliative Review of Systems: Meghan Russo denis pain, NV, dyspnea, or anxiety at this time.   I have reviewed the medical record, interviewed the patient and family, and examined the patient. The following aspects are pertinent.  Past Medical History  Diagnosis Date  . Hypertension   . Moderate aortic stenosis 12/2013  . Moderate to severe pulmonary hypertension   . Arthritis     knee pain   . Bradycardia     a. 03/2014: HR 30's, sinus pauses and Wenkebach - BB discontinued.  . Valvular heart disease 2006    severe tricuspid regurge, moderate aortic insufficiency, mild mitral regurge  . Chronic diastolic CHF (congestive heart failure)   . Anemia 2005    Normocytic  . Osteoporosis 2005  . Pericarditis 03/2004  . Atrial fibrillation   . GIB (gastrointestinal bleeding)   . Chronic kidney disease, stage IV (severe)     Meghan Russo 11/18/2014  . Ascites     hx/notes 11/18/2014   Social History   Social History  . Marital Status: Widowed    Spouse Name: N/A  . Number of Children: N/A  . Years of Education: N/A   Social History Main Topics  . Smoking status: Never Smoker   . Smokeless tobacco: Never Used  . Alcohol Use: No  . Drug Use: No  . Sexual Activity: No   Other Topics Concern  . None   Social History Narrative   Family History  Problem Relation Age of Onset  . Hypertension    . CAD Sister   . Hypertension Brother   . Diabetes type II Other   . Heart failure Daughter   . Breast cancer Daughter    Scheduled Meds: . Chlorhexidine Gluconate Cloth  6 each Topical Q0600  . feeding supplement (PRO-STAT SUGAR FREE 64)  30 mL Oral Q1500  . furosemide  80 mg Intravenous Once  . furosemide  80 mg Intravenous 3 times per day   . heparin  5,000 Units Subcutaneous 3 times per day  . hydrocerin   Topical BID  . isosorbide dinitrate  20 mg Oral TID  . mupirocin ointment  1 application Nasal BID  . pantoprazole  40 mg Oral Q1200  . polyethylene glycol  17 g Oral Daily  . potassium chloride  20 mEq Oral Daily  . sodium chloride  3 mL Intravenous Q12H  . vitamin C  500 mg Oral BID   Continuous Infusions:  PRN Meds:.sodium chloride, acetaminophen, acetaminophen, HYDROcodone-acetaminophen, ondansetron (ZOFRAN) IV, sodium chloride Medications Prior to Admission:  Prior to Admission medications   Medication Sig Start Date End Date Taking? Authorizing Provider  acetaminophen (TYLENOL) 500 MG tablet Take 1,000 mg by mouth every 6 (six) hours as needed for moderate pain (pain).    Yes Historical Provider, MD  Amino Acids-Protein Hydrolys (FEEDING SUPPLEMENT, PRO-STAT SUGAR FREE 64,) LIQD Take 30 mLs by mouth daily at 3 pm.   Yes Historical Provider, MD  furosemide (LASIX) 80 MG tablet Take 1 tablet (80 mg total) by mouth 3 (three) times daily. 07/12/14  Yes Janetta Hora, PA-C  HYDROcodone-acetaminophen (NORCO/VICODIN) 5-325 MG per tablet Take 1 tablet by mouth every 6 (six) hours as needed for moderate pain. 09/27/14  Yes Shanker Levora Dredge, MD  isosorbide dinitrate (ISORDIL) 20 MG tablet Take 1 tablet (20 mg total) by mouth 3 (three) times daily. 03/22/14  Yes Brittainy Sherlynn Carbon, PA-C  metolazone (ZAROXOLYN) 5 MG tablet Take 5 mg by mouth 3 (three) times a week. Take on Mon, Wed, and Frid   Yes Historical Provider, MD  Multiple Vitamins-Minerals (ONE-A-DAY 50 PLUS PO) Take 1 tablet by mouth daily at 3 pm.   Yes Historical Provider, MD  pantoprazole (PROTONIX) 40 MG tablet Take 1 tablet (40 mg total) by mouth daily at 12 noon. 09/27/14  Yes Shanker Levora Dredge, MD  polyethylene glycol (MIRALAX / GLYCOLAX) packet Take 17 g by mouth daily. 09/27/14  Yes Shanker Levora Dredge, MD  potassium chloride SA (K-DUR,KLOR-CON) 20 MEQ tablet  Take 1 tablet (20 mEq total) by mouth daily. 07/19/14  Yes Rosalio Macadamia, NP  vitamin C (ASCORBIC ACID) 500 MG tablet Take 500 mg by mouth 2 (two) times daily.   Yes Historical Provider, MD   Allergies  Allergen Reactions  . Asa [Aspirin] Other (See Comments)    GI Upset   CBC:    Component Value Date/Time   WBC 5.3 11/18/2014 1907   WBC 4.6 11/16/2013 1908   HGB 9.2* 11/18/2014 1907   HGB 9.5* 11/16/2013 1908   HCT 28.1* 11/18/2014 1907   HCT 30.5* 11/16/2013 1908   PLT 168 11/18/2014 1907   MCV 93.7 11/18/2014 1907   MCV 102.4* 11/16/2013 1908   NEUTROABS 3.1 11/18/2014 1907   LYMPHSABS 1.2 11/18/2014 1907   MONOABS 0.7 11/18/2014 1907   EOSABS 0.2 11/18/2014 1907   BASOSABS 0.0 11/18/2014 1907   Comprehensive Metabolic Panel:    Component Value Date/Time   NA 140 11/19/2014 0326   NA 140 03/29/2014 1146   K 4.1 11/19/2014 0326   CL 104 11/19/2014 0326   CO2 23 11/19/2014 0326   BUN 102* 11/19/2014 0326   BUN 70* 03/29/2014 1146   CREATININE 2.48* 11/19/2014 0326   CREATININE 2.47* 06/22/2014 1002   GLUCOSE 111* 11/19/2014 0326   GLUCOSE 92 03/29/2014 1146   CALCIUM 8.6* 11/19/2014 0326   AST 12* 09/23/2014 0510   ALT 9* 09/23/2014 0510   ALKPHOS 86 09/23/2014 0510   BILITOT 1.1 09/23/2014 0510   PROT 6.5 09/23/2014 0510   ALBUMIN 2.8* 09/23/2014 0510    Physical Exam: Vital Signs: BP 121/52 mmHg  Pulse 78  Temp(Src) 99.1 F (37.3 C) (Oral)  Resp 19  Ht 5\' 4"  (1.626 m)  Wt 86.6 kg (190 lb 14.7 oz)  BMI 32.75 kg/m2  SpO2 98% SpO2: SpO2: 98 % O2 Device: O2 Device: Not Delivered O2 Flow Rate:   Intake/output summary:  Intake/Output Summary (Last 24 hours) at 11/19/14 1436 Last data filed at 11/19/14 1304  Gross per 24 hour  Intake    620 ml  Output   2425 ml  Net  -1805 ml   LBM: Last BM Date: 11/18/14 Baseline Weight: Weight: 84.369 kg (186 lb) Most recent weight: Weight: 86.6 kg (190 lb 14.7 oz)  Exam Findings:  Constitutional:  Lying  quietly in bed, makes and keeps eye contact. Drowsy.  Resp:  Even and non labored.  Abd:  soft, rounded, non tender.  Extremities: 3+ b/l leg edema, warm to touch.          Palliative Performance Scale: 40%               Additional Data Reviewed: Recent Labs     11/18/14  1907  11/19/14  0326  WBC  5.3   --   HGB  9.2*   --   PLT  168   --   NA  141  140  BUN  103*  102*  CREATININE  2.54*  2.48*     Time In: 1310 Time Out: 1430 Time Total:  80 minutes Greater than 50%  of this time was spent counseling and coordinating care related to the above assessment and plan. GOC discussion shared with nursing staff, SW, and Dr. Jerral Ralph.   Signed by: Katheran Awe, NP  Katheran Awe, NP  11/19/2014, 2:36 PM  Please contact Palliative Medicine Team phone at [phone] for questions and concerns.

## 2014-11-19 NOTE — Evaluation (Signed)
Physical Therapy Evaluation Patient Details Name: ARNIE MAIOLO MRN: [id number] DOB: 04-27-1920 Today's Date: 11/19/2014   History of Present Illness  81 yo female who is SNF resident was admitted for worsening LE edema, CHF acute on chronic.  Hx of tricuspid regurg, CKD 3, CHF, A-fib.    Clinical Impression  Pt is having a better day now with reduced LE edema, able to tolerate gait with 2 person assist.  Had been to SNF before her admission.  Will plan to continue mobility as tolerated so she can transition to lower care level if possible.  Her family is not present to see if SNF is a final destination or if she is going home afterward.    Follow Up Recommendations SNF    Equipment Recommendations  None recommended by PT    Recommendations for Other Services       Precautions / Restrictions Precautions Precautions: Fall (telemetry) Precaution Comments: sensitivity on LE's due to skin breakdown from reducing edema Restrictions Weight Bearing Restrictions: No      Mobility  Bed Mobility Overal bed mobility: Needs Assistance;+2 for physical assistance;+ 2 for safety/equipment Bed Mobility: Supine to Sit;Rolling Rolling: Mod assist;+2 for physical assistance;+2 for safety/equipment   Supine to sit: Mod assist;+2 for physical assistance;+2 for safety/equipment;HOB elevated     General bed mobility comments: cues to reach for bedrail and to sequence  Transfers Overall transfer level: Needs assistance Equipment used: Rolling walker (2 wheeled);2 person hand held assist Transfers: Sit to/from Stand Sit to Stand: Mod assist;+2 physical assistance;+2 safety/equipment;From elevated surface         General transfer comment: reminders for hand placement and to control her balance upon initial standing, 2 person assist for keeping feet from sliding on floor  Ambulation/Gait Ambulation/Gait assistance: +2 physical assistance;+2 safety/equipment;Mod assist Ambulation Distance  (Feet): 7 Feet Assistive device: Rolling walker (2 wheeled);2 person hand held assist Gait Pattern/deviations: Step-to pattern;Decreased stride length;Wide base of support;Trunk flexed;Shuffle Gait velocity: reduced Gait velocity interpretation: Below normal speed for age/gender General Gait Details: Has limited control of posture and abiltiy to advance walker, PT assisting to sequence and advance walker  Stairs            Wheelchair Mobility    Modified Rankin (Stroke Patients Only)       Balance Overall balance assessment: Needs assistance Sitting-balance support: Feet supported;Bilateral upper extremity supported Sitting balance-Leahy Scale: Fair   Postural control: Posterior lean Standing balance support: Bilateral upper extremity supported Standing balance-Leahy Scale: Poor Standing balance comment: dense cues for controlling standing as pt tends to just let feel slide out in front of her                             Pertinent Vitals/Pain Pain Assessment: Faces Faces Pain Scale: Hurts even more Pain Location: B Legs with movement Pain Intervention(s): Limited activity within patient's tolerance;Monitored during session;Repositioned    Home Living Family/patient expects to be discharged to:: Skilled nursing facility                      Prior Function Level of Independence: Needs assistance   Gait / Transfers Assistance Needed: walks about 28' at SNF with RW with assist  ADL's / Homemaking Assistance Needed: SNF resident for now        Hand Dominance   Dominant Hand: Right    Extremity/Trunk Assessment   Upper Extremity Assessment: Defer to OT  evaluation           Lower Extremity Assessment: Generalized weakness      Cervical / Trunk Assessment: Kyphotic  Communication   Communication: HOH  Cognition Arousal/Alertness: Awake/alert Behavior During Therapy: WFL for tasks assessed/performed Overall Cognitive Status: No  family/caregiver present to determine baseline cognitive functioning       Memory: Decreased recall of precautions              General Comments General comments (skin integrity, edema, etc.): Pt is motivated and has good vitals with activity.  Sats were 98% in bed and 100% sitting, pulses 77 at rest to 84 with sitting.  Very motivated and willing to exert to walk.    Exercises        Assessment/Plan    PT Assessment Patient needs continued PT services  PT Diagnosis Generalized weakness;Difficulty walking   PT Problem List Decreased range of motion;Decreased strength;Decreased activity tolerance;Decreased balance;Decreased mobility;Decreased coordination;Decreased cognition;Decreased knowledge of use of DME;Decreased safety awareness;Cardiopulmonary status limiting activity;Decreased skin integrity  PT Treatment Interventions DME instruction;Gait training;Functional mobility training;Therapeutic activities;Therapeutic exercise;Balance training;Neuromuscular re-education;Patient/family education   PT Goals (Current goals can be found in the Care Plan section) Acute Rehab PT Goals Patient Stated Goal: to try to walk PT Goal Formulation: With patient Time For Goal Achievement: 12/03/14 Potential to Achieve Goals: Good    Frequency Min 2X/week   Barriers to discharge Other (comment) (returning to same SNF)      Co-evaluation               End of Session Equipment Utilized During Treatment: Gait belt Activity Tolerance: Patient tolerated treatment well;Patient limited by fatigue;Other (comment) (weakness ) Patient left: in chair;with call bell/phone within reach;Other (comment) (OT with pt when PT finished) Nurse Communication: Mobility status         Time: 1035-1101 PT Time Calculation (min) (ACUTE ONLY): 26 min   Charges:   PT Evaluation $Initial PT Evaluation Tier I: 1 Procedure PT Treatments $Gait Training: 8-22 mins   PT G Codes:        Ivar Drape 12/06/14, 11:31 AM   Samul Dada, PT MS Acute Rehab Dept. Number: [facility] [id number] and [facility] [phone]

## 2014-11-19 NOTE — Clinical Social Work Placement (Signed)
   CLINICAL SOCIAL WORK PLACEMENT  NOTE  Date:  11/19/2014  Patient Details  Name: Meghan Russo MRN: [id number] Date of Birth: May 28, 1920  Clinical Social Work is seeking post-discharge placement for this patient at the Skilled  Nursing Facility level of care (*CSW will initial, date and re-position this form in  chart as items are completed):  Yes   Patient/family provided with [facility] Clinical Social Work Department's list of facilities offering this level of care within the geographic area requested by the patient (or if unable, by the patient's family).  Yes   Patient/family informed of their freedom to choose among providers that offer the needed level of care, that participate in Medicare, Medicaid or managed care program needed by the patient, have an available bed and are willing to accept the patient.  Yes   Patient/family informed of [facility]'s ownership interest in [facility] and [facility], as well as of the fact that they are under no obligation to receive care at these facilities.  PASRR submitted to EDS on       PASRR number received on       Existing PASRR number confirmed on 11/19/14     FL2 transmitted to all facilities in geographic area requested by pt/family on 11/19/14     FL2 transmitted to all facilities within larger geographic area on       Patient informed that his/her managed care company has contracts with or will negotiate with certain facilities, including the following:            Patient/family informed of bed offers received.  Patient chooses bed at       Physician recommends and patient chooses bed at      Patient to be transferred to   on  .  Patient to be transferred to facility by       Patient family notified on   of transfer.  Name of family member notified:        PHYSICIAN Please sign FL2, Please sign DNR     Additional Comment:    _______________________________________________ Sharol Harness,  Theresia Majors [phone]

## 2014-11-19 NOTE — Consult Note (Signed)
WOC wound consult note Reason for Consult: sacrum, however no pressure ulcer found at the time of my assessment. She has MASD issues related to incontinence, she has a FC in place currently. She is incontinent of stool.  Her buttocks are intact and the sacrum is intact She does however have a full thickness venous stasis ulceration on the left medial malleolus and evidence of healed ulcers over the bilateral LE. She has venous dermatitis.  Palpable pulses bilaterally, no significant edema from the knees down, but she does from the knees up into the abdomen. Patient reports she has been treated by the wound care center. I did talk with the wound care center and she has not been seen since March of this year.  She has been in an SNF.   Wound type: venous ulceration   Pressure Ulcer POA: No Measurement: Left medial malleolus: 1.0cm x 1.5cm x 0.2cm  Wound bed: [place] base , dry with some pink at wound edges  Drainage (amount, consistency, odor) minimal, serous, no odor Periwound: venous dermatitis  Dressing procedure/placement/frequency: Silicone foam over the ulcer, elevate legs as much as possible.  Eucerin for venous stasis dermatitis.   Discussed POC with patient and bedside nurse.  Re consult if needed, will not follow at this time. Thanks  Carolyn Maniscalco [employer], CWOCN [phone])

## 2014-11-20 ENCOUNTER — Inpatient Hospital Stay (HOSPITAL_COMMUNITY): Payer: Medicare Other

## 2014-11-20 DIAGNOSIS — I5033 Acute on chronic diastolic (congestive) heart failure: Principal | ICD-10-CM

## 2014-11-20 DIAGNOSIS — I509 Heart failure, unspecified: Secondary | ICD-10-CM

## 2014-11-20 LAB — BASIC METABOLIC PANEL
ANION GAP: 10 (ref 5–15)
BUN: 103 mg/dL — AB (ref 6–20)
CO2: 25 mmol/L (ref 22–32)
Calcium: 8.3 mg/dL — ABNORMAL LOW (ref 8.9–10.3)
Chloride: 105 mmol/L (ref 101–111)
Creatinine, Ser: 2.42 mg/dL — ABNORMAL HIGH (ref 0.44–1.00)
GFR calc Af Amer: 19 mL/min — ABNORMAL LOW (ref >60)
GFR calc non Af Amer: 16 mL/min — ABNORMAL LOW (ref >60)
GLUCOSE: 78 mg/dL (ref 65–99)
POTASSIUM: 4.1 mmol/L (ref 3.5–5.1)
Sodium: 140 mmol/L (ref 135–145)

## 2014-11-20 LAB — HEMOGLOBIN A1C
HEMOGLOBIN A1C: 5.6 % (ref 4.8–5.6)
Mean Plasma Glucose: 114 mg/dL

## 2014-11-20 LAB — MAGNESIUM: Magnesium: 1.9 mg/dL (ref 1.7–2.4)

## 2014-11-20 NOTE — Progress Notes (Signed)
PATIENT DETAILS Name: Meghan Russo Age: 81 y.o. Sex: female Date of Birth: 1921/02/13 Admit Date: 11/18/2014 Admitting Physician Lorretta Harp, MD ONG:[email or web address],LKGMW N, MD  Subjective:  Patient in bed, wants to get up and sit in chair. Denies any headache. No chest pain or shortness of breath. No abdominal pain. No focal weakness. Says swelling in her thighs is going down.  Assessment/Plan:   Acute on chronic diastolic heart failure with severe pulmonary hypertension and likely R.Heart failure: Although improved, continues to have significant volume overload. Suspect significant amount of right-sided heart failure. Continue diuresis baseline weight appears to be close to 150 pounds per last discharge summary, this likely is not correct however she still has significant edema in her thighs and will continue to diurese through IV Lasix. Salt and fluid restriction. Echocardiogram done today results pending.  Filed Weights   11/18/14 2159 11/19/14 0441 11/20/14 0510  Weight: 87.3 kg (192 lb 7.4 oz) 86.6 kg (190 lb 14.7 oz) 86.5 kg (190 lb 11.2 oz)     Valvular heart disease: Has history of moderate AS/AI/MR and severe TR with pulmonary hypertension. Reviewed outpatient cardiology note-conservative treatment has been recommended.  Chronic kidney disease stage IV: Creatinine close to her usual baseline of 2.4, watch closely while on intravenous Lasix.  Ascites: seen on previous CT Abd on 6/26. Suspected cardiac cirrhoses. Currently abd is not tense-no indication for paracentesis at this time. Continue Lasix IV as above-check LFTs-doubt given advanced age and frailty-further workup is required in the setting while inpatient.  Decubitus ulcer of sacral region, stage 2:Wound care consult-known as prior to this admission  Essential hypertension: Controlled with Imdur. Follow  Moderate to severe pulmonary hypertension: Secondary to valvular abnormalities. Outpatient monitoring.    History of Atrioventricular block, Mobitz type 1, Wenckebach: avoid beta blockers, calcium channel blockers   Palliative care: Elderly, very frail-with numerous valvular abnormalities and severe pulmonary hypertension-reviewed outpatient cardiology note-recommended conservative measures. Clearly not a candidate for aggressive care. She remains a full code. Spoke with patient's daughter Britta Mccreedy Lyles-explained poor overall prognosis-have consulted palliative care.    Disposition: Remain inpatient-suspect SNF on discharge  Antimicrobial agents  See below  Anti-infectives    None      DVT Prophylaxis: ProphylacticHeparin  Code Status: Full code   Family Communication Barbara Lyles-daughter over the phone  Procedures:  TTE   CONSULTS:  Palliative care  Time spent 40 minutes-Greater than 50% of this time was spent in counseling, explanation of diagnosis, planning of further management, and coordination of care.  MEDICATIONS: Scheduled Meds: . Chlorhexidine Gluconate Cloth  6 each Topical Q0600  . feeding supplement (PRO-STAT SUGAR FREE 64)  30 mL Oral Q1500  . furosemide  80 mg Intravenous Once  . furosemide  80 mg Intravenous 3 times per day  . heparin  5,000 Units Subcutaneous 3 times per day  . hydrocerin   Topical BID  . isosorbide dinitrate  20 mg Oral TID  . mupirocin ointment  1 application Nasal BID  . pantoprazole  40 mg Oral Q1200  . polyethylene glycol  17 g Oral Daily  . potassium chloride  20 mEq Oral Daily  . sodium chloride  3 mL Intravenous Q12H  . vitamin C  500 mg Oral BID   Continuous Infusions:  PRN Meds:.sodium chloride, acetaminophen, acetaminophen, HYDROcodone-acetaminophen, ondansetron (ZOFRAN) IV, sodium chloride    PHYSICAL EXAM: Vital signs in last 24 hours:  Filed Vitals:   11/19/14 1712 11/19/14 2133 11/20/14 0118 11/20/14 0510  BP: 125/52 121/51 111/40 117/51  Pulse: 76 80 76 75  Temp: 98.5 F (36.9 C) 98.6 F (37 C) 99 F  (37.2 C) 98.9 F (37.2 C)  TempSrc: Oral Oral Oral Oral  Resp: Height:      Weight:    86.5 kg (190 lb 11.2 oz)  SpO2: 95% 95% 97% 100%    Weight change: 2.131 kg (4 lb 11.2 oz) Filed Weights   11/18/14 2159 11/19/14 0441 11/20/14 0510  Weight: 87.3 kg (192 lb 7.4 oz) 86.6 kg (190 lb 14.7 oz) 86.5 kg (190 lb 11.2 oz)   Body mass index is 32.72 kg/(m^2).   Gen Exam: Awake and alert with clear speech. Chronically sick looking. Neck: Supple, +JVD.   Chest: B/L Clear.   CVS: S1 S2 Regular, no murmurs.  Abdomen: soft, BS +, non tender, distended but not tense Extremities: ++ edema mostly in her thighs, lower extremities warm to touch. Neurologic: Non Focal-but with gen weakness Skin: No Rash.    Intake/Output from previous day:  Intake/Output Summary (Last 24 hours) at 11/20/14 1054 Last data filed at 11/20/14 0820  Gross per 24 hour  Intake    860 ml  Output   1600 ml  Net   -740 ml     LAB RESULTS: CBC  Recent Labs Lab 11/18/14 1907  WBC 5.3  HGB 9.2*  HCT 28.1*  PLT 168  MCV 93.7  MCH 30.7  MCHC 32.7  RDW 17.7*  LYMPHSABS 1.2  MONOABS 0.7  EOSABS 0.2  BASOSABS 0.0    Chemistries   Recent Labs Lab 11/18/14 1907 11/19/14 0326 11/20/14 0310  NA 141 140 140  K 4.3 4.1 4.1  CL 106 104 105  CO2 21* 23 25  GLUCOSE 106* 111* 78  BUN 103* 102* 103*  CREATININE 2.54* 2.48* 2.42*  CALCIUM 8.8* 8.6* 8.3*  MG  --  2.0 1.9    CBG: No results for input(s): GLUCAP in the last 168 hours.  GFR Estimated Creatinine Clearance: 15.8 mL/min (by C-G formula based on Cr of 2.42).  Coagulation profile  Recent Labs Lab 11/18/14 2128  INR 1.29    Cardiac Enzymes No results for input(s): CKMB, TROPONINI, MYOGLOBIN in the last 168 hours.  Invalid input(s): CK  Invalid input(s): POCBNP No results for input(s): DDIMER in the last 72 hours.  Recent Labs  11/19/14 0326  HGBA1C 5.6    Recent Labs  11/19/14 0326  CHOL 70  HDL 29*   LDLCALC 36  TRIG 25  CHOLHDL 2.4   No results for input(s): TSH, T4TOTAL, T3FREE, THYROIDAB in the last 72 hours.  Invalid input(s): FREET3 No results for input(s): VITAMINB12, FOLATE, FERRITIN, TIBC, IRON, RETICCTPCT in the last 72 hours. No results for input(s): LIPASE, AMYLASE in the last 72 hours.  Urine Studies No results for input(s): UHGB, CRYS in the last 72 hours.  Invalid input(s): UACOL, UAPR, USPG, UPH, UTP, UGL, UKET, UBIL, UNIT, UROB, ULEU, UEPI, UWBC, URBC, UBAC, CAST, UCOM, BILUA  MICROBIOLOGY: Recent Results (from the past 240 hour(s))  MRSA PCR Screening     Status: Abnormal   Collection Time: 11/18/14 11:00 PM  Result Value Ref Range Status   MRSA by PCR POSITIVE (A) NEGATIVE Final    Comment:        The GeneXpert MRSA Assay (FDA approved for NASAL specimens only), is one component of  a comprehensive MRSA colonization surveillance program. It is not intended to diagnose MRSA infection nor to guide or monitor treatment for MRSA infections. RESULT CALLED TO, READ BACK BY AND VERIFIED WITH: R.HANEY,RN 0148 11/19/14 M.CAMPBELL     RADIOLOGY STUDIES/RESULTS: Dg Chest Port 1 View  11/18/2014   CLINICAL DATA:  Lower extremity edema for 3 weeks  EXAM: PORTABLE CHEST - 1 VIEW  COMPARISON:  September 25, 2014  FINDINGS: There is stable cardiomegaly. There is less edema compared to prior study. Currently there is trace interstitial edema. There is a minimal right effusion. There is no airspace consolidation. No adenopathy. There is atherosclerotic change in aorta. There are small cervical ribs bilaterally.  IMPRESSION: There is a degree of congestive heart failure, although there is significantly less edema compared to prior study. No airspace consolidation. No adenopathy.   Electronically Signed   By: Bretta Bang III M.D.   On: 11/18/2014 21:34    Leroy Sea, MD  [facility] Pager:[phone]  If 7PM-7AM, please contact  night-coverage [email or web address] Password [email or web address] 11/20/2014, 10:54 AM   LOS: 2 days

## 2014-11-20 NOTE — Progress Notes (Signed)
  Echocardiogram 2D Echocardiogram has been performed.  Meghan Russo 11/20/2014, 10:49 AM

## 2014-11-21 ENCOUNTER — Inpatient Hospital Stay (HOSPITAL_COMMUNITY): Payer: Medicare Other

## 2014-11-21 DIAGNOSIS — M7989 Other specified soft tissue disorders: Secondary | ICD-10-CM

## 2014-11-21 LAB — MAGNESIUM: Magnesium: 1.9 mg/dL (ref 1.7–2.4)

## 2014-11-21 LAB — BASIC METABOLIC PANEL
ANION GAP: 11 (ref 5–15)
BUN: 115 mg/dL — ABNORMAL HIGH (ref 6–20)
CHLORIDE: 105 mmol/L (ref 101–111)
CO2: 24 mmol/L (ref 22–32)
Calcium: 8.2 mg/dL — ABNORMAL LOW (ref 8.9–10.3)
Creatinine, Ser: 2.35 mg/dL — ABNORMAL HIGH (ref 0.44–1.00)
GFR calc non Af Amer: 17 mL/min — ABNORMAL LOW (ref >60)
GFR, EST AFRICAN AMERICAN: 20 mL/min — AB (ref >60)
Glucose, Bld: 82 mg/dL (ref 65–99)
Potassium: 4.1 mmol/L (ref 3.5–5.1)
Sodium: 140 mmol/L (ref 135–145)

## 2014-11-21 MED ORDER — TECHNETIUM TO 99M ALBUMIN AGGREGATED
6.0000 | Freq: Once | INTRAVENOUS | Status: AC | PRN
  Administered 2014-11-21: 6 via INTRAVENOUS

## 2014-11-21 MED ORDER — DEXTROSE 5 % IV SOLN
1.0000 g | INTRAVENOUS | Status: AC
  Administered 2014-11-21 – 2014-11-23 (×3): 1 g via INTRAVENOUS
  Filled 2014-11-21 (×3): qty 10

## 2014-11-21 MED ORDER — TECHNETIUM TC 99M DIETHYLENETRIAME-PENTAACETIC ACID: 40.0000 | Freq: Once | INTRAVENOUS | Status: DC | PRN

## 2014-11-21 NOTE — Progress Notes (Signed)
Daily Progress Note   Patient Name: Meghan Russo       Date: 11/21/2014 DOB: [birth year]  Age: 81 y.o. MRN#: [id number] Attending Physician: Thurnell Lose, MD Primary Care Physician: Maximino Greenland, MD Admit Date: 11/18/2014  Reason for Consultation/Follow-up: Establishing goals of care  Subjective: Pt is a 81 yo female with mulit-system disease: dCHF with increased pulmonary arterial pressure, valvular disorder, CKD stage 4, pulmonary hypertention . Met with Meghan Stack, NP for initial [place] discussion and family elected DNR. We continued discussion today particularly disease progression r/t failing heart, kidneys and pulmonary function. We also talked about what pt could experience in terms of symptoms moving forward. Daughter Meghan Russo wants to take pt home with her since this is her mother's wish. I did share that it is my opinion that she would meet hospice in home criteria. We talked about prognosis. Barring an acute event which she could have, anticipated prognosis of less than 6 months.  Daughters are able to verbalize her decline and think that she knows how sick she is and that is why she wants to go home. Pt has been OOB to recliner. She can only pivot and needs 2 person assist Interval Events: Doppler negative for LE DVT; VQ scan results pending Length of Stay: 3 days  Current Medications: Scheduled Meds:  . cefTRIAXone (ROCEPHIN)  IV  1 g Intravenous Q24H  . Chlorhexidine Gluconate Cloth  6 each Topical Q0600  . feeding supplement (PRO-STAT SUGAR FREE 64)  30 mL Oral Q1500  . furosemide  80 mg Intravenous Once  . furosemide  80 mg Intravenous 3 times per day  . heparin  5,000 Units Subcutaneous 3 times per day  . hydrocerin   Topical BID  . isosorbide dinitrate  20 mg Oral TID  . mupirocin ointment  1 application Nasal BID  . pantoprazole  40 mg Oral Q1200  . polyethylene glycol  17 g Oral Daily  . potassium chloride  20 mEq Oral Daily  . sodium chloride  3 mL Intravenous Q12H    . vitamin C  500 mg Oral BID    Continuous Infusions:    PRN Meds: sodium chloride, acetaminophen, acetaminophen, HYDROcodone-acetaminophen, ondansetron (ZOFRAN) IV, sodium chloride, technetium TC 78M diethylenetriame-pentaacetic acid  Palliative Performance Scale: 30%     Vital Signs: BP 107/43 mmHg  Pulse 71  Temp(Src) 99.3 F (37.4 C) (Oral)  Resp 18  Ht '5\' 4"'  (1.626 m)  Wt 85.3 kg (188 lb 0.8 oz)  BMI 32.26 kg/m2  SpO2 94% SpO2: SpO2: 94 % O2 Device: O2 Device: Not Delivered O2 Flow Rate:    Intake/output summary:  Intake/Output Summary (Last 24 hours) at 11/21/14 1643 Last data filed at 11/21/14 0600  Gross per 24 hour  Intake    120 ml  Output   1050 ml  Net   -930 ml   LBM:   Baseline Weight: Weight: 84.369 kg (186 lb) Most recent weight: Weight: 85.3 kg (188 lb 0.8 oz)  Physical Exam: General: Elderly well nourished female in no acute distress Resp: No work of breathing Cardiac: 2+ LE edema particularly above the knee. Below the knee ? cellulitis    Psych: Alert and oriented to person, place, year.   No agitation     Additional Data Reviewed: Recent Labs     11/18/14  1907   11/20/14  0310  11/21/14  0442  WBC  5.3   --    --    --  HGB  9.2*   --    --    --   PLT  168   --    --    --   NA  141   < >  140  140  BUN  103*   < >  103*  115*  CREATININE  2.54*   < >  2.42*  2.35*   < > = values in this interval not displayed.     Problem List:  Patient Active Problem List   Diagnosis Date Noted  . Palliative care encounter   . DNR (do not resuscitate) discussion   . Pitting edema 11/18/2014  . CHF exacerbation 11/18/2014  . Decubitus ulcer of sacral region, stage 2 11/18/2014  . GERD (gastroesophageal reflux disease) 11/18/2014  . Ascites 11/18/2014  . Atrial fibrillation   . Chronic atrial fibrillation   . Acute blood loss anemia 09/24/2014  . Lower GI bleed   . Pressure ulcer 09/23/2014  . Rectal bleeding 09/22/2014  . GI  bleeding 09/22/2014  . Bradycardia   . Anasarca 07/06/2014  . CKD (chronic kidney disease) stage 4, GFR 15-29 ml/min 07/02/2014  . Acute diastolic HF (heart failure), NYHA class 4 06/28/2014  . Hypertensive heart disease 03/29/2014  . Chronic diastolic CHF (congestive heart failure)   . Hypertension   . Bradycardia by electrocardiogram - with sinus pauses & Wenkebach Block 03/18/2014  . Atrioventricular block, Mobitz type 1, Wenckebach: now BB stopped. 03/18/2014  . Acute on chronic diastolic heart failure 67/34/1937  . Bilateral leg edema 01/19/2014  . Anemia 01/19/2014  . Thrombocytopenia 01/19/2014  . History of rheumatic heart disease 01/19/2014  . Moderate to severe pulmonary hypertension 01/19/2014  . Severe tricuspid regurgitation 01/19/2014  . Mild mitral regurgitation 12/31/2013  . Moderate aortic insufficiency 12/31/2013  . Moderate aortic stenosis 12/11/2012  . Essential hypertension - labile 10/09/2012  . Gout due to renal impairment of multiple sites 10/09/2012  . Lymphadenopathy, inguinal - right 08/09/2011     Palliative Care Assessment & Plan    Code Status:  DNR  Goals of Care:  Return home when medically stable with in-home hospice support       Goal to avoid future hospitlaizations  Psycho-social/Spiritual:  Desire for further Chaplaincy support:no   Prognosis: < 6 months Discharge Planning: Home with Hospice   Care plan was discussed with RN  Thank you for allowing the Palliative Medicine Team to assist in the care of this patient.   Time In: 1100 Time Out: 1145 Total Time 45 Prolonged Time Billed  no     Greater than 50%  of this time was spent counseling and coordinating care related to the above assessment and plan.   Meghan Horn, NP  11/21/2014, 4:43 PM  Please contact Palliative Medicine Team phone at [phone] for questions and concerns.

## 2014-11-21 NOTE — Progress Notes (Signed)
VASCULAR LAB PRELIMINARY  PRELIMINARY  PRELIMINARY  PRELIMINARY  Bilateral lower extremity venous duplex completed.    Preliminary report:  There is no obvious evidence of DVT or SVT noted in the bilateral lower extremities.  Waveforms are pulsatile, suggestive of fluid overload.   Hooper Petteway, RVT 11/21/2014, 10:51 AM

## 2014-11-21 NOTE — Progress Notes (Signed)
PATIENT DETAILS Name: Meghan Russo Age: 81 y.o. Sex: female Date of Birth: 02/07/1921 Admit Date: 11/18/2014 Admitting Physician Lorretta Harp, MD OZH:[id number],ONGEX N, MD  Subjective:  Patient in bed, wants to get up and sit in chair. Denies any headache. No chest pain or shortness of breath. No abdominal pain. No focal weakness. Says swelling in her thighs is going down. Feels better.  Assessment/Plan:   Acute on chronic diastolic heart failure with severe pulmonary hypertension and likely R.Heart failure: Although improved, continues to have significant volume overload. Suspect significant amount of right-sided heart failure. Continue diuresis baseline weight appears to be close to 150 pounds per last discharge summary, this likely is not correct however she still has significant edema in her thighs and will continue to diurese through IV Lasix. Salt and fluid restriction.   Her echocardiogram shows preserved EF of 55% with LVH, however it shows extremely high pulmonary artery pressures of 84 mmHg which is up from 39 mmHg 1 year ago confirming severe pulmonary hypertension of recent onset. Obtain venous duplex & will also obtain a VQ scan to rule out chronic PEs. I think she might be an appropriate candidate for oral Eliquis at low-dose as she seems to have reasonable quality of life.  Filed Weights   11/19/14 0441 11/20/14 0510 11/21/14 0636  Weight: 86.6 kg (190 lb 14.7 oz) 86.5 kg (190 lb 11.2 oz) 85.3 kg (188 lb 0.8 oz)     Valvular heart disease: Has history of moderate AS/AI/MR and severe TR with pulmonary hypertension. Severe pulmonary hypertension plan as above.    Chronic kidney disease stage IV: Creatinine close to her usual baseline of 2.4, watch closely while on intravenous Lasix.  Ascites: seen on previous CT Abd on 6/26. Suspected cardiac cirrhoses. Currently abd is not tense-no indication for paracentesis at this time. Continue Lasix IV as above-check  LFTs-doubt given advanced age and frailty-further workup is required in the setting while inpatient.  Decubitus ulcer of sacral region, stage 2:Wound care consult-known as prior to this admission  Essential hypertension: Controlled with Imdur. Follow  Moderate to severe pulmonary hypertension: Secondary to valvular abnormalities. Outpatient monitoring.   History of Atrioventricular block, Mobitz type 1, Wenckebach: avoid beta blockers, calcium channel blockers  Palliative care: Elderly, very frail-with numerous valvular abnormalities and severe pulmonary hypertension-reviewed outpatient cardiology note-recommended conservative measures. Clearly not a candidate for aggressive care. She remains a full code. [facility] physician spoke with patient's daughter Meghan Russo-explained poor overall prognosis-have consulted palliative care.    Disposition: Remain inpatient-suspect SNF on discharge  Antimicrobial agents  See below  Anti-infectives    Start     Dose/Rate Route Frequency Ordered Stop   11/21/14 0800  cefTRIAXone (ROCEPHIN) 1 g in dextrose 5 % 50 mL IVPB     1 g 100 mL/hr over 30 Minutes Intravenous Every 24 hours 11/21/14 0722 11/24/14 0759      DVT Prophylaxis: ProphylacticHeparin  Code Status: Full code   Family Communication Meghan Russo-daughter over the phone  Procedures:  TTE   - Left ventricle: The cavity size was normal. Wall thickness wasincreased in a pattern of severe LVH. Systolic function wasnormal. The estimated ejection fraction was in the range of 55%to 60%. Wall motion was normal; there were no regional wallmotion abnormalities. - Aortic valve: Valve mobility was restricted. There was moderatestenosis. There was mild regurgitation. Valve area (VTI): 1.12cm^2. Valve area (Vmax): 1.12 cm^2.  Valve area (Vmean): 1.1 cm^2. - Mitral valve: Calcified annulus. Mildly thickened leaflets .There was mild regurgitation. - Left atrium: The atrium was  severely dilated. - Right atrium: The atrium was severely dilated. - Atrial septum: There was an atrial septal aneurysm. - Tricuspid valve: There was severe regurgitation. - Pulmonary arteries: Systolic pressure was severely increased. PApeak pressure: 82 mm Hg (S). (was 39 - 1 yr ago) - Pericardium, extracardiac: A trivial pericardial effusion wasidentified. There was a left pleural effusion.  Impressions - Normal LV function; severe LVH; severe biatrial enlargement;calcified aortic valve with moderate AS and mild AI; mild MR;severe TR with severely elevated pulmonary pressure.   CONSULTS:   Palliative care  Time spent 40 minutes-Greater than 50% of this time was spent in counseling, explanation of diagnosis, planning of further management, and coordination of care.  MEDICATIONS: Scheduled Meds: . cefTRIAXone (ROCEPHIN)  IV  1 g Intravenous Q24H  . Chlorhexidine Gluconate Cloth  6 each Topical Q0600  . feeding supplement (PRO-STAT SUGAR FREE 64)  30 mL Oral Q1500  . furosemide  80 mg Intravenous Once  . furosemide  80 mg Intravenous 3 times per day  . heparin  5,000 Units Subcutaneous 3 times per day  . hydrocerin   Topical BID  . isosorbide dinitrate  20 mg Oral TID  . mupirocin ointment  1 application Nasal BID  . pantoprazole  40 mg Oral Q1200  . polyethylene glycol  17 g Oral Daily  . potassium chloride  20 mEq Oral Daily  . sodium chloride  3 mL Intravenous Q12H  . vitamin C  500 mg Oral BID   Continuous Infusions:  PRN Meds:.sodium chloride, acetaminophen, acetaminophen, HYDROcodone-acetaminophen, ondansetron (ZOFRAN) IV, sodium chloride    PHYSICAL EXAM: Vital signs in last 24 hours: Filed Vitals:   11/20/14 1414 11/20/14 2246 11/21/14 0125 11/21/14 0636  BP: 106/48 130/52 115/39 107/43  Pulse: 74 72 73 71  Temp: 98.5 F (36.9 C) 97.9 F (36.6 C) 99.4 F (37.4 C) 99.3 F (37.4 C)  TempSrc: Oral Oral Oral Oral  Resp: 16 18 18 18   Height:      Weight:     85.3 kg (188 lb 0.8 oz)  SpO2: 98% 97% 97% 94%    Weight change: -1.2 kg (-2 lb 10.3 oz) Filed Weights   11/19/14 0441 11/20/14 0510 11/21/14 0636  Weight: 86.6 kg (190 lb 14.7 oz) 86.5 kg (190 lb 11.2 oz) 85.3 kg (188 lb 0.8 oz)   Body mass index is 32.26 kg/(m^2).   Gen Exam: Awake and alert with clear speech. Chronically sick looking. Neck: Supple, +JVD.   Chest: B/L Clear.   CVS: S1 S2 Regular, no murmurs.  Abdomen: soft, BS +, non tender, distended but not tense Extremities: ++ edema mostly in her thighs, lower extremities warm to touch. Neurologic: Non Focal-but with gen weakness Skin: No Rash.    Intake/Output from previous day:  Intake/Output Summary (Last 24 hours) at 11/21/14 1009 Last data filed at 11/21/14 0600  Gross per 24 hour  Intake    460 ml  Output   2150 ml  Net  -1690 ml     LAB RESULTS: CBC  Recent Labs Lab 11/18/14 1907  WBC 5.3  HGB 9.2*  HCT 28.1*  PLT 168  MCV 93.7  MCH 30.7  MCHC 32.7  RDW 17.7*  LYMPHSABS 1.2  MONOABS 0.7  EOSABS 0.2  BASOSABS 0.0    Chemistries   Recent Labs Lab 11/18/14 1907 11/19/14  1610 11/20/14 0310 11/21/14 0442  NA 141 140 140 140  K 4.3 4.1 4.1 4.1  CL 106 104 105 105  CO2 21* 23 25 24   GLUCOSE 106* 111* 78 82  BUN 103* 102* 103* 115*  CREATININE 2.54* 2.48* 2.42* 2.35*  CALCIUM 8.8* 8.6* 8.3* 8.2*  MG  --  2.0 1.9 1.9    CBG: No results for input(s): GLUCAP in the last 168 hours.  GFR Estimated Creatinine Clearance: 16.1 mL/min (by C-G formula based on Cr of 2.35).  Coagulation profile  Recent Labs Lab 11/18/14 2128  INR 1.29    Cardiac Enzymes No results for input(s): CKMB, TROPONINI, MYOGLOBIN in the last 168 hours.  Invalid input(s): CK  Invalid input(s): POCBNP No results for input(s): DDIMER in the last 72 hours.  Recent Labs  11/19/14 0326  HGBA1C 5.6    Recent Labs  11/19/14 0326  CHOL 70  HDL 29*  LDLCALC 36  TRIG 25  CHOLHDL 2.4   No results for  input(s): TSH, T4TOTAL, T3FREE, THYROIDAB in the last 72 hours.  Invalid input(s): FREET3 No results for input(s): VITAMINB12, FOLATE, FERRITIN, TIBC, IRON, RETICCTPCT in the last 72 hours. No results for input(s): LIPASE, AMYLASE in the last 72 hours.  Urine Studies No results for input(s): UHGB, CRYS in the last 72 hours.  Invalid input(s): UACOL, UAPR, USPG, UPH, UTP, UGL, UKET, UBIL, UNIT, UROB, ULEU, UEPI, UWBC, URBC, UBAC, CAST, UCOM, BILUA  MICROBIOLOGY: Recent Results (from the past 240 hour(s))  MRSA PCR Screening     Status: Abnormal   Collection Time: 11/18/14 11:00 PM  Result Value Ref Range Status   MRSA by PCR POSITIVE (A) NEGATIVE Final    Comment:        The GeneXpert MRSA Assay (FDA approved for NASAL specimens only), is one component of a comprehensive MRSA colonization surveillance program. It is not intended to diagnose MRSA infection nor to guide or monitor treatment for MRSA infections. RESULT CALLED TO, READ BACK BY AND VERIFIED WITH: R.HANEY,RN 0148 11/19/14 M.CAMPBELL     RADIOLOGY STUDIES/RESULTS: Dg Chest Port 1 View  11/18/2014   CLINICAL DATA:  Lower extremity edema for 3 weeks  EXAM: PORTABLE CHEST - 1 VIEW  COMPARISON:  September 25, 2014  FINDINGS: There is stable cardiomegaly. There is less edema compared to prior study. Currently there is trace interstitial edema. There is a minimal right effusion. There is no airspace consolidation. No adenopathy. There is atherosclerotic change in aorta. There are small cervical ribs bilaterally.  IMPRESSION: There is a degree of congestive heart failure, although there is significantly less edema compared to prior study. No airspace consolidation. No adenopathy.   Electronically Signed   By: Bretta Bang III M.D.   On: 11/18/2014 21:34    Leroy Sea, MD  [facility] Pager:[phone]  If 7PM-7AM, please contact night-coverage [email or web address] Password [email or web address] 11/21/2014, 10:09 AM   LOS: 3 days

## 2014-11-22 LAB — MAGNESIUM: MAGNESIUM: 2.1 mg/dL (ref 1.7–2.4)

## 2014-11-22 NOTE — Progress Notes (Signed)
CSW (Clinical Child psychotherapist) made aware that plan will be home with hospice. Pt has no further hospital social work needs. CSW signing off.  Lavaun Greenfield, LCSWA [phone]

## 2014-11-22 NOTE — Progress Notes (Signed)
Referral received from Jiles Crocker, [facility] to go home with [facility]. Writer called and talked with both daughters Britta Mccreedy and Victorino Dike and explained services and hospice philosophy. Family verbalized understanding. Patient has an electric bed, walkers, bsc and a w/c at home. Oxygen ordered through [facility] equipment manager Loistine Simas for prn use. She will contact [facility] to have it delivered to the home tomorrow. Contact is Victorino Dike at the home #. The home address has been verified and is correct in the chart. Per discussion patient  will go home with [facility].  Please send signed and competed DNR home with patient.  She will need discharge prescriptions for comfort medications.  [facility] aware of above. Please notify [facility] when pt. Is ready to leave unit at discharge- call [phone] ( or [phone] after 5p). [facility] contact numbers have been given to [place] during phone conversation. Above information shared with Steward Drone, [facility].Please call with any questions.  Sharen Heck RN [facility] Liaison [phone]

## 2014-11-22 NOTE — Progress Notes (Signed)
PATIENT DETAILS Name: Meghan Russo Age: 81 y.o. Sex: female Date of Birth: 22-Mar-1921 Admit Date: 11/18/2014 Admitting Physician Lorretta Harp, MD ZOX:[id number],UJWJX N, MD   Subjective:  Patient in bed, wants to get up and sit in chair. Denies any headache. No chest pain or shortness of breath. No abdominal pain. No focal weakness. Says swelling in her thighs is going down. Feels better.  Assessment/Plan:   Acute on chronic diastolic heart failure with severe pulmonary hypertension and likely R.Heart failure: Although improved, continues to have significant volume overload. Suspect significant amount of right-sided heart failure. Continue diuresis baseline weight appears to be close to 150 pounds per last discharge summary, this likely is not correct however she still has significant edema in her thighs and will continue to diurese through IV Lasix. Salt and fluid restriction.   Her echocardiogram shows preserved EF of 55% with LVH, however it shows extremely high pulmonary artery pressures of 84 mmHg which is up from 39 mmHg 1 year ago confirming severe pulmonary hypertension of recent onset. Negative venous duplex & VQ scan. Continue diuresis x 1 more day then DC Home in am.  Filed Weights   11/20/14 0510 11/21/14 0636 11/22/14 0557  Weight: 86.5 kg (190 lb 11.2 oz) 85.3 kg (188 lb 0.8 oz) 84.8 kg (186 lb 15.2 oz)     Valvular heart disease: Has history of moderate AS/AI/MR and severe TR with pulmonary hypertension. Severe pulmonary hypertension plan as above.    Chronic kidney disease stage IV: Creatinine close to her usual baseline of 2.4, watch closely while on intravenous Lasix.  Ascites: seen on previous CT Abd on 6/26. Suspected cardiac cirrhoses. Currently abd is not tense-no indication for paracentesis at this time. Continue Lasix IV as above-check LFTs-doubt given advanced age and frailty-further workup is required in the setting while inpatient.  Decubitus  ulcer of sacral region, stage 2:Wound care consult-known as prior to this admission  Essential hypertension: Controlled with Imdur. Follow  Moderate to severe pulmonary hypertension: Secondary to valvular abnormalities. Outpatient monitoring.   History of Atrioventricular block, Mobitz type 1, Wenckebach: avoid beta blockers, calcium channel blockers  Palliative care: Elderly, very frail-with numerous valvular abnormalities and severe pulmonary hypertension-reviewed outpatient cardiology note-recommended conservative measures. Clearly not a candidate for aggressive care. She remains a full code. [facility] physician spoke with patient's daughter Meghan Russo-explained poor overall prognosis-have consulted palliative care.    Disposition:  Likely home hospice on 11/23/2014.  Antimicrobial agents  See below  Anti-infectives    Start     Dose/Rate Route Frequency Ordered Stop   11/21/14 0800  cefTRIAXone (ROCEPHIN) 1 g in dextrose 5 % 50 mL IVPB     1 g 100 mL/hr over 30 Minutes Intravenous Every 24 hours 11/21/14 0722 11/24/14 0759      DVT Prophylaxis: ProphylacticHeparin  Code Status: Full code   Family Communication Meghan Russo-daughter over the phone  Procedures:  TTE   - Left ventricle: The cavity size was normal. Wall thickness wasincreased in a pattern of severe LVH. Systolic function wasnormal. The estimated ejection fraction was in the range of 55%to 60%. Wall motion was normal; there were no regional wallmotion abnormalities. - Aortic valve: Valve mobility was restricted. There was moderatestenosis. There was mild regurgitation. Valve area (VTI): 1.12cm^2. Valve area (Vmax): 1.12 cm^2. Valve area (Vmean): 1.1 cm^2. - Mitral valve: Calcified annulus. Mildly thickened leaflets .There was mild regurgitation. -  Left atrium: The atrium was severely dilated. - Right atrium: The atrium was severely dilated. - Atrial septum: There was an atrial septal aneurysm. -  Tricuspid valve: There was severe regurgitation. - Pulmonary arteries: Systolic pressure was severely increased. PApeak pressure: 82 mm Hg (S). (was 39 - 1 yr ago) - Pericardium, extracardiac: A trivial pericardial effusion wasidentified. There was a left pleural effusion.  Impressions - Normal LV function; severe LVH; severe biatrial enlargement;calcified aortic valve with moderate AS and mild AI; mild MR;severe TR with severely elevated pulmonary pressure.   CONSULTS:   Palliative care  Time spent 40 minutes-Greater than 50% of this time was spent in counseling, explanation of diagnosis, planning of further management, and coordination of care.  MEDICATIONS: Scheduled Meds: . cefTRIAXone (ROCEPHIN)  IV  1 g Intravenous Q24H  . Chlorhexidine Gluconate Cloth  6 each Topical Q0600  . feeding supplement (PRO-STAT SUGAR FREE 64)  30 mL Oral Q1500  . furosemide  80 mg Intravenous Once  . furosemide  80 mg Intravenous 3 times per day  . heparin  5,000 Units Subcutaneous 3 times per day  . hydrocerin   Topical BID  . isosorbide dinitrate  20 mg Oral TID  . mupirocin ointment  1 application Nasal BID  . pantoprazole  40 mg Oral Q1200  . polyethylene glycol  17 g Oral Daily  . potassium chloride  20 mEq Oral Daily  . sodium chloride  3 mL Intravenous Q12H  . vitamin C  500 mg Oral BID   Continuous Infusions:  PRN Meds:.sodium chloride, acetaminophen, acetaminophen, HYDROcodone-acetaminophen, ondansetron (ZOFRAN) IV, sodium chloride, technetium TC 13M diethylenetriame-pentaacetic acid    PHYSICAL EXAM: Vital signs in last 24 hours: Filed Vitals:   11/21/14 0636 11/21/14 1500 11/21/14 2008 11/22/14 0557  BP: 107/43 136/42 112/36 118/45  Pulse: 71 63 62 70  Temp: 99.3 F (37.4 C) 99.7 F (37.6 C) 97.6 F (36.4 C) 97.7 F (36.5 C)  TempSrc: Oral Oral Oral Oral  Resp: 18 18 18 16   Height:      Weight: 85.3 kg (188 lb 0.8 oz)   84.8 kg (186 lb 15.2 oz)  SpO2: 94% 100% 100%  95%    Weight change: -0.5 kg (-1 lb 1.6 oz) Filed Weights   11/20/14 0510 11/21/14 0636 11/22/14 0557  Weight: 86.5 kg (190 lb 11.2 oz) 85.3 kg (188 lb 0.8 oz) 84.8 kg (186 lb 15.2 oz)   Body mass index is 32.07 kg/(m^2).   Gen Exam: Awake and alert with clear speech. Chronically sick looking. Neck: Supple, +JVD.   Chest: B/L Clear.   CVS: S1 S2 Regular, no murmurs.  Abdomen: soft, BS +, non tender, distended but not tense Extremities: ++ edema mostly in her thighs, lower extremities warm to touch. Neurologic: Non Focal-but with gen weakness Skin: No Rash.    Intake/Output from previous day:  Intake/Output Summary (Last 24 hours) at 11/22/14 1128 Last data filed at 11/22/14 1914  Gross per 24 hour  Intake    600 ml  Output   1926 ml  Net  -1326 ml     LAB RESULTS: CBC  Recent Labs Lab 11/18/14 1907  WBC 5.3  HGB 9.2*  HCT 28.1*  PLT 168  MCV 93.7  MCH 30.7  MCHC 32.7  RDW 17.7*  LYMPHSABS 1.2  MONOABS 0.7  EOSABS 0.2  BASOSABS 0.0    Chemistries   Recent Labs Lab 11/18/14 1907 11/19/14 0326 11/20/14 0310 11/21/14 0442 11/22/14 0344  NA 141 140 140 140  --   K 4.3 4.1 4.1 4.1  --   CL 106 104 105 105  --   CO2 21* 23 25 24   --   GLUCOSE 106* 111* 78 82  --   BUN 103* 102* 103* 115*  --   CREATININE 2.54* 2.48* 2.42* 2.35*  --   CALCIUM 8.8* 8.6* 8.3* 8.2*  --   MG  --  2.0 1.9 1.9 2.1    CBG: No results for input(s): GLUCAP in the last 168 hours.  GFR Estimated Creatinine Clearance: 16.1 mL/min (by C-G formula based on Cr of 2.35).  Coagulation profile  Recent Labs Lab 11/18/14 2128  INR 1.29    Cardiac Enzymes No results for input(s): CKMB, TROPONINI, MYOGLOBIN in the last 168 hours.  Invalid input(s): CK  Invalid input(s): POCBNP No results for input(s): DDIMER in the last 72 hours. No results for input(s): HGBA1C in the last 72 hours. No results for input(s): CHOL, HDL, LDLCALC, TRIG, CHOLHDL, LDLDIRECT in the last 72  hours. No results for input(s): TSH, T4TOTAL, T3FREE, THYROIDAB in the last 72 hours.  Invalid input(s): FREET3 No results for input(s): VITAMINB12, FOLATE, FERRITIN, TIBC, IRON, RETICCTPCT in the last 72 hours. No results for input(s): LIPASE, AMYLASE in the last 72 hours.  Urine Studies No results for input(s): UHGB, CRYS in the last 72 hours.  Invalid input(s): UACOL, UAPR, USPG, UPH, UTP, UGL, UKET, UBIL, UNIT, UROB, ULEU, UEPI, UWBC, URBC, UBAC, CAST, UCOM, BILUA  MICROBIOLOGY: Recent Results (from the past 240 hour(s))  MRSA PCR Screening     Status: Abnormal   Collection Time: 11/18/14 11:00 PM  Result Value Ref Range Status   MRSA by PCR POSITIVE (A) NEGATIVE Final    Comment:        The GeneXpert MRSA Assay (FDA approved for NASAL specimens only), is one component of a comprehensive MRSA colonization surveillance program. It is not intended to diagnose MRSA infection nor to guide or monitor treatment for MRSA infections. RESULT CALLED TO, READ BACK BY AND VERIFIED WITH: R.HANEY,RN 0148 11/19/14 M.CAMPBELL     RADIOLOGY STUDIES/RESULTS: Nm Pulmonary Perf And Vent  11/21/2014   CLINICAL DATA:  Chronic pulmonary hypertension related to severe tricuspid regurgitation. Patient also with current history of moderate aortic stenosis, chronic diastolic CHF, hypertension, stage 4 chronic kidney disease. Personal history of rheumatic heart disease.  EXAM: NUCLEAR MEDICINE VENTILATION - PERFUSION LUNG SCAN  TECHNIQUE: Ventilation images were obtained in multiple projections using inhaled aerosol Tc-66m DTPA. Perfusion images were obtained in multiple projections after intravenous injection of Tc-75m MAA.  RADIOPHARMACEUTICALS:  43.5 Technetium-66m DTPA aerosol inhalation and 6.0 Technetium-75m MAA IV.  COMPARISON:  No prior nuclear imaging. Chest x-ray subsequently same date is correlated.  FINDINGS: Ventilation: Slightly diminished segmental ventilation defect in the left lung apex  relative to the right lung apex. No ventilatory abnormality elsewhere. Deposition of aerosol in the central airways.  Perfusion: Matched segmental perfusion defect in the left lung apex. No profusion abnormality elsewhere.  The chest x-ray obtained subsequently demonstrates no focal parenchymal abnormality in the left lung apex.  IMPRESSION: Solitary matched segmental defect in the left lung apex, indicating low probability of pulmonary embolism by PIOPED II criteria.   Electronically Signed   By: Hulan Saas M.D.   On: 11/21/2014 16:45   Dg Chest Port 1 View  11/21/2014   CLINICAL DATA:  Acute superimposed upon chronic diastolic heart failure. Personal history of rheumatic heart  disease leading to a current history of severe tricuspid regurgitation, pulmonary hypertension and moderate aortic stenosis.  EXAM: PORTABLE CHEST - 1 VIEW  COMPARISON:  11/18/2014 and earlier.  FINDINGS: Cardiac silhouette markedly enlarged, unchanged. Interval worsening of now moderate interstitial and airspace pulmonary edema bilaterally. Moderate size right pleural effusion, increased in size, with associated passive atelectasis in the right lower lobe.  IMPRESSION: Worsening CHF with moderate diffuse interstitial and airspace pulmonary edema. Enlarging right pleural effusion and associated passive atelectasis in the right lower lobe.   Electronically Signed   By: Hulan Saas M.D.   On: 11/21/2014 16:47   Dg Chest Port 1 View  11/18/2014   CLINICAL DATA:  Lower extremity edema for 3 weeks  EXAM: PORTABLE CHEST - 1 VIEW  COMPARISON:  September 25, 2014  FINDINGS: There is stable cardiomegaly. There is less edema compared to prior study. Currently there is trace interstitial edema. There is a minimal right effusion. There is no airspace consolidation. No adenopathy. There is atherosclerotic change in aorta. There are small cervical ribs bilaterally.  IMPRESSION: There is a degree of congestive heart failure, although there is  significantly less edema compared to prior study. No airspace consolidation. No adenopathy.   Electronically Signed   By: Bretta Bang III M.D.   On: 11/18/2014 21:34    Leroy Sea, MD  [facility] Pager:[phone]  If 7PM-7AM, please contact night-coverage [email or web address] Password [email or web address] 11/22/2014, 11:28 AM   LOS: 4 days

## 2014-11-22 NOTE — Care Management Note (Signed)
Case Management Note  Patient Details  Name: NAKEYSHA PASQUAL MRN: [id number] Date of Birth: 1921/03/26  Subjective/Objective:      Admitted with CHF              Action/Plan: Talked to patient/ daughter Victorino Dike via telephone about DCP; Victorino Dike requested that the patient goes home with hospice care. Home Hospice choice offered, Victorino Dike chose [facility]; orders/ clinical information faxed.  Expected Discharge Date:   possibly 11/23/2014               Expected Discharge Plan:  Home w Hospice Care  Discharge planning Services  CM Consult Choice offered to:  Adult Children     HH Agency:  [facility]  Status of Service:  In process, will continue to follow    Cherrie Distance RN, MHA,BSN [phone] 11/22/2014, 9:58 AM

## 2014-11-22 NOTE — Clinical Social Work Placement (Signed)
   CLINICAL SOCIAL WORK PLACEMENT  NOTE  Date:  11/22/2014  Patient Details  Name: Meghan Russo MRN: [id number] Date of Birth: 07/02/1920  Clinical Social Work is seeking post-discharge placement for this patient at the Skilled  Nursing Facility level of care (*CSW will initial, date and re-position this form in  chart as items are completed):  Yes   Patient/family provided with [facility] Clinical Social Work Department's list of facilities offering this level of care within the geographic area requested by the patient (or if unable, by the patient's family).  Yes   Patient/family informed of their freedom to choose among providers that offer the needed level of care, that participate in Medicare, Medicaid or managed care program needed by the patient, have an available bed and are willing to accept the patient.  Yes   Patient/family informed of [facility]'s ownership interest in [facility] and [facility], as well as of the fact that they are under no obligation to receive care at these facilities.  PASRR submitted to EDS on       PASRR number received on       Existing PASRR number confirmed on 11/19/14     FL2 transmitted to all facilities in geographic area requested by pt/family on 11/19/14     FL2 transmitted to all facilities within larger geographic area on       Patient informed that his/her managed care company has contracts with or will negotiate with certain facilities, including the following:        No   Patient/family informed of bed offers received.  Patient chooses bed at  [facility])     Physician recommends and patient chooses bed at      Patient to be transferred to   on  .  Patient to be transferred to facility by       Patient family notified on   of transfer.  Name of family member notified:        PHYSICIAN       Additional Comment:    _______________________________________________ Sharol Harness, Theresia Majors [phone]

## 2014-11-23 LAB — MAGNESIUM: MAGNESIUM: 2 mg/dL (ref 1.7–2.4)

## 2014-11-23 MED ORDER — POTASSIUM CHLORIDE CRYS ER 20 MEQ PO TBCR: 20.0000 meq | EXTENDED_RELEASE_TABLET | Freq: Every day | ORAL | Status: AC

## 2014-11-23 MED ORDER — FUROSEMIDE 80 MG PO TABS: 80.0000 mg | ORAL_TABLET | Freq: Three times a day (TID) | ORAL | Status: AC

## 2014-11-23 MED ORDER — METOLAZONE 5 MG PO TABS: 5.0000 mg | ORAL_TABLET | ORAL | Status: AC

## 2014-11-23 NOTE — Clinical Social Work Note (Signed)
Clinical Social Worker arranged ambulance transport via [facility] to patient's home address.   Clinical Social Worker will sign off for now as social work intervention is no longer needed. Please consult [place] again if new need arises.  Derenda Fennel, MSW, LCSWA [phone] 11/23/2014 11:11 AM

## 2014-11-23 NOTE — Progress Notes (Signed)
Patient has a chronic foley catheter that is leaking and has been leaking prior to this shift. Notified hospitalist to see if a foley catheter with higher in french number could be reinserted so that this problem does not occur when she goes home possibly tomorrow with hospice. Orders given for new foley to be inserted. Inserted a 16 french foley catheter with 10cc bulb with nurse tech at bedside. Will monitor for any leakage from foley catheter from here until end of shift. Patient tolerated well without any discomfort. Will continue to monitor patient to end of shift.

## 2014-11-23 NOTE — Discharge Instructions (Signed)
Follow with Primary MD [name], MD in 7 days   Get BMP checked  by Primary MD next visit.    Activity: As tolerated with Full fall precautions use walker/cane & assistance as needed   Disposition Home with Hospice   Diet: Heart Healthy  Check your Weight same time everyday, if you gain over 2 pounds, or you develop in leg swelling, experience more shortness of breath or chest pain, call your Primary MD immediately. Follow Cardiac Low Salt Diet and 1.5 lit/day fluid restriction.   On your next visit with your primary care physician please Get Medicines reviewed and adjusted.   Please request your Prim.MD to go over all Hospital Tests and Procedure/Radiological results at the follow up, please get all Hospital records sent to your Prim MD by signing hospital release before you go home.   If you experience worsening of your admission symptoms, develop shortness of breath, life threatening emergency, suicidal or homicidal thoughts you must seek medical attention immediately by calling 911 or calling your MD immediately  if symptoms less severe.  You Must read complete instructions/literature along with all the possible adverse reactions/side effects for all the Medicines you take and that have been prescribed to you. Take any new Medicines after you have completely understood and accpet all the possible adverse reactions/side effects.   Do not drive, operating heavy machinery, perform activities at heights, swimming or participation in water activities or provide baby sitting services if your were admitted for syncope or siezures until you have seen by Primary MD or a Neurologist and advised to do so again.  Do not drive when taking Pain medications.    Do not take more than prescribed Pain, Sleep and Anxiety Medications  Special Instructions: If you have smoked or chewed Tobacco  in the last 2 yrs please stop smoking, stop any regular Alcohol  and or any Recreational drug  use.  Wear Seat belts while driving.   Please note  You were cared for by a hospitalist during your hospital stay. If you have any questions about your discharge medications or the care you received while you were in the hospital after you are discharged, you can call the unit and asked to speak with the hospitalist on call if the hospitalist that took care of you is not available. Once you are discharged, your primary care physician will handle any further medical issues. Please note that NO REFILLS for any discharge medications will be authorized once you are discharged, as it is imperative that you return to your primary care physician (or establish a relationship with a primary care physician if you do not have one) for your aftercare needs so that they can reassess your need for medications and monitor your lab values.

## 2014-11-23 NOTE — Discharge Summary (Signed)
Meghan H WhiteVALINE DROZDOWSKIy.o. female  DOB 1921/02/27  MRN [id number].  Admission date:  11/18/2014  Admitting Physician  Lorretta Harp, MD  Discharge Date:  11/23/2014   Primary MD  [name], MD  Recommendations for primary care physician for things to follow:   Monitor BMP, weight and diuretic dose closely. Being discharged with home hospice at this time goal of care is comfort.   Admission Diagnosis  CHF (congestive heart failure) [I50.9] Chronic atrial fibrillation [I48.2]   Discharge Diagnosis  CHF (congestive heart failure) [I50.9] Chronic atrial fibrillation [I48.2]     Principal Problem:   Acute on chronic diastolic heart failure Active Problems:   Moderate aortic stenosis   Bilateral leg edema   Anemia   History of rheumatic heart disease   Moderate to severe pulmonary hypertension   Severe tricuspid regurgitation   Chronic diastolic CHF (congestive heart failure)   Hypertension   CKD (chronic kidney disease) stage 4, GFR 15-29 ml/min   Anasarca   Lower GI bleed   Pitting edema   CHF exacerbation   Decubitus ulcer of sacral region, stage 2   GERD (gastroesophageal reflux disease)   Ascites   Palliative care encounter   DNR (do not resuscitate) discussion      Past Medical History  Diagnosis Date  . Hypertension   . Moderate aortic stenosis 12/2013  . Moderate to severe pulmonary hypertension   . Arthritis     knee pain   . Bradycardia     a. 03/2014: HR 30's, sinus pauses and Wenkebach - BB discontinued.  . Valvular heart disease 2006    severe tricuspid regurge, moderate aortic insufficiency, mild mitral regurge  . Chronic diastolic CHF (congestive heart failure)   . Anemia 2005    Normocytic  . Osteoporosis 2005  . Pericarditis 03/2004  . Atrial fibrillation   . GIB  (gastrointestinal bleeding)   . Chronic kidney disease, stage IV (severe)     Meghan Russo 11/18/2014  . Ascites     hx/notes 11/18/2014    Past Surgical History  Procedure Laterality Date  . Varicose vein surgery Right   . Cataract extraction, bilateral Bilateral   . Inguinal lymph node biopsy Right 08/2011    non cancerous benign lymphoid tissue       HPI  from the history and physical done on the day of admission:    Meghan Russo is a 81 y.o. female with PMH of hypertension, dCHF (EF 55 to 60%), GIB, Afib not on anticoagulation to due to hx of GI bleeding, arthritis, anemia, pericarditis, ascites, CKD stage IV, tricuspid regurgitation, severe pulmonary hypertension, who presents with worsening leg edema.  Patient is accompanied by her daughter, who states that patient's bilateral leg edema has been worsening recently. Body weight has increased by nearly 15 pounds. Leg edema has been extending into her abdomen. Daughter states that patient is taking Lasix 80 mg 3 times a day and metolazone. Patient denies chest pain, shortness of breath, abdominal pain, diarrhea,  dysuria, burning on urination, unilateral weakness. Since patient is taking Lasix, not sure whether the patient has increased urinary frequency.  In ED, patient was found to have WBC 5.3, temperature normal, stable renal function. Urinalysis is positive, but urine collection is dirty catch with many squamous cells. Patient is admitted to inpatient for further evaluation and treatment.     Hospital Course:     Acute on chronic diastolic heart failure with severe pulmonary hypertension and likely R.Heart failure: He was treated with IV Lasix along with fluid and salt restriction, she has been diuresed several half liters and she has no shortness of breath, peripheral edema is improving.   Her echocardiogram shows preserved EF of 55% with LVH, however it shows extremely high pulmonary artery pressures of 84 mmHg which is up from 39  mmHg 1 year ago confirming severe pulmonary hypertension of recent onset. Negative venous duplex & VQ scan. Continue diuresis and discharged home with home hospice as planned, would not pursue any further workup for pulmonary hypertension as she is home hospice now with age of 81 and extremely frail status.   Valvular heart disease: Has history of moderate AS/AI/MR and severe TR with pulmonary hypertension. Severe pulmonary hypertension plan as above.   Chronic kidney disease stage IV: Creatinine close to her usual baseline of 2.4, watch closely while on intravenous Lasix.  Ascites: seen on previous CT Abd on 6/26. Suspected cardiac cirrhoses. Currently abd is not tense-no indication for paracentesis at this time. Continue diuretic so as above-check LFTs-doubt given advanced age and frailty-further workup is required in the setting while inpatient.  Decubitus ulcer of sacral region, stage 2:Wound care consult-known as prior to this admission  Essential hypertension: Controlled with Imdur. Follow  Moderate to severe pulmonary hypertension: Secondary to valvular abnormalities. Outpatient monitoring.   History of Atrioventricular block, Mobitz type 1, Wenckebach: avoid beta blockers, calcium channel blockers  Palliative care: Elderly, very frail-with numerous valvular abnormalities and severe pulmonary hypertension-reviewed outpatient cardiology note-recommended conservative measures. Clearly not a candidate for aggressive care.   Palliative care was consulted and after discussions with patient's daughter, patient in palliative care as made DO NOT RESUSCITATE and decision was made that goal of care will be comfort and will be discharged with home hospice.      Discharge Condition: Stable  Follow UP  Follow-up Information    Follow up with Russo,Meghan N, MD. Schedule an appointment as soon as possible for a visit in 1 week.   Specialty:  Internal Medicine   Contact information:    [address] [place] [address] [phone]        Consults obtained - None  Diet and Activity recommendation: See Discharge Instructions below  Discharge Instructions       Discharge Instructions    Discharge instructions    Complete by:  As directed   Follow with Primary MD [name], MD in 7 days   Get BMP checked  by Primary MD next visit.    Activity: As tolerated with Full fall precautions use walker/cane & assistance as needed   Disposition Home with Hospice   Diet: Heart Healthy  Check your Weight same time everyday, if you gain over 2 pounds, or you develop in leg swelling, experience more shortness of breath or chest pain, call your Primary MD immediately. Follow Cardiac Low Salt Diet and 1.5 lit/day fluid restriction.   On your next visit with your primary care physician please Get Medicines reviewed and adjusted.  Please request your Prim.MD to go over all Hospital Tests and Procedure/Radiological results at the follow up, please get all Hospital records sent to your Prim MD by signing hospital release before you go home.   If you experience worsening of your admission symptoms, develop shortness of breath, life threatening emergency, suicidal or homicidal thoughts you must seek medical attention immediately by calling 911 or calling your MD immediately  if symptoms less severe.  You Must read complete instructions/literature along with all the possible adverse reactions/side effects for all the Medicines you take and that have been prescribed to you. Take any new Medicines after you have completely understood and accpet all the possible adverse reactions/side effects.   Do not drive, operating heavy machinery, perform activities at heights, swimming or participation in water activities or provide baby sitting services if your were admitted for syncope or siezures until you have seen by Primary MD or a Neurologist and advised to do so  again.  Do not drive when taking Pain medications.    Do not take more than prescribed Pain, Sleep and Anxiety Medications  Special Instructions: If you have smoked or chewed Tobacco  in the last 2 yrs please stop smoking, stop any regular Alcohol  and or any Recreational drug use.  Wear Seat belts while driving.   Please note  You were cared for by a hospitalist during your hospital stay. If you have any questions about your discharge medications or the care you received while you were in the hospital after you are discharged, you can call the unit and asked to speak with the hospitalist on call if the hospitalist that took care of you is not available. Once you are discharged, your primary care physician will handle any further medical issues. Please note that NO REFILLS for any discharge medications will be authorized once you are discharged, as it is imperative that you return to your primary care physician (or establish a relationship with a primary care physician if you do not have one) for your aftercare needs so that they can reassess your need for medications and monitor your lab values.     Increase activity slowly    Complete by:  As directed              Discharge Medications       Medication List    TAKE these medications        acetaminophen 500 MG tablet  Commonly known as:  TYLENOL  Take 1,000 mg by mouth every 6 (six) hours as needed for moderate pain (pain).     feeding supplement (PRO-STAT SUGAR FREE 64) Liqd  Take 30 mLs by mouth daily at 3 pm.     furosemide 80 MG tablet  Commonly known as:  LASIX  Take 1 tablet (80 mg total) by mouth 3 (three) times daily.     HYDROcodone-acetaminophen 5-325 MG per tablet  Commonly known as:  NORCO/VICODIN  Take 1 tablet by mouth every 6 (six) hours as needed for moderate pain.     isosorbide dinitrate 20 MG tablet  Commonly known as:  ISORDIL  Take 1 tablet (20 mg total) by mouth 3 (three) times daily.      metolazone 5 MG tablet  Commonly known as:  ZAROXOLYN  Take 1 tablet (5 mg total) by mouth 3 (three) times a week. Take on Mon, Wed, and Frid     ONE-A-DAY 50 PLUS PO  Take 1 tablet by mouth daily at 3  pm.     pantoprazole 40 MG tablet  Commonly known as:  PROTONIX  Take 1 tablet (40 mg total) by mouth daily at 12 noon.     polyethylene glycol packet  Commonly known as:  MIRALAX / GLYCOLAX  Take 17 g by mouth daily.     potassium chloride SA 20 MEQ tablet  Commonly known as:  K-DUR,KLOR-CON  Take 1 tablet (20 mEq total) by mouth daily.     vitamin C 500 MG tablet  Commonly known as:  ASCORBIC ACID  Take 500 mg by mouth 2 (two) times daily.        Major procedures and Radiology Reports - PLEASE review detailed and final reports for all details, in brief -    TTE   - Left ventricle: The cavity size was normal. Wall thickness wasincreased in a pattern of severe LVH. Systolic function wasnormal. The estimated ejection fraction was in the range of 55%to 60%. Wall motion was normal; there were no regional wallmotion abnormalities. - Aortic valve: Valve mobility was restricted. There was moderatestenosis. There was mild regurgitation. Valve area (VTI): 1.12cm^2. Valve area (Vmax): 1.12 cm^2. Valve area (Vmean): 1.1 cm^2. - Mitral valve: Calcified annulus. Mildly thickened leaflets .There was mild regurgitation. - Left atrium: The atrium was severely dilated. - Right atrium: The atrium was severely dilated. - Atrial septum: There was an atrial septal aneurysm. - Tricuspid valve: There was severe regurgitation. - Pulmonary arteries: Systolic pressure was severely increased. PApeak pressure: 82 mm Hg (S). (was 39 - 1 yr ago) - Pericardium, extracardiac: A trivial pericardial effusion wasidentified. There was a left pleural effusion.  Impressions - Normal LV function; severe LVH; severe biatrial enlargement;calcified aortic valve with moderate AS and mild AI; mild  MR;severe TR with severely elevated pulmonary pressure.   Bilateral lower extremity venous duplex . Preliminary report: There is no obvious evidence of DVT or SVT noted in the bilateral lower extremities. Waveforms are pulsatile, suggestive of fluid overload.    Nm Pulmonary Perf And Vent  11/21/2014   CLINICAL DATA:  Chronic pulmonary hypertension related to severe tricuspid regurgitation. Patient also with current history of moderate aortic stenosis, chronic diastolic CHF, hypertension, stage 4 chronic kidney disease. Personal history of rheumatic heart disease.  EXAM: NUCLEAR MEDICINE VENTILATION - PERFUSION LUNG SCAN  TECHNIQUE: Ventilation images were obtained in multiple projections using inhaled aerosol Tc-34m DTPA. Perfusion images were obtained in multiple projections after intravenous injection of Tc-47m MAA.  RADIOPHARMACEUTICALS:  43.5 Technetium-64m DTPA aerosol inhalation and 6.0 Technetium-39m MAA IV.  COMPARISON:  No prior nuclear imaging. Chest x-ray subsequently same date is correlated.  FINDINGS: Ventilation: Slightly diminished segmental ventilation defect in the left lung apex relative to the right lung apex. No ventilatory abnormality elsewhere. Deposition of aerosol in the central airways.  Perfusion: Matched segmental perfusion defect in the left lung apex. No profusion abnormality elsewhere.  The chest x-ray obtained subsequently demonstrates no focal parenchymal abnormality in the left lung apex.  IMPRESSION: Solitary matched segmental defect in the left lung apex, indicating low probability of pulmonary embolism by PIOPED II criteria.   Electronically Signed   By: Hulan Saas M.D.   On: 11/21/2014 16:45   Dg Chest Port 1 View  11/21/2014   CLINICAL DATA:  Acute superimposed upon chronic diastolic heart failure. Personal history of rheumatic heart disease leading to a current history of severe tricuspid regurgitation, pulmonary hypertension and moderate aortic stenosis.   EXAM: PORTABLE CHEST - 1 VIEW  COMPARISON:  11/18/2014 and earlier.  FINDINGS: Cardiac silhouette markedly enlarged, unchanged. Interval worsening of now moderate interstitial and airspace pulmonary edema bilaterally. Moderate size right pleural effusion, increased in size, with associated passive atelectasis in the right lower lobe.  IMPRESSION: Worsening CHF with moderate diffuse interstitial and airspace pulmonary edema. Enlarging right pleural effusion and associated passive atelectasis in the right lower lobe.   Electronically Signed   By: Hulan Saas M.D.   On: 11/21/2014 16:47   Dg Chest Port 1 View  11/18/2014   CLINICAL DATA:  Lower extremity edema for 3 weeks  EXAM: PORTABLE CHEST - 1 VIEW  COMPARISON:  September 25, 2014  FINDINGS: There is stable cardiomegaly. There is less edema compared to prior study. Currently there is trace interstitial edema. There is a minimal right effusion. There is no airspace consolidation. No adenopathy. There is atherosclerotic change in aorta. There are small cervical ribs bilaterally.  IMPRESSION: There is a degree of congestive heart failure, although there is significantly less edema compared to prior study. No airspace consolidation. No adenopathy.   Electronically Signed   By: Bretta Bang III M.D.   On: 11/18/2014 21:34    Micro Results      Recent Results (from the past 240 hour(s))  MRSA PCR Screening     Status: Abnormal   Collection Time: 11/18/14 11:00 PM  Result Value Ref Range Status   MRSA by PCR POSITIVE (A) NEGATIVE Final    Comment:        The GeneXpert MRSA Assay (FDA approved for NASAL specimens only), is one component of a comprehensive MRSA colonization surveillance program. It is not intended to diagnose MRSA infection nor to guide or monitor treatment for MRSA infections. RESULT CALLED TO, READ BACK BY AND VERIFIED WITH: R.HANEY,RN 0148 11/19/14 M.CAMPBELL     Today   Subjective    Isa Micalizzi today has no  headache,no chest abdominal pain,no new weakness tingling or numbness, feels much better wants to go home today.     Objective   Blood pressure 111/51, pulse 61, temperature 97 F (36.1 C), temperature source Oral, resp. rate 18, height 5\' 4"  (1.626 m), weight 85 kg (187 lb 6.3 oz), SpO2 97 %.   Intake/Output Summary (Last 24 hours) at 11/23/14 1012 Last data filed at 11/23/14 0837  Gross per 24 hour  Intake    203 ml  Output   1900 ml  Net  -1697 ml    Exam Awake Alert, Oriented x 3, No new F.N deficits, Normal affect [place].AT,PERRAL Supple Neck,No JVD, No cervical lymphadenopathy appriciated.  Symmetrical Chest wall movement, Good air movement bilaterally, CTAB RRR,No Gallops,Rubs or new Murmurs, No Parasternal Heave +ve B.Sounds, Abd Soft, Non tender, No organomegaly appriciated, No rebound -guarding or rigidity. No Cyanosis, Clubbing or edema, No new Rash or bruise   Data Review   CBC w Diff: Lab Results  Component Value Date   WBC 5.3 11/18/2014   WBC 4.6 11/16/2013   HGB 9.2* 11/18/2014   HGB 9.5* 11/16/2013   HCT 28.1* 11/18/2014   HCT 30.5* 11/16/2013   PLT 168 11/18/2014   LYMPHOPCT 23 11/18/2014   MONOPCT 14* 11/18/2014   EOSPCT 4 11/18/2014   BASOPCT 1 11/18/2014    CMP: Lab Results  Component Value Date   NA 140 11/21/2014   NA 140 03/29/2014   K 4.1 11/21/2014   CL 105 11/21/2014   CO2 24 11/21/2014   BUN 115* 11/21/2014   BUN 70* 03/29/2014  CREATININE 2.35* 11/21/2014   CREATININE 2.47* 06/22/2014   PROT 6.5 09/23/2014   ALBUMIN 2.8* 09/23/2014   BILITOT 1.1 09/23/2014   ALKPHOS 86 09/23/2014   AST 12* 09/23/2014   ALT 9* 09/23/2014  .   Total Time in preparing paper work, data evaluation and todays exam - 35 minutes  Leroy Sea M.D on 11/23/2014 at 10:12 AM  [facility]   Office  [phone]

## 2014-11-23 NOTE — Progress Notes (Signed)
Pt a/o, no c/o pain, pt being dc to home with hospice pt will be leaving via ambulance, pt stable

## 2014-11-23 NOTE — Plan of Care (Signed)
Problem: Phase I Progression Outcomes Goal: EF % per last Echo/documented,Core Reminder form on chart Outcome: Completed/Met Date Met:  11/23/14 Echo performed on 11/20/2014 EF result - 55-60% Goal: Up in chair, BRP Outcome: Completed/Met Date Met:  11/23/14 Patient up with two person assist Goal: Voiding-avoid urinary catheter unless indicated Outcome: Not Applicable Date Met:  85/46/27 Patient has chronic foley catheter- End of life care and aggressive diuresis

## 2014-12-20 ENCOUNTER — Non-Acute Institutional Stay (SKILLED_NURSING_FACILITY): Payer: Medicare Other | Admitting: Internal Medicine

## 2014-12-20 DIAGNOSIS — K219 Gastro-esophageal reflux disease without esophagitis: Secondary | ICD-10-CM

## 2014-12-20 DIAGNOSIS — M1990 Unspecified osteoarthritis, unspecified site: Secondary | ICD-10-CM

## 2014-12-20 DIAGNOSIS — N184 Chronic kidney disease, stage 4 (severe): Secondary | ICD-10-CM

## 2014-12-20 DIAGNOSIS — I25119 Atherosclerotic heart disease of native coronary artery with unspecified angina pectoris: Secondary | ICD-10-CM

## 2014-12-20 DIAGNOSIS — I5033 Acute on chronic diastolic (congestive) heart failure: Secondary | ICD-10-CM | POA: Diagnosis not present

## 2014-12-20 NOTE — Progress Notes (Signed)
MRN: [id number] Name: Meghan Russo  Sex: female Age: 81 y.o. DOB: 17-Feb-1921  PSC #: [facility] Facility/Room:326 Level Of Care: SNF Provider: Merrilee Seashore D Emergency Contacts: Extended Emergency Contact Information Primary Emergency Contact: Lyles,Barbara Address: [address]          [place] Phone: [phone] Relation: Daughter Secondary Emergency Contact: Mccorry,Jennifer  [place] Home Phone: [phone] Relation: Daughter  Code Status:   Allergies: Psychiatric nurse Complaint  Patient presents with  . New Admit To SNF    HPI: Patient is 81 y.o. female with PMH of hypertension, diastolic CHF (EF 55 to 60%), GIB, Afib not on anticoagulation to due to hx of GI bleeding, arthritis, anemia, pericarditis, ascites, CKD stage IV, multiple valvular abnormalities, severe pulmonary hypertension who was hospitalized in 11/2014 for acute on chronic CHF and made a Hospice pt afterwards who is now admitted to SNF as a respite patient. While at SNF pt will be followed for CAD, tx with isosorbide, GERD, tx with protonix and arthritis tx with tylenol. As a Hospice pt all but essential or comfort medications would have been discontinued.  Past Medical History  Diagnosis Date  . Hypertension   . Moderate aortic stenosis 12/2013  . Moderate to severe pulmonary hypertension   . Arthritis     knee pain   . Bradycardia     a. 03/2014: HR 30's, sinus pauses and Wenkebach - BB discontinued.  . Valvular heart disease 2006    severe tricuspid regurge, moderate aortic insufficiency, mild mitral regurge  . Chronic diastolic CHF (congestive heart failure)   . Anemia 2005    Normocytic  . Osteoporosis 2005  . Pericarditis 03/2004  . Atrial fibrillation   . GIB (gastrointestinal bleeding)   . Chronic kidney disease, stage IV (severe)     Hattie Perch 11/18/2014  . Ascites     hx/notes 11/18/2014    Past Surgical History  Procedure Laterality  Date  . Varicose vein surgery Right   . Cataract extraction, bilateral Bilateral   . Inguinal lymph node biopsy Right 08/2011    non cancerous benign lymphoid tissue      Medication List       This list is accurate as of: 12/20/14 11:59 PM.  Always use your most recent med list.               acetaminophen 500 MG tablet  Commonly known as:  TYLENOL  Take 1,000 mg by mouth every 6 (six) hours as needed for moderate pain (pain).     feeding supplement (PRO-STAT SUGAR FREE 64) Liqd  Take 30 mLs by mouth daily at 3 pm.     furosemide 80 MG tablet  Commonly known as:  LASIX  Take 1 tablet (80 mg total) by mouth 3 (three) times daily.     HYDROcodone-acetaminophen 5-325 MG per tablet  Commonly known as:  NORCO/VICODIN  Take 1 tablet by mouth every 6 (six) hours as needed for moderate pain.     isosorbide dinitrate 20 MG tablet  Commonly known as:  ISORDIL  Take 1 tablet (20 mg total) by mouth 3 (three) times daily.     metolazone 5 MG tablet  Commonly known as:  ZAROXOLYN  Take 1 tablet (5 mg total) by mouth 3 (three) times a week. Take on Mon, Wed, and Frid     ONE-A-DAY 50 PLUS PO  Take 1 tablet by mouth daily at 3 pm.  pantoprazole 40 MG tablet  Commonly known as:  PROTONIX  Take 1 tablet (40 mg total) by mouth daily at 12 noon.     polyethylene glycol packet  Commonly known as:  MIRALAX / GLYCOLAX  Take 17 g by mouth daily.     potassium chloride SA 20 MEQ tablet  Commonly known as:  K-DUR,KLOR-CON  Take 1 tablet (20 mEq total) by mouth daily.     vitamin C 500 MG tablet  Commonly known as:  ASCORBIC ACID  Take 500 mg by mouth 2 (two) times daily.        No orders of the defined types were placed in this encounter.    Immunization History  Administered Date(s) Administered  . Influenza-Unspecified 01/14/2014  . Pneumococcal Polysaccharide-23 01/20/2014  . Tdap 06/14/2011    Social History  Substance Use Topics  . Smoking status: Never Smoker    . Smokeless tobacco: Never Used  . Alcohol Use: No    Family history is + HTN, CAD   Review of Systems  DATA OBTAINED: from patient, nurse, medical record GENERAL:  no fevers, fatigue, appetite changes SKIN: No itching, rash or wounds EYES: No eye pain, redness, discharge EARS: No earache, tinnitus, change in hearing NOSE: No congestion, drainage or bleeding  MOUTH/THROAT: No mouth or tooth pain, No sore throat RESPIRATORY: No cough, wheezing, SOB CARDIAC: No chest pain, palpitations, lower extremity edema  GI: No abdominal pain, No N/V/D or constipation, No heartburn or reflux  GU: No dysuria, frequency or urgency, or incontinence  MUSCULOSKELETAL: No unrelieved bone/joint pain NEUROLOGIC: No headache, dizziness or focal weakness PSYCHIATRIC: No c/o anxiety or sadness   Filed Vitals:   12/25/14 1420  BP: 142/76  Pulse: 58  Temp: 98.4 F (36.9 C)  Resp: 20    SpO2 Readings from Last 1 Encounters:  11/23/14 97%        Physical Exam  GENERAL APPEARANCE: Alert, conversant,  No acute distress.  SKIN: No diaphoresis rash HEAD: Normocephalic, atraumatic  EYES: Conjunctiva/lids clear. Pupils round, reactive. EOMs intact.  EARS: External exam WNL, canals clear. Hearing grossly normal.  NOSE: No deformity or discharge.  MOUTH/THROAT: Lips w/o lesions  RESPIRATORY: Breathing is even, unlabored. Lung sounds are clear   CARDIOVASCULAR: Heart RRR with 4/6 M, no rubs or gallops. No peripheral edema.   GASTROINTESTINAL: Abdomen is soft, non-tender, not distended w/ normal bowel sounds. GENITOURINARY: Bladder non tender, not distended  MUSCULOSKELETAL: No abnormal joints or musculature NEUROLOGIC:  Cranial nerves 2-12 grossly intact. Moves all extremities  PSYCHIATRIC: Mood and affect appropriate to situation with some  dementia, no behavioral issues  Patient Active Problem List   Diagnosis Date Noted  . CAD (coronary artery disease), native coronary artery 12/25/2014  .  Arthritis 12/25/2014  . Palliative care encounter   . DNR (do not resuscitate) discussion   . Pitting edema 11/18/2014  . CHF exacerbation 11/18/2014  . Decubitus ulcer of sacral region, stage 2 11/18/2014  . GERD (gastroesophageal reflux disease) 11/18/2014  . Ascites 11/18/2014  . Atrial fibrillation   . Chronic atrial fibrillation   . Acute blood loss anemia 09/24/2014  . Lower GI bleed   . Pressure ulcer 09/23/2014  . Rectal bleeding 09/22/2014  . GI bleeding 09/22/2014  . Bradycardia   . Anasarca 07/06/2014  . CKD (chronic kidney disease) stage 4, GFR 15-29 ml/min 07/02/2014  . Acute diastolic HF (heart failure), NYHA class 4 06/28/2014  . Hypertensive heart disease 03/29/2014  . Acute on  chronic diastolic CHF (congestive heart failure), NYHA class 4   . Hypertension   . Bradycardia by electrocardiogram - with sinus pauses & Wenkebach Block 03/18/2014  . Atrioventricular block, Mobitz type 1, Wenckebach: now BB stopped. 03/18/2014  . Acute on chronic diastolic heart failure 03/16/2014  . Bilateral leg edema 01/19/2014  . Anemia 01/19/2014  . Thrombocytopenia 01/19/2014  . History of rheumatic heart disease 01/19/2014  . Moderate to severe pulmonary hypertension 01/19/2014  . Severe tricuspid regurgitation 01/19/2014  . Mild mitral regurgitation 12/31/2013  . Moderate aortic insufficiency 12/31/2013  . Moderate aortic stenosis 12/11/2012  . Essential hypertension - labile 10/09/2012  . Gout due to renal impairment of multiple sites 10/09/2012  . Lymphadenopathy, inguinal - right 08/09/2011    CBC    Component Value Date/Time   WBC 5.3 11/18/2014 1907   WBC 4.6 11/16/2013 1908   RBC 3.00* 11/18/2014 1907   RBC 2.72* 01/20/2014 0359   RBC 2.98* 11/16/2013 1908   HGB 9.2* 11/18/2014 1907   HGB 9.5* 11/16/2013 1908   HCT 28.1* 11/18/2014 1907   HCT 30.5* 11/16/2013 1908   PLT 168 11/18/2014 1907   MCV 93.7 11/18/2014 1907   MCV 102.4* 11/16/2013 1908   LYMPHSABS  1.2 11/18/2014 1907   MONOABS 0.7 11/18/2014 1907   EOSABS 0.2 11/18/2014 1907   BASOSABS 0.0 11/18/2014 1907    CMP     Component Value Date/Time   NA 140 11/21/2014 0442   NA 140 03/29/2014 1146   K 4.1 11/21/2014 0442   CL 105 11/21/2014 0442   CO2 24 11/21/2014 0442   GLUCOSE 82 11/21/2014 0442   GLUCOSE 92 03/29/2014 1146   BUN 115* 11/21/2014 0442   BUN 70* 03/29/2014 1146   CREATININE 2.35* 11/21/2014 0442   CREATININE 2.47* 06/22/2014 1002   CALCIUM 8.2* 11/21/2014 0442   PROT 6.5 09/23/2014 0510   ALBUMIN 2.8* 09/23/2014 0510   AST 12* 09/23/2014 0510   ALT 9* 09/23/2014 0510   ALKPHOS 86 09/23/2014 0510   BILITOT 1.1 09/23/2014 0510   GFRNONAA 17* 11/21/2014 0442   GFRAA 20* 11/21/2014 0442    Lab Results  Component Value Date   HGBA1C 5.6 11/19/2014     Dg Chest Port 1 View  11/18/2014   CLINICAL DATA:  Lower extremity edema for 3 weeks  EXAM: PORTABLE CHEST - 1 VIEW  COMPARISON:  September 25, 2014  FINDINGS: There is stable cardiomegaly. There is less edema compared to prior study. Currently there is trace interstitial edema. There is a minimal right effusion. There is no airspace consolidation. No adenopathy. There is atherosclerotic change in aorta. There are small cervical ribs bilaterally.  IMPRESSION: There is a degree of congestive heart failure, although there is significantly less edema compared to prior study. No airspace consolidation. No adenopathy.   Electronically Signed   By: Bretta Bang III M.D.   On: 11/18/2014 21:34    Not all labs, radiology exams or other studies done during hospitalization come through on my EPIC note; however they are reviewed by me.    Assessment and Plan  Acute on chronic diastolic CHF (congestive heart failure), NYHA class 4 Continue lasix and potassium;as this is respite no labs planned  CAD (coronary artery disease), native coronary artery Cont imdur  GERD (gastroesophageal reflux disease) Cont  protonix  CKD (chronic kidney disease) stage 4, GFR 15-29 ml/min No labs planned;last reported Cr 2.4  Arthritis Tylenol prn and lortab for more severe  Time spent 35 min Margit Hanks, MD

## 2014-12-25 ENCOUNTER — Encounter: Payer: Self-pay | Admitting: Internal Medicine

## 2014-12-25 DIAGNOSIS — M1990 Unspecified osteoarthritis, unspecified site: Secondary | ICD-10-CM | POA: Insufficient documentation

## 2014-12-25 DIAGNOSIS — I2510 Atherosclerotic heart disease of native coronary artery without angina pectoris: Secondary | ICD-10-CM | POA: Insufficient documentation

## 2014-12-25 NOTE — Assessment & Plan Note (Signed)
Cont imdur

## 2014-12-25 NOTE — Assessment & Plan Note (Signed)
Continue lasix and potassium;as this is respite no labs planned

## 2014-12-25 NOTE — Assessment & Plan Note (Signed)
No labs planned;last reported Cr 2.4

## 2014-12-25 NOTE — Assessment & Plan Note (Signed)
Tylenol prn and lortab for more severe

## 2014-12-25 NOTE — Assessment & Plan Note (Signed)
Cont protonix

## 2015-02-01 DEATH — deceased

## 2016-01-11 IMAGING — CR DG CHEST 1V PORT
1 series · 1 of 1 positions shown · non-contrast
Comparison: 03/17/2014.

CLINICAL DATA: [AGE] female with acute shortness of Breath.
Initial encounter.

EXAM:
PORTABLE CHEST - 1 VIEW

[AP]
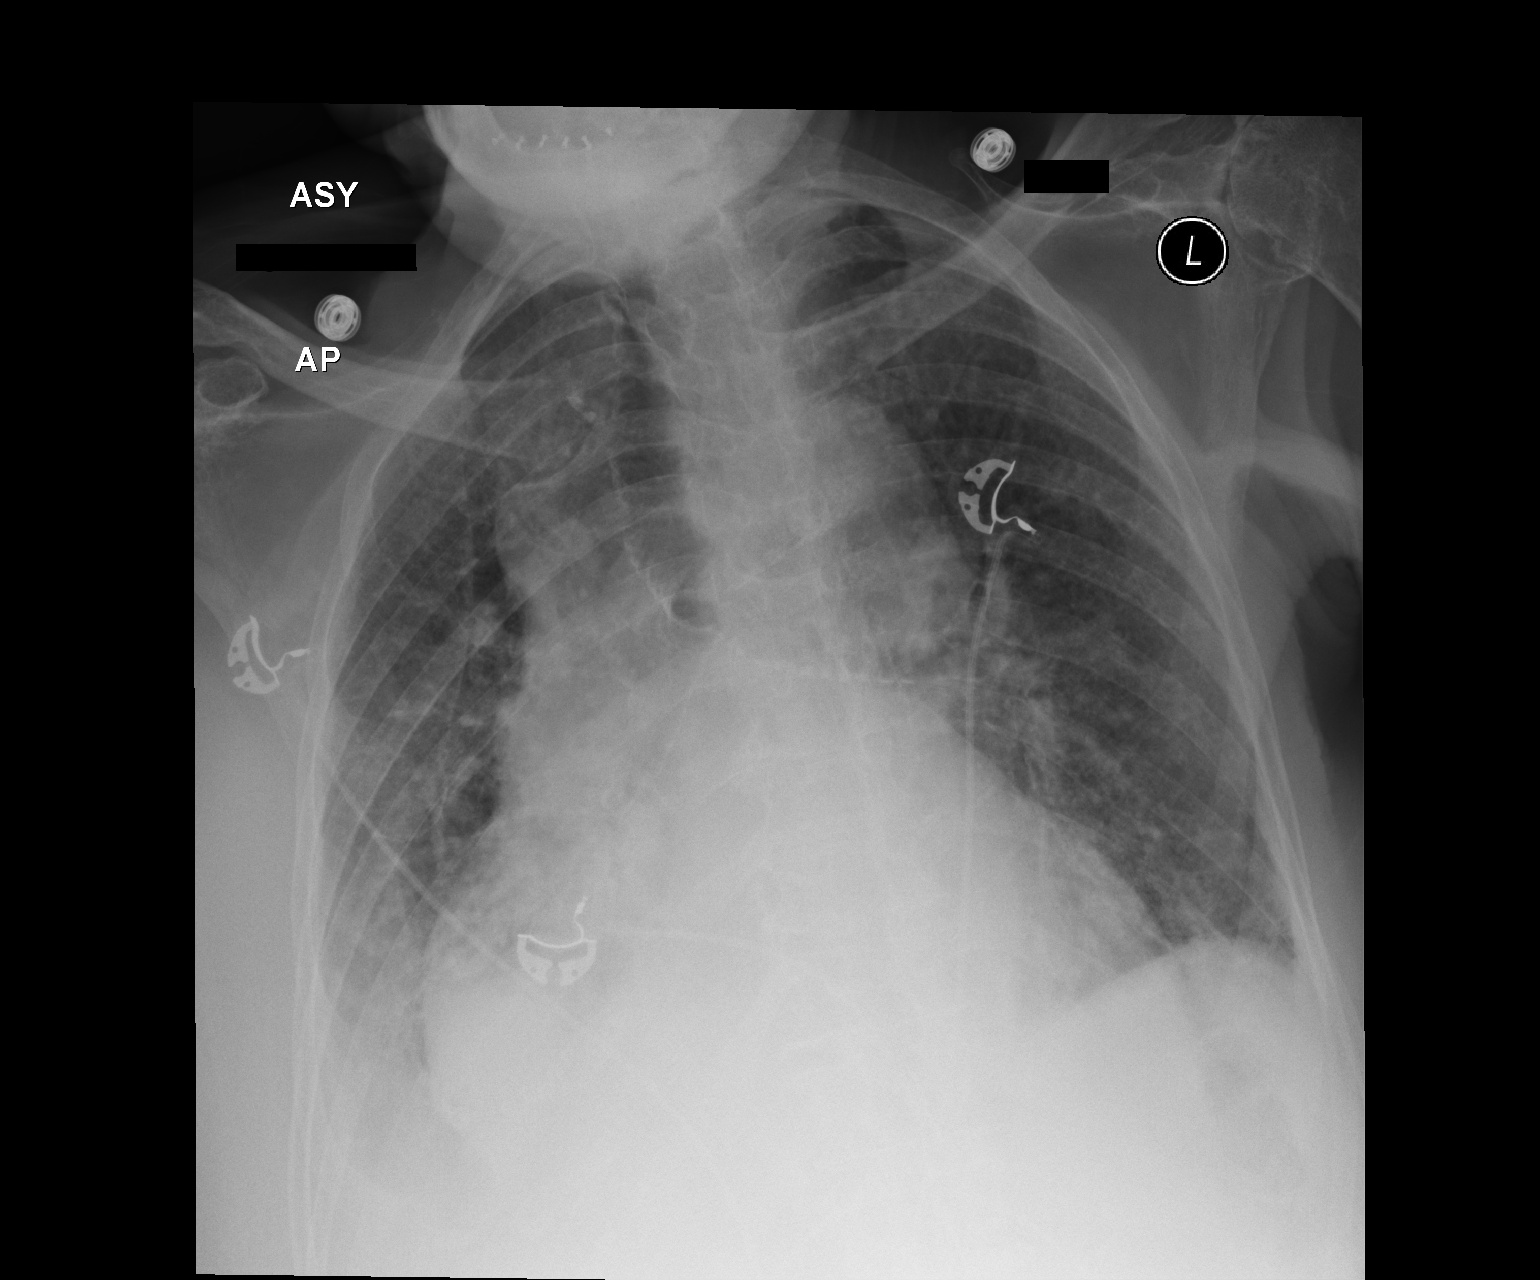

[1 of 1 positions shown; findings below may reference images not displayed]

FINDINGS: Portable AP semi upright view at 9810 hrs. The patient is now
rotated to the right. Stable cardiomegaly and mediastinal contours.
Mildly regressed bilateral veiling opacity. Pulmonary vascularity is
stable with suggestion of interstitial edema. No pneumothorax. No
air bronchograms identified.
IMPRESSION: Stable cardiomegaly. Stable pulmonary interstitial edema. Right
greater than left pleural effusions appear regressed since [DATE].

## 2016-04-09 IMAGING — CT CT ABD-PELV W/O CM
2 of 4 series · 17 of 46 positions shown, 19 images · non-contrast
Comparison: None.

CLINICAL DATA: Hematochezia with drop in hemoglobin. Question
underlying neoplasm. Possible diverticular bleed.

EXAM:
CT ABDOMEN AND PELVIS WITHOUT CONTRAST
TECHNIQUE: Multidetector CT imaging of the abdomen and pelvis was performed
following the standard protocol without IV contrast.

[Series 2: rtn a/p w/o · axial · non-contrast · 0.85mm/px · z∈[-507,-77]mm · 14 of 94 slices shown, 16 images]
[im 4/94  soft-tissue]
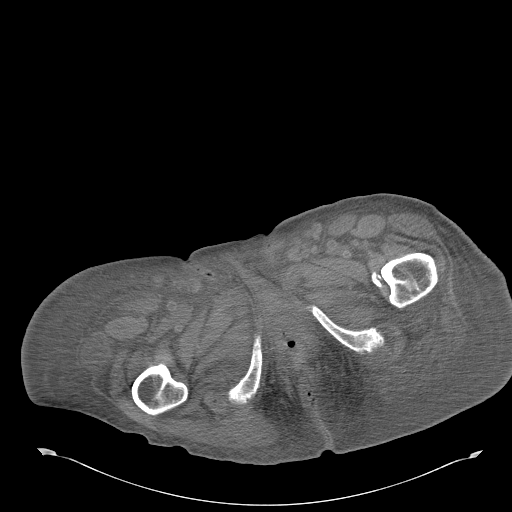
[im 4/94  bone]
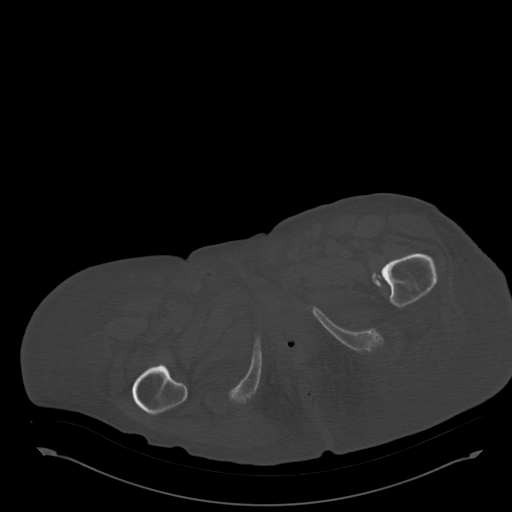
[im 12/94  soft-tissue]
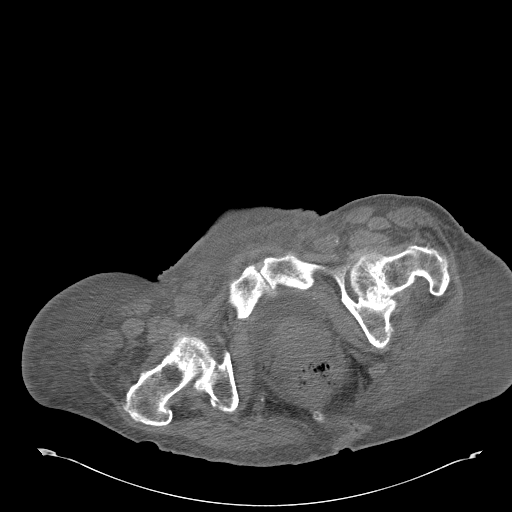
[im 20/94  soft-tissue]
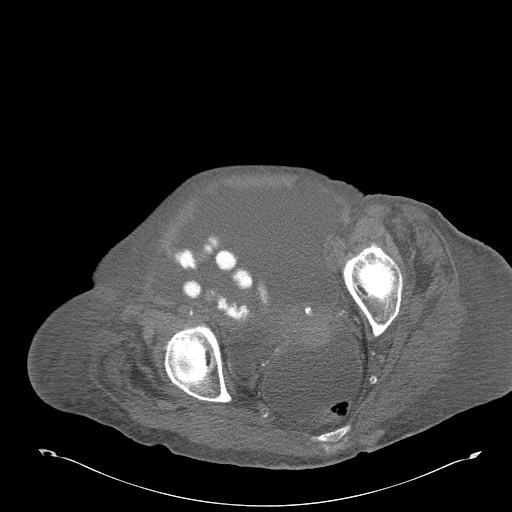
[im 24/94  soft-tissue]
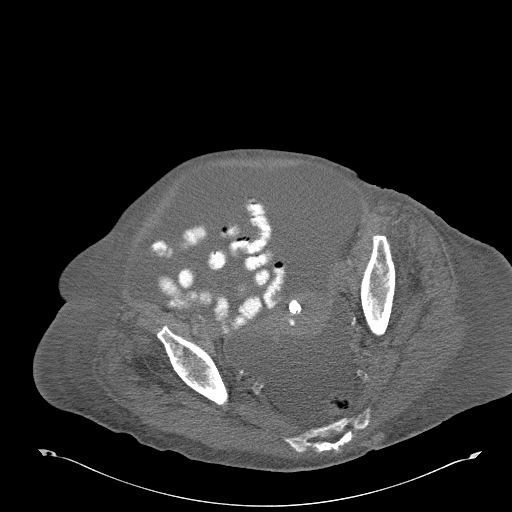
[im 32/94  soft-tissue]
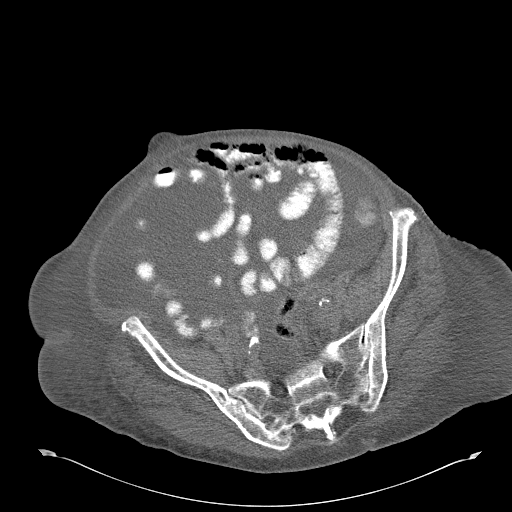
[im 39/94  soft-tissue]
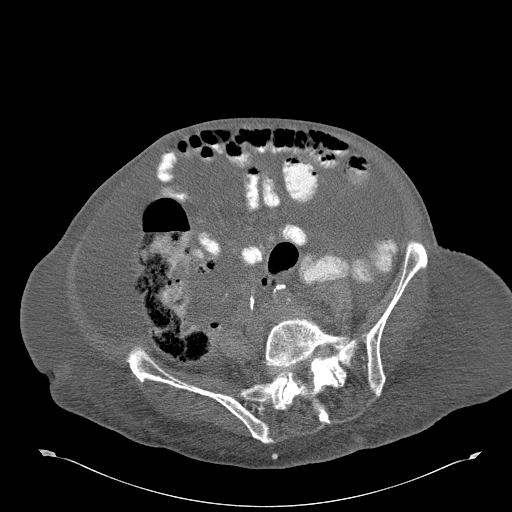
[im 43/94  soft-tissue]
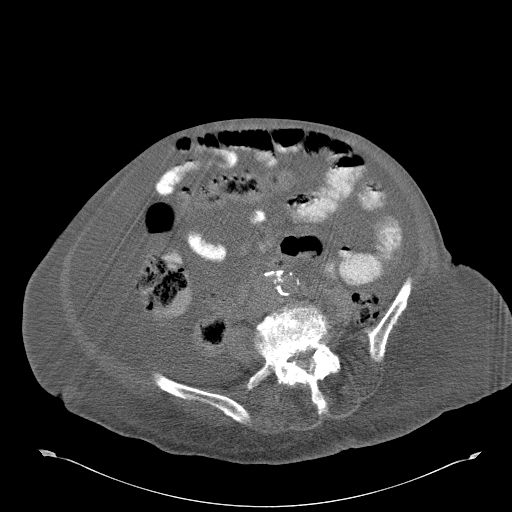
[im 51/94  soft-tissue]
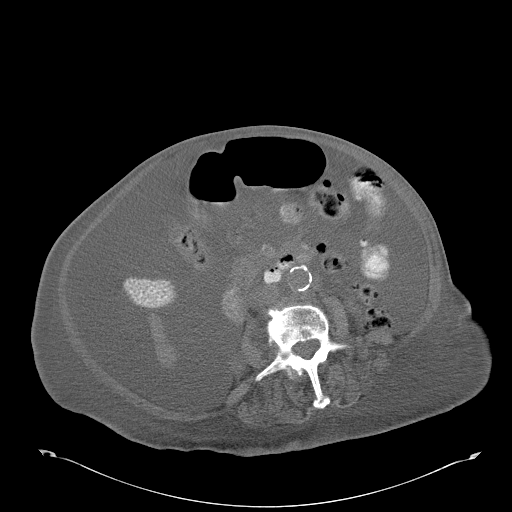
[im 55/94  soft-tissue]
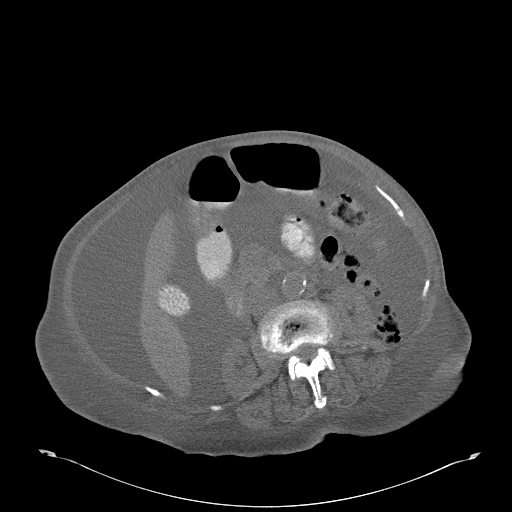
[im 55/94  bone]
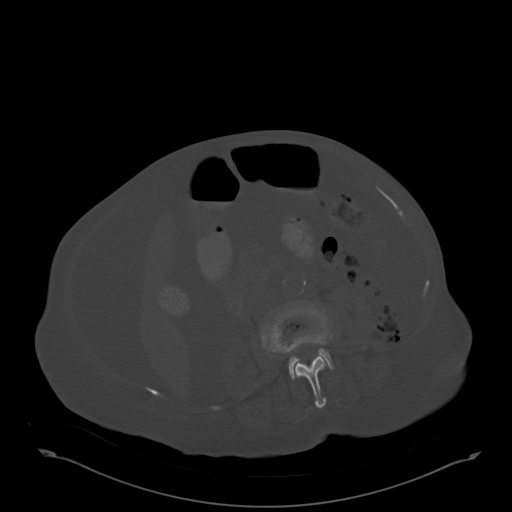
[im 63/94  soft-tissue]
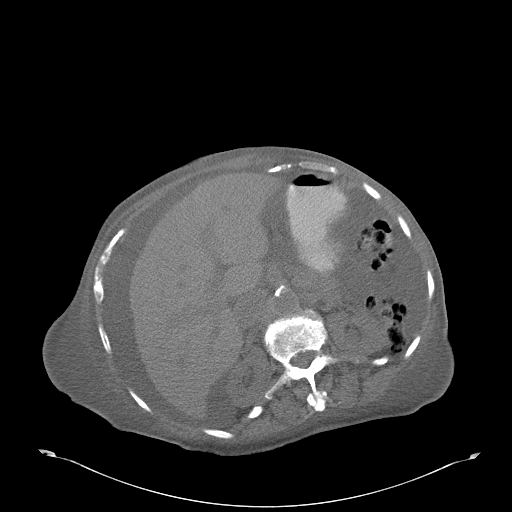
[im 70/94  soft-tissue]
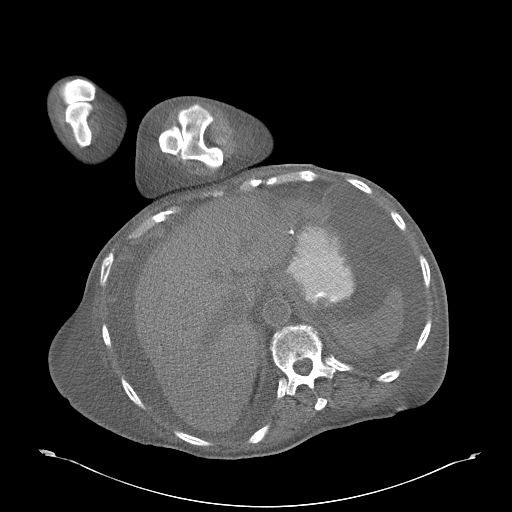
[im 74/94  soft-tissue]
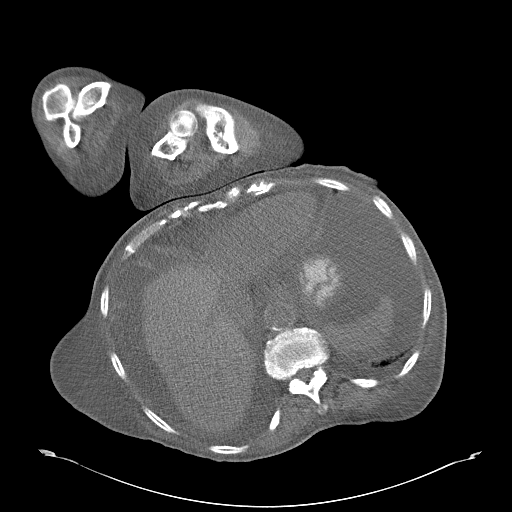
[im 82/94  soft-tissue]
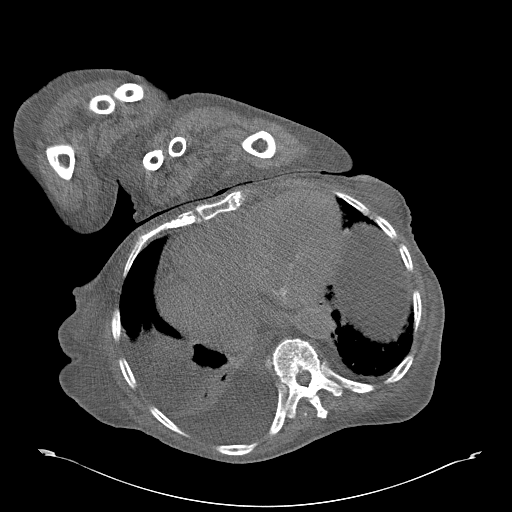
[im 90/94  soft-tissue]
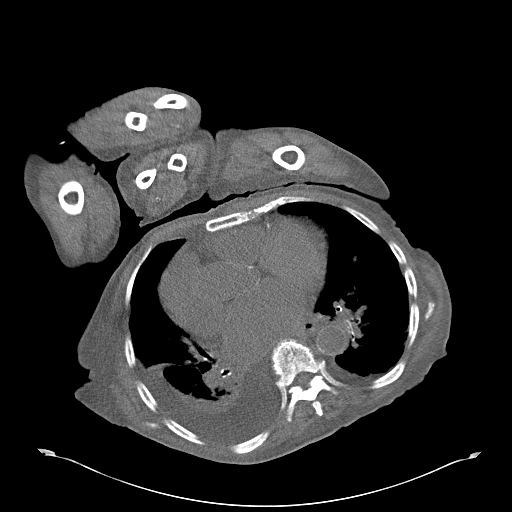

[Series 5: coronal · coronal · 0.94mm/px · 3 of 165 slices shown]
[im 55/165  soft-tissue]
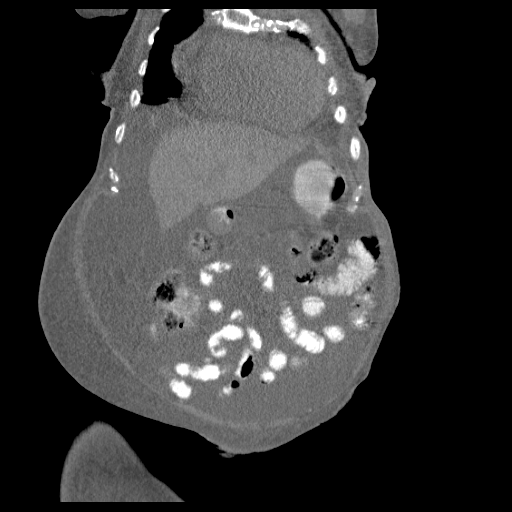
[im 73/165  soft-tissue]
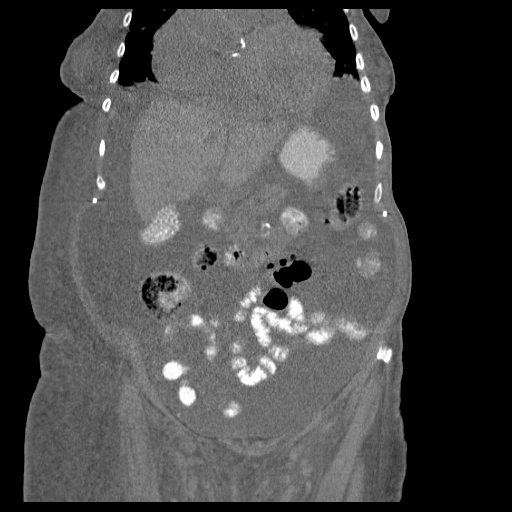
[im 92/165  soft-tissue]
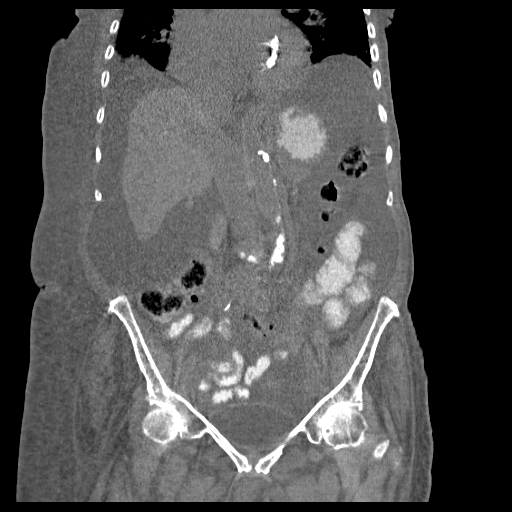

[17 of 46 positions shown; findings below may reference images not displayed]

FINDINGS: Lower chest: Bilateral pleural effusions. Right basilar atelectasis
or infiltrates. The heart is enlarged. Dense coronary artery
calcifications.

Upper abdomen: No focal abnormality in the spleen, pancreas, or
adrenal glands. The liver appears small and minimally nodular
raising the question of cirrhosis. Gallbladder contains numerous
stones which distend the wall.

Gastrointestinal tract: The stomach, small bowel loops have a normal
appearance. There are numerous colonic diverticula. Given the amount
of ascites, acute diverticulitis cannot be assessed. No evidence for
colonic mass or obstruction.

Pelvis: Uterus is present and contains numerous coarse
calcifications consistent with fibroids. There is a large amount of
ascites.

Retroperitoneum: The aorta is tortuous and calcified. No aneurysm.
No retroperitoneal or mesenteric adenopathy.

Abdominal wall: Diffuse anasarca.  Fat containing umbilical hernia.

Osseous structures: Degenerative changes are seen in the lower
thoracic and lumbar spine. Visualized osseous structures have a
normal appearance.
IMPRESSION: 1. Large amount of ascites.
2. Question of cirrhotic morphology of the liver.
3. Bilateral pleural effusions. Bibasilar atelectasis or
infiltrates.
4. Cardiomegaly and coronary artery disease.
5. Numerous colonic diverticula. Diverticulitis cannot be excluded
given the amount of ascites.
6. Multiple uterine fibroids.
7. Aortic atherosclerosis.
8. Anasarca.
9. Fat containing umbilical hernia.
